# Patient Record
Sex: Female | Born: 1970 | Race: White | Hispanic: No | Marital: Married | Smoking: Never smoker
Health system: Southern US, Community
[De-identification: ages and names within clinical notes are randomized; demographics above are authoritative.]

## PROBLEM LIST (undated history)

## (undated) DIAGNOSIS — I341 Nonrheumatic mitral (valve) prolapse: Secondary | ICD-10-CM

## (undated) DIAGNOSIS — T884XXA Failed or difficult intubation, initial encounter: Secondary | ICD-10-CM

## (undated) DIAGNOSIS — G43909 Migraine, unspecified, not intractable, without status migrainosus: Secondary | ICD-10-CM

## (undated) DIAGNOSIS — Z973 Presence of spectacles and contact lenses: Secondary | ICD-10-CM

## (undated) DIAGNOSIS — Z87898 Personal history of other specified conditions: Secondary | ICD-10-CM

## (undated) DIAGNOSIS — I350 Nonrheumatic aortic (valve) stenosis: Secondary | ICD-10-CM

## (undated) HISTORY — DX: Personal history of other specified conditions: Z87.898

## (undated) HISTORY — DX: Migraine, unspecified, not intractable, without status migrainosus: G43.909

## (undated) HISTORY — DX: Nonrheumatic mitral (valve) prolapse: I34.1

## (undated) HISTORY — PX: TONSILLECTOMY: SUR1361

## (undated) HISTORY — PX: APPENDECTOMY: SHX54

---

## 2011-02-02 ENCOUNTER — Ambulatory Visit: Payer: Self-pay

## 2011-07-29 ENCOUNTER — Ambulatory Visit: Payer: Self-pay | Admitting: Family Medicine

## 2011-10-19 ENCOUNTER — Ambulatory Visit: Payer: Self-pay | Admitting: Internal Medicine

## 2014-08-17 ENCOUNTER — Ambulatory Visit: Payer: Self-pay | Admitting: Family Medicine

## 2014-08-21 ENCOUNTER — Ambulatory Visit: Payer: Self-pay | Admitting: Cardiovascular Disease

## 2014-08-29 ENCOUNTER — Ambulatory Visit (INDEPENDENT_AMBULATORY_CARE_PROVIDER_SITE_OTHER): Payer: BC Managed Care – PPO | Admitting: Cardiovascular Disease

## 2014-08-29 ENCOUNTER — Encounter: Payer: Self-pay | Admitting: Cardiovascular Disease

## 2014-08-29 ENCOUNTER — Encounter (INDEPENDENT_AMBULATORY_CARE_PROVIDER_SITE_OTHER): Payer: Self-pay

## 2014-08-29 VITALS — BP 118/80 | HR 89 | Ht 62.0 in | Wt 137.0 lb

## 2014-08-29 DIAGNOSIS — G43809 Other migraine, not intractable, without status migrainosus: Secondary | ICD-10-CM

## 2014-08-29 DIAGNOSIS — R011 Cardiac murmur, unspecified: Secondary | ICD-10-CM

## 2014-08-29 DIAGNOSIS — R0789 Other chest pain: Secondary | ICD-10-CM | POA: Diagnosis not present

## 2014-08-29 NOTE — Assessment & Plan Note (Signed)
Managed by primary care. 

## 2014-08-29 NOTE — Progress Notes (Signed)
Patient ID: [name], female    DOB: 11-22-1970, 44 y.o.   MRN: [id number]  HPI Comments: [name] is a pleasant 44 year old woman who presents by referral from Dr. Gavin PottersGrandis for episodes of chest pain.  She for that over the past month or so she has noticed chest pain symptoms, sometimes at rest, sometimes with exertion. Chest pain is in the central part of her chest. She has noticed chest pain while on the treadmill.  Also noticed chest discomfort while at work. She is on her feet for long periods of time, working as a Engineer, civil (consulting)nurse at [employer].  She denies any lower extremity edema, no PND, orthopnea, lightheadedness.  Nonsmoker, no diabetes. In terms of her family history, no parents or siblings with premature coronary disease.  EKG on today's visit shows normal sinus rhythm with rate 85 bpm, no significant ST or T-wave changes  No Known Allergies  Outpatient Encounter Prescriptions as of 08/29/2014  Medication Sig  . ibuprofen (ADVIL,MOTRIN) 800 MG tablet Take 800 mg by mouth every 8 (eight) hours as needed.  . Multiple Vitamins tablet Take 1 tablet by mouth daily.   . ondansetron (ZOFRAN-ODT) 8 MG disintegrating tablet Take 8 mg by mouth every 8 (eight) hours as needed.   . ranitidine (ZANTAC) 300 MG capsule Take 300 mg by mouth every evening.     Past Medical History  Diagnosis Date  . History of palpitations   . Mitral valve prolapse   . Migraine     Past Surgical History  Procedure Laterality Date  . Appendectomy    . Tonsillectomy    . Cesarean section      Social History  reports that she has never smoked. She does not have any smokeless tobacco history on file. She reports that she does not drink alcohol or use illicit drugs.  Family History family history includes Heart failure in her father.    Review of Systems  Constitutional: Negative.   Respiratory: Negative.   Cardiovascular: Positive for chest pain.  Gastrointestinal: Negative.    Musculoskeletal: Negative.   Skin: Negative.   Neurological: Negative.   Hematological: Negative.   Psychiatric/Behavioral: Negative.   All other systems reviewed and are negative.   BP 118/80 mmHg  Pulse 89  Ht 5\' 2"  (1.575 m)  Wt 137 lb (62.143 kg)  BMI 25.05 kg/m2  Physical Exam  Constitutional: She is oriented to person, place, and time. She appears well-developed and well-nourished.  HENT:  Head: Normocephalic.  Nose: Nose normal.  Mouth/Throat: Oropharynx is clear and moist.  Eyes: Conjunctivae are normal. Pupils are equal, round, and reactive to light.  Neck: Normal range of motion. Neck supple. No JVD present.  Cardiovascular: Normal rate, regular rhythm, S1 normal, S2 normal and intact distal pulses.  Exam reveals no gallop and no friction rub.   Murmur heard.  Systolic murmur is present with a grade of 1/6  Pulmonary/Chest: Effort normal and breath sounds normal. No respiratory distress. She has no wheezes. She has no rales. She exhibits no tenderness.  Abdominal: Soft. Bowel sounds are normal. She exhibits no distension. There is no tenderness.  Musculoskeletal: Normal range of motion. She exhibits no edema or tenderness.  Lymphadenopathy:    She has no cervical adenopathy.  Neurological: She is alert and oriented to person, place, and time. Coordination normal.  Skin: Skin is warm and dry. No rash noted. No erythema.  Psychiatric: She has a normal mood and affect. Her behavior is normal. Judgment  and thought content normal.    Assessment and Plan  Nursing note and vitals reviewed.

## 2014-08-29 NOTE — Procedures (Signed)
Exercise Treadmill Test  Treadmill ordered for recent epsiodes of chest pain.  Resting EKG shows NSR with rate of 97 bpm, significant ST or T-wave changes Resting blood pressure 141/85  Stand bruce protocal was used.  Patient exercised for 9 min 00 sec,  Peak heart rate of 157 bpm.  This was 89% of the maximum predicted heart rate (176). Achieved 10.1 METS No symptoms of chest pain or lightheadedness were reported at peak stress or in recovery.  Peak Blood pressure recorded wa149/74 Heart rate at 3 minutes in recovery was 117 bpm. No ST changes concerning for ischemia   FINAL IMPRESSION: Normal exercise stress test. No significant EKG changes concerning for ischemia. Excellent exercise tolerance.

## 2014-08-29 NOTE — Assessment & Plan Note (Signed)
Atypical chest pain though periodically with symptoms on exertion.  We discussed the various treatment options. We have recommended a routine treadmill study as she is low risk. We will do this on her visit today If treadmill study is normal, chest tightness likely from other etiology such as GI or musculoskeletal.

## 2014-08-29 NOTE — Patient Instructions (Signed)
You are doing well. No medication changes were made.  We will order a routine treadmill study for chest pain  Please call us if you have new issues that need to be addressed before your next appt.

## 2014-08-29 NOTE — Patient Instructions (Signed)
Stress test is essentially normal with good exercise tolerance, no ischemia Etiology of your chest pain is likely noncardiac Would follow up with primary care

## 2014-08-29 NOTE — Assessment & Plan Note (Signed)
There is a low-grade 1 to 2/6 murmur notable at the right sternal border. She does report a history of mitral valve prolapse 26 years ago. Unable to exclude bicuspid aortic valve  echocardiogram order has been placed and she will do this at her convenience for new baseline

## 2014-09-05 ENCOUNTER — Telehealth: Payer: Self-pay

## 2014-09-05 NOTE — Telephone Encounter (Signed)
LMOM TO SEE IF PT WOULD LIKE TO SCHEDULE ECHO

## 2014-09-26 ENCOUNTER — Other Ambulatory Visit: Payer: BC Managed Care – PPO

## 2014-10-16 ENCOUNTER — Ambulatory Visit (INDEPENDENT_AMBULATORY_CARE_PROVIDER_SITE_OTHER): Payer: BC Managed Care – PPO

## 2014-10-16 ENCOUNTER — Other Ambulatory Visit: Payer: BC Managed Care – PPO

## 2014-10-16 ENCOUNTER — Other Ambulatory Visit: Payer: Self-pay

## 2014-10-16 DIAGNOSIS — R0789 Other chest pain: Secondary | ICD-10-CM

## 2014-10-16 DIAGNOSIS — R011 Cardiac murmur, unspecified: Secondary | ICD-10-CM | POA: Diagnosis not present

## 2016-04-06 ENCOUNTER — Observation Stay
Admission: EM | Admit: 2016-04-06 | Discharge: 2016-04-08 | Disposition: A | Discharge status: Home / Self Care | Payer: BC Managed Care – PPO | Attending: Internal Medicine | Admitting: Internal Medicine

## 2016-04-06 DIAGNOSIS — G43909 Migraine, unspecified, not intractable, without status migrainosus: Secondary | ICD-10-CM | POA: Insufficient documentation

## 2016-04-06 DIAGNOSIS — R1010 Upper abdominal pain, unspecified: Secondary | ICD-10-CM

## 2016-04-06 DIAGNOSIS — R110 Nausea: Secondary | ICD-10-CM

## 2016-04-06 DIAGNOSIS — R000 Tachycardia, unspecified: Secondary | ICD-10-CM | POA: Diagnosis not present

## 2016-04-06 DIAGNOSIS — R1013 Epigastric pain: Principal | ICD-10-CM

## 2016-04-06 DIAGNOSIS — E860 Dehydration: Secondary | ICD-10-CM | POA: Insufficient documentation

## 2016-04-06 DIAGNOSIS — R52 Pain, unspecified: Secondary | ICD-10-CM

## 2016-04-06 DIAGNOSIS — I341 Nonrheumatic mitral (valve) prolapse: Secondary | ICD-10-CM | POA: Insufficient documentation

## 2016-04-06 LAB — COMPREHENSIVE METABOLIC PANEL
ALBUMIN: 4 g/dL (ref 3.5–5.0)
ALT: 14 U/L (ref 14–54)
ANION GAP: 7 (ref 5–15)
AST: 24 U/L (ref 15–41)
Alkaline Phosphatase: 49 U/L (ref 38–126)
BUN: 6 mg/dL (ref 6–20)
CHLORIDE: 106 mmol/L (ref 101–111)
CO2: 24 mmol/L (ref 22–32)
Calcium: 8.8 mg/dL — ABNORMAL LOW (ref 8.9–10.3)
Creatinine, Ser: 0.57 mg/dL (ref 0.44–1.00)
GFR calc Af Amer: >60 mL/min (ref >60)
GFR calc non Af Amer: >60 mL/min (ref >60)
GLUCOSE: 116 mg/dL — AB (ref 65–99)
POTASSIUM: 3.3 mmol/L — AB (ref 3.5–5.1)
SODIUM: 137 mmol/L (ref 135–145)
TOTAL PROTEIN: 7.5 g/dL (ref 6.5–8.1)
Total Bilirubin: 0.4 mg/dL (ref 0.3–1.2)

## 2016-04-06 LAB — POCT PREGNANCY, URINE: PREG TEST UR: NEGATIVE

## 2016-04-06 LAB — CBC
HEMATOCRIT: 40.3 % (ref 35.0–47.0)
HEMOGLOBIN: 14.2 g/dL (ref 12.0–16.0)
MCH: 30.4 pg (ref 26.0–34.0)
MCHC: 35.3 g/dL (ref 32.0–36.0)
MCV: 86.2 fL (ref 80.0–100.0)
Platelets: 274 10*3/uL (ref 150–440)
RBC: 4.67 MIL/uL (ref 3.80–5.20)
RDW: 12.7 % (ref 11.5–14.5)
WBC: 8.9 10*3/uL (ref 3.6–11.0)

## 2016-04-06 LAB — URINALYSIS COMPLETE WITH MICROSCOPIC (ARMC ONLY)
Bilirubin Urine: NEGATIVE
Glucose, UA: NEGATIVE mg/dL
Ketones, ur: NEGATIVE mg/dL
Leukocytes, UA: NEGATIVE
NITRITE: NEGATIVE
PROTEIN: NEGATIVE mg/dL
SPECIFIC GRAVITY, URINE: 1.003 — AB (ref 1.005–1.030)
pH: 6 (ref 5.0–8.0)

## 2016-04-06 LAB — LIPASE, BLOOD: LIPASE: 22 U/L (ref 11–51)

## 2016-04-06 MED ORDER — ONDANSETRON HCL 4 MG/2ML IJ SOLN
4.0000 mg | Freq: Once | INTRAMUSCULAR | Status: AC
  Administered 2016-04-07: 4 mg via INTRAVENOUS
  Filled 2016-04-06: qty 2

## 2016-04-06 MED ORDER — MORPHINE SULFATE (PF) 2 MG/ML IV SOLN
4.0000 mg | Freq: Once | INTRAVENOUS | Status: AC
Start: 2016-04-06 — End: 2016-04-07
  Administered 2016-04-07: 4 mg via INTRAVENOUS
  Filled 2016-04-06: qty 2

## 2016-04-06 MED ORDER — SODIUM CHLORIDE 0.9 % IV BOLUS (SEPSIS)
1000.0000 mL | Freq: Once | INTRAVENOUS | Status: AC
  Administered 2016-04-07: 1000 mL via INTRAVENOUS

## 2016-04-06 NOTE — ED Triage Notes (Signed)
Patient reports abdominal pain nausea since Friday.

## 2016-04-06 NOTE — ED Notes (Signed)
Pt reports she just voided prior to coming to ED. UA cup at bedside. Pt aware of need for sample.

## 2016-04-07 ENCOUNTER — Observation Stay: Payer: BC Managed Care – PPO | Admitting: Anesthesiology

## 2016-04-07 ENCOUNTER — Emergency Department: Payer: BC Managed Care – PPO

## 2016-04-07 ENCOUNTER — Encounter
Admission: EM | Disposition: A | Discharge status: Home / Self Care | Payer: Self-pay | Source: Home / Self Care | Attending: Emergency Medicine

## 2016-04-07 DIAGNOSIS — R000 Tachycardia, unspecified: Secondary | ICD-10-CM | POA: Diagnosis present

## 2016-04-07 DIAGNOSIS — R1013 Epigastric pain: Secondary | ICD-10-CM | POA: Diagnosis not present

## 2016-04-07 DIAGNOSIS — R110 Nausea: Secondary | ICD-10-CM

## 2016-04-07 HISTORY — PX: ESOPHAGOGASTRODUODENOSCOPY (EGD) WITH PROPOFOL: SHX5813

## 2016-04-07 LAB — TSH: TSH: 1.657 u[IU]/mL (ref 0.350–4.500)

## 2016-04-07 LAB — LACTIC ACID, PLASMA: Lactic Acid, Venous: 0.7 mmol/L (ref 0.5–1.9)

## 2016-04-07 SURGERY — ESOPHAGOGASTRODUODENOSCOPY (EGD) WITH PROPOFOL
Anesthesia: General

## 2016-04-07 MED ORDER — SODIUM CHLORIDE 0.9% FLUSH
3.0000 mL | Freq: Two times a day (BID) | INTRAVENOUS | Status: DC
  Administered 2016-04-07 (×2): 3 mL via INTRAVENOUS

## 2016-04-07 MED ORDER — HYDROMORPHONE HCL 1 MG/ML IJ SOLN: 0.5000 mg | INTRAMUSCULAR | Status: DC | PRN

## 2016-04-07 MED ORDER — PROMETHAZINE HCL 25 MG PO TABS
12.5000 mg | ORAL_TABLET | Freq: Four times a day (QID) | ORAL | Status: DC | PRN
  Administered 2016-04-07: 12.5 mg via ORAL
  Filled 2016-04-07: qty 1

## 2016-04-07 MED ORDER — IOPAMIDOL (ISOVUE-300) INJECTION 61%
100.0000 mL | Freq: Once | INTRAVENOUS | Status: AC | PRN
  Administered 2016-04-07: 100 mL via INTRAVENOUS

## 2016-04-07 MED ORDER — POTASSIUM CHLORIDE IN NACL 40-0.9 MEQ/L-% IV SOLN
INTRAVENOUS | Status: DC
  Administered 2016-04-07 – 2016-04-08 (×4): 125 mL/h via INTRAVENOUS
  Filled 2016-04-07 (×6): qty 1000

## 2016-04-07 MED ORDER — ADULT MULTIVITAMIN W/MINERALS CH
1.0000 | ORAL_TABLET | Freq: Every day | ORAL | Status: DC
  Administered 2016-04-08: 1 via ORAL
  Filled 2016-04-07 (×2): qty 1

## 2016-04-07 MED ORDER — PROPOFOL 500 MG/50ML IV EMUL
INTRAVENOUS | Status: DC | PRN
  Administered 2016-04-07: 140 ug/kg/min via INTRAVENOUS

## 2016-04-07 MED ORDER — MORPHINE SULFATE (PF) 2 MG/ML IV SOLN
4.0000 mg | Freq: Once | INTRAVENOUS | Status: AC
  Administered 2016-04-07: 4 mg via INTRAVENOUS
  Filled 2016-04-07: qty 2

## 2016-04-07 MED ORDER — FAMOTIDINE 20 MG PO TABS
40.0000 mg | ORAL_TABLET | Freq: Every day | ORAL | Status: DC
Start: 2016-04-07 — End: 2016-04-07
  Filled 2016-04-07: qty 2

## 2016-04-07 MED ORDER — ONDANSETRON HCL 4 MG/2ML IJ SOLN
4.0000 mg | Freq: Four times a day (QID) | INTRAMUSCULAR | Status: DC | PRN
  Administered 2016-04-07 (×2): 4 mg via INTRAVENOUS
  Filled 2016-04-07: qty 2

## 2016-04-07 MED ORDER — FENTANYL CITRATE (PF) 100 MCG/2ML IJ SOLN
INTRAMUSCULAR | Status: DC | PRN
  Administered 2016-04-07: 50 ug via INTRAVENOUS

## 2016-04-07 MED ORDER — METRONIDAZOLE 500 MG PO TABS
500.0000 mg | ORAL_TABLET | Freq: Three times a day (TID) | ORAL | Status: AC
  Administered 2016-04-07 – 2016-04-08 (×3): 500 mg via ORAL
  Filled 2016-04-07 (×3): qty 1

## 2016-04-07 MED ORDER — HYDROMORPHONE HCL 1 MG/ML IJ SOLN
0.5000 mg | INTRAMUSCULAR | Status: DC | PRN
  Administered 2016-04-07: 14:00:00 0.5 mg via INTRAVENOUS
  Filled 2016-04-07: qty 1

## 2016-04-07 MED ORDER — ENOXAPARIN SODIUM 40 MG/0.4ML ~~LOC~~ SOLN
40.0000 mg | SUBCUTANEOUS | Status: DC
  Filled 2016-04-07: qty 0.4

## 2016-04-07 MED ORDER — ONDANSETRON HCL 4 MG/2ML IJ SOLN
INTRAMUSCULAR | Status: AC
  Filled 2016-04-07: qty 2

## 2016-04-07 MED ORDER — CIPROFLOXACIN IN D5W 400 MG/200ML IV SOLN
400.0000 mg | Freq: Once | INTRAVENOUS | Status: AC
  Administered 2016-04-07: 400 mg via INTRAVENOUS
  Filled 2016-04-07: qty 200

## 2016-04-07 MED ORDER — FAMOTIDINE IN NACL 20-0.9 MG/50ML-% IV SOLN
20.0000 mg | Freq: Two times a day (BID) | INTRAVENOUS | Status: DC
  Administered 2016-04-07 – 2016-04-08 (×3): 20 mg via INTRAVENOUS
  Filled 2016-04-07 (×4): qty 50

## 2016-04-07 MED ORDER — ONDANSETRON HCL 4 MG/2ML IJ SOLN
4.0000 mg | Freq: Once | INTRAMUSCULAR | Status: AC
  Administered 2016-04-07: 4 mg via INTRAVENOUS
  Filled 2016-04-07: qty 2

## 2016-04-07 MED ORDER — IOPAMIDOL (ISOVUE-300) INJECTION 61%
30.0000 mL | Freq: Once | INTRAVENOUS | Status: AC | PRN
  Administered 2016-04-07: 30 mL via ORAL

## 2016-04-07 MED ORDER — DOCUSATE SODIUM 100 MG PO CAPS
100.0000 mg | ORAL_CAPSULE | Freq: Two times a day (BID) | ORAL | Status: DC
  Administered 2016-04-07: 21:00:00 100 mg via ORAL
  Filled 2016-04-07 (×3): qty 1

## 2016-04-07 MED ORDER — SODIUM CHLORIDE 0.9 % IV BOLUS (SEPSIS)
1000.0000 mL | Freq: Once | INTRAVENOUS | Status: AC
  Administered 2016-04-07: 1000 mL via INTRAVENOUS

## 2016-04-07 MED ORDER — MIDAZOLAM HCL 2 MG/2ML IJ SOLN
INTRAMUSCULAR | Status: DC | PRN
  Administered 2016-04-07: 1 mg via INTRAVENOUS

## 2016-04-07 MED ORDER — BUTALBITAL-APAP-CAFFEINE 50-325-40 MG PO TABS
1.0000 | ORAL_TABLET | ORAL | Status: DC | PRN
  Administered 2016-04-07 (×2): 1 via ORAL
  Filled 2016-04-07 (×4): qty 1

## 2016-04-07 MED ORDER — ACETAMINOPHEN 650 MG RE SUPP
650.0000 mg | Freq: Four times a day (QID) | RECTAL | Status: DC | PRN
Start: 2016-04-07 — End: 2016-04-08

## 2016-04-07 MED ORDER — ONDANSETRON HCL 4 MG PO TABS: 4.0000 mg | ORAL_TABLET | Freq: Four times a day (QID) | ORAL | Status: DC | PRN

## 2016-04-07 MED ORDER — FAMOTIDINE IN NACL 20-0.9 MG/50ML-% IV SOLN
20.0000 mg | Freq: Once | INTRAVENOUS | Status: AC
  Administered 2016-04-07: 20 mg via INTRAVENOUS
  Filled 2016-04-07: qty 50

## 2016-04-07 MED ORDER — ACETAMINOPHEN 325 MG PO TABS: 650.0000 mg | ORAL_TABLET | Freq: Four times a day (QID) | ORAL | Status: DC | PRN

## 2016-04-07 MED ORDER — SODIUM CHLORIDE 0.9 % IV SOLN
INTRAVENOUS | Status: DC
  Administered 2016-04-07: 1000 mL via INTRAVENOUS

## 2016-04-07 MED ORDER — HYDROMORPHONE HCL 1 MG/ML IJ SOLN
INTRAMUSCULAR | Status: AC
  Filled 2016-04-07: qty 1

## 2016-04-07 MED ORDER — HYDROMORPHONE HCL 1 MG/ML IJ SOLN
0.5000 mg | Freq: Once | INTRAMUSCULAR | Status: AC
  Administered 2016-04-07: 0.5 mg via INTRAVENOUS

## 2016-04-07 MED ORDER — ONDANSETRON HCL 4 MG/2ML IJ SOLN
4.0000 mg | Freq: Once | INTRAMUSCULAR | Status: AC
  Administered 2016-04-07: 4 mg via INTRAVENOUS

## 2016-04-07 MED ORDER — CIPROFLOXACIN HCL 500 MG PO TABS
500.0000 mg | ORAL_TABLET | Freq: Two times a day (BID) | ORAL | Status: AC
  Administered 2016-04-07 – 2016-04-08 (×2): 500 mg via ORAL
  Filled 2016-04-07 (×2): qty 1

## 2016-04-07 MED ORDER — METRONIDAZOLE IN NACL 5-0.79 MG/ML-% IV SOLN
500.0000 mg | Freq: Once | INTRAVENOUS | Status: AC
  Administered 2016-04-07: 07:00:00 500 mg via INTRAVENOUS
  Filled 2016-04-07: qty 100

## 2016-04-07 MED ORDER — LIDOCAINE HCL (CARDIAC) 20 MG/ML IV SOLN
INTRAVENOUS | Status: DC | PRN
  Administered 2016-04-07: 60 mg via INTRAVENOUS

## 2016-04-07 NOTE — Transfer of Care (Signed)
Immediate Anesthesia Transfer of Care Note  Patient: [name]  Procedure(s) Performed: Procedure(s): ESOPHAGOGASTRODUODENOSCOPY (EGD) WITH PROPOFOL (N/A)  Patient Location: PACU  Anesthesia Type:General  Level of Consciousness: sedated  Airway & Oxygen Therapy: Patient Spontanous Breathing and Patient connected to nasal cannula oxygen  Post-op Assessment: Report given to RN and Post -op Vital signs reviewed and stable  Post vital signs: Reviewed and stable  Last Vitals:  Vitals:   04/07/16 0547 04/07/16 1058  BP: 109/60 125/69  Pulse: (!) 108 (!) 106  Resp: 18 16  Temp: 36.8 C 37.7 C    Last Pain:  Vitals:   04/07/16 1058  TempSrc: Tympanic  PainSc:          Complications: No apparent anesthesia complications

## 2016-04-07 NOTE — Op Note (Signed)
[facility] Gastroenterology Patient Name: Leslie Pollocklizabeth Mack Procedure Date: 04/07/2016 11:14 AM MRN: [id number] Account #: [id number] Date of Birth: 10-14-1970 Admit Type: Inpatient Age: 45 [facility] ENDO ROOM 2 Gender: Female Note Status: Finalized Procedure:            Upper GI endoscopy Indications:          Epigastric abdominal pain Providers:            Midge Miniumarren Cleda Imel MD, MD Referring MD:         Letitia CaulHeidi M. Grandis, MD (Referring MD) Medicines:            Propofol per Anesthesia Complications:        No immediate complications. Procedure:            Pre-Anesthesia Assessment:                       - Prior to the procedure, a History and Physical was                        performed, and patient medications and allergies were                        reviewed. The patient's tolerance of previous                        anesthesia was also reviewed. The risks and benefits of                        the procedure and the sedation options and risks were                        discussed with the patient. All questions were                        answered, and informed consent was obtained. Prior                        Anticoagulants: The patient has taken no previous                        anticoagulant or antiplatelet agents. ASA Grade                        Assessment: II - A patient with mild systemic disease.                        After reviewing the risks and benefits, the patient was                        deemed in satisfactory condition to undergo the                        procedure.                       After obtaining informed consent, the endoscope was                        passed under direct vision. Throughout the procedure,  the patient's blood pressure, pulse, and oxygen                        saturations were monitored continuously. The                        Colonoscope was introduced through the mouth, and        advanced to the second part of duodenum. The upper GI                        endoscopy was accomplished without difficulty. The                        patient tolerated the procedure well. Findings:      The examined esophagus was normal.      The stomach was normal.      The examined duodenum was normal. Impression:           - Normal esophagus.                       - Normal stomach.                       - Normal examined duodenum.                       - No specimens collected. Recommendation:       - Return patient to hospital ward for ongoing care. Procedure Code(s):    --- Professional ---                       [phone], Esophagogastroduodenoscopy, flexible, transoral;                        diagnostic, including collection of specimen(s) by                        brushing or washing, when performed (separate procedure) Diagnosis Code(s):    --- Professional ---                       R10.13, Epigastric pain CPT copyright 2016 American Medical Association. All rights reserved. The codes documented in this report are preliminary and upon coder review may  be revised to meet current compliance requirements. Midge Miniumarren Deaja Rizo MD, MD 04/07/2016 11:35:49 AM This report has been signed electronically. Number of Addenda: 0 Note Initiated On: 04/07/2016 11:14 AM      [facility]

## 2016-04-07 NOTE — ED Provider Notes (Signed)
[facility] Emergency Department Provider Note   ____________________________________________   First MD Initiated Contact with Patient 04/06/16 2317     (approximate)  I have reviewed the triage vital signs and the nursing notes.   HISTORY  Chief Complaint Abdominal Pain    HPI [name] is a 45 y.o. female who presents to the ED from home with a chief complaint of abdominal pain and nausea. Patient reports onset of upper abdominal cramping like pain 3 days ago. Pain is waxing/waning and does not seem to be exacerbated by anything. Symptoms are associated with nausea only. Patient denies recent fever, chills, chest pain, shortness of breath, vomiting, dysuria, diarrhea. Denies recent travel or trauma. Nothing makes her symptoms better or worse.   Past Medical History:  Diagnosis Date  . History of palpitations   . Migraine   . Mitral valve prolapse     Patient Active Problem List   Diagnosis Date Noted  . Tachycardia 04/07/2016  . Chest pressure 08/29/2014  . Murmur 08/29/2014  . Other migraine without status migrainosus, not intractable 08/29/2014    Past Surgical History:  Procedure Laterality Date  . APPENDECTOMY    . CESAREAN SECTION    . TONSILLECTOMY      Prior to Admission medications   Medication Sig Start Date End Date Taking? Authorizing Provider  acetaminophen (TYLENOL) 325 MG tablet Take 650 mg by mouth every 6 (six) hours as needed for mild pain or headache.   Yes Historical Provider, MD  ibuprofen (ADVIL,MOTRIN) 200 MG tablet Take 400 mg by mouth every 6 (six) hours as needed for headache or mild pain.   Yes Historical Provider, MD  Multiple Vitamins tablet Take 1 tablet by mouth daily.    Yes Historical Provider, MD  ondansetron (ZOFRAN-ODT) 8 MG disintegrating tablet Take 8 mg by mouth every 8 (eight) hours as needed for nausea or vomiting.  08/04/14  Yes Historical Provider, MD  rizatriptan (MAXALT) 10 MG tablet  Take 10 mg by mouth as needed for migraine. May repeat in 2 hours if needed   Yes Historical Provider, MD    Allergies Patient has no known allergies.  Family History  Problem Relation Age of Onset  . Heart failure Father     Social History Social History  Substance Use Topics  . Smoking status: Never Smoker  . Smokeless tobacco: Never Used  . Alcohol use No    Review of Systems  Constitutional: No fever/chills. Eyes: No visual changes. ENT: No sore throat. Cardiovascular: Denies chest pain. Respiratory: Denies shortness of breath. Gastrointestinal: Positive for abdominal pain and nausea, no vomiting.  No diarrhea.  No constipation. Genitourinary: Negative for dysuria. Musculoskeletal: Negative for back pain. Skin: Negative for rash. Neurological: Negative for headaches, focal weakness or numbness.  10-point ROS otherwise negative.  ____________________________________________   PHYSICAL EXAM:  VITAL SIGNS: ED Triage Vitals  Enc Vitals Group     BP 04/06/16 2150 133/66     Pulse Rate 04/06/16 2150 (!) 118     Resp 04/06/16 2150 20     Temp 04/06/16 2150 98.9 F (37.2 C)     Temp Source 04/06/16 2150 Oral     SpO2 04/06/16 2150 98 %     Weight 04/06/16 2149 126 lb (57.2 kg)     Height 04/06/16 2149 5\' 2"  (1.575 m)     Head Circumference --      Peak Flow --      Pain Score 04/06/16  2150 6     Pain Loc --      Pain Edu? --      Excl. in GC? --     Constitutional: Alert and oriented. Well appearing and in no acute distress. Eyes: Conjunctivae are normal. PERRL. EOMI. Head: Atraumatic. Nose: No congestion/rhinnorhea. Mouth/Throat: Mucous membranes are moist.  Oropharynx non-erythematous. Neck: No stridor.   Cardiovascular: Tachycardic rate, regular rhythm. Grossly normal heart sounds.  Good peripheral circulation. Respiratory: Normal respiratory effort.  No retractions. Lungs CTAB. Gastrointestinal: Soft and mildly tender to palpation upper abdomen  without rebound or guarding. No distention. No abdominal bruits. No CVA tenderness. Musculoskeletal: No lower extremity tenderness nor edema.  No joint effusions. Neurologic:  Normal speech and language. No gross focal neurologic deficits are appreciated. No gait instability. Skin:  Skin is warm, dry and intact. No rash noted. Psychiatric: Mood and affect are normal. Speech and behavior are normal.  ____________________________________________   LABS (all labs ordered are listed, but only abnormal results are displayed)  Labs Reviewed  COMPREHENSIVE METABOLIC PANEL - Abnormal; Notable for the following:       Result Value   Potassium 3.3 (*)    Glucose, Bld 116 (*)    Calcium 8.8 (*)    All other components within normal limits  URINALYSIS COMPLETEWITH MICROSCOPIC ([facility] ONLY) - Abnormal; Notable for the following:    Color, Urine STRAW (*)    APPearance CLEAR (*)    Specific Gravity, Urine 1.003 (*)    Hgb urine dipstick 3+ (*)    Bacteria, UA RARE (*)    Squamous Epithelial / LPF 0-5 (*)    All other components within normal limits  LIPASE, BLOOD  CBC  LACTIC ACID, PLASMA  TSH  HEMOGLOBIN A1C  POC URINE PREG, ED  POCT PREGNANCY, URINE   ____________________________________________  EKG  ED ECG REPORT I, Jun Rightmyer J, the attending physician, personally viewed and interpreted this ECG.   Date: 04/07/2016  EKG Time: 2325  Rate: 100  Rhythm: sinus tachycardia  Axis: Normal  Intervals:none  ST&T Change: Nonspecific  ____________________________________________  RADIOLOGY  Ultrasound abdomen limited RUQ interpreted per Dr. Sterling Big: Unremarkable right upper quadrant abdominal ultrasound.  CT abdomen and pelvis with contrast interpreted per Dr. Sterling Big: Short segmental areas of jejunal thickening could be due to  contraction and/or underdistention. Mild enteritis is not entirely  excluded. Moderate colonic stool burden without bowel obstruction.    ____________________________________________   PROCEDURES  Procedure(s) performed: None  Procedures  Critical Care performed: No  ____________________________________________   INITIAL IMPRESSION / ASSESSMENT AND PLAN / ED COURSE  Pertinent labs & imaging results that were available during my care of the patient were reviewed by me and considered in my medical decision making (see chart for details).  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 Initial lab work including LFTs and lipase are unremarkable. Will proceed with limited RUQ ultrasound to evaluate for cholecystitis. Will initiate IV fluid resuscitation and IV analgesia.  Clinical Course as of Apr 07 630  [facility] [place] Apr 07, 2016  0153 Awaiting ultrasound results. Updated patient and spouse of laboratory results. Complains of persistent pain. Will redose morphine and Zofran.  [JS]  0218 Updated patient and spouse of negative ultrasound results. Will proceed to CT abdomen/pelvis to evaluate etiology of patient's abdominal pain.  [JS]  0424 Patient tachycardic to 120. Afebrile. Complains of persistent pain and nausea. Updated  patient and spouse of CT imaging results. Will empirically cover patient for intra-abdominal infection. Discuss with hospitalist to evaluate patient in the emergency department for admission.  [JS]    Clinical Course User Index [JS] Irean HongJade J Pricila Bridge, MD     ____________________________________________   FINAL CLINICAL IMPRESSION(S) / ED DIAGNOSES  Final diagnoses:  Pain of upper abdomen  Intractable pain  Nausea  Tachycardia      NEW MEDICATIONS STARTED DURING THIS VISIT:  Current Discharge Medication List       Note:  This document was prepared using Dragon voice recognition software and may include unintentional dictation errors.    Irean HongJade J Malcom Selmer, MD 04/07/16 [phone]

## 2016-04-07 NOTE — Progress Notes (Addendum)
  For nausea, epigastric pain, tachycardia. Started on IV fluids with potassium. Still has epigastric pain. No nausea. Started on Flagyl IV for possible enteritis that is seen on the CAT scan. Patient requesting GI evaluation so she is nothing by mouth because of that. Requesting Fioricet for headaches. Willing to start clear liquids if no plan for EGD by GI. Time  spent 20 minutes.  Reviewed the medications, labs, imaging. Spoke with GI Dr.Darren wohl,he said EGD suite is not available till 5 pm.he  Will see her and decide if she needs EGD,but recommended NPO ,just in case if he thinks he needs,.

## 2016-04-07 NOTE — Anesthesia Preprocedure Evaluation (Signed)
Anesthesia Evaluation  Patient identified by MRN, date of birth, ID band Patient awake    Reviewed: Allergy & Precautions, NPO status , Patient's Chart, lab work & pertinent test results  History of Anesthesia Complications Negative for: history of anesthetic complications  Airway Mallampati: II       Dental  (+) Partial Upper   Pulmonary neg pulmonary ROS,           Cardiovascular (-) hypertension(-) CAD and (-) Past MI + Valvular Problems/Murmurs MVP      Neuro/Psych negative neurological ROS     GI/Hepatic negative GI ROS, Neg liver ROS,   Endo/Other  negative endocrine ROS  Renal/GU negative Renal ROS     Musculoskeletal   Abdominal   Peds  Hematology negative hematology ROS (+)   Anesthesia Other Findings   Reproductive/Obstetrics                             Anesthesia Physical Anesthesia Plan  ASA: II  Anesthesia Plan: General   Post-op Pain Management:    Induction:   Airway Management Planned:   Additional Equipment:   Intra-op Plan:   Post-operative Plan:   Informed Consent: I have reviewed the patients History and Physical, chart, labs and discussed the procedure including the risks, benefits and alternatives for the proposed anesthesia with the patient or authorized representative who has indicated his/her understanding and acceptance.     Plan Discussed with:   Anesthesia Plan Comments:         Anesthesia Quick Evaluation

## 2016-04-07 NOTE — Consult Note (Signed)
Leslie Miniumarren Jasdeep Kepner, [name]  [address] [place], [place] [address] Phone: [phone] Fax : [phone]  Consultation  Referring Provider:     No ref. provider found Primary Care Physician:  [name], HEIDI, [name] Primary Gastroenterologist:           Reason for Consultation:     Abdominal pain  Date of Admission:  04/06/2016 Date of Consultation:  04/07/2016         HPI:   [name] is a 45 y.o. female this patient comes in today because she reports that she started having abdominal pain 3 days ago. The patient reports her abdominal pain started Friday night after she ate a big meal and just had some fullness and pressure. The following day the patient had some chili and states that her abdominal pain got worse. There is no report of any vomiting but the patient does state that she was nauseated. She did not eat anything else for the rest the day and then was in so much pain that she came to the hospital. The patient states she has had decrease in her appetite. She has been constipated without any signs of diarrhea but she does report that she feels that if she passed some gas she would feel better. When the pain started she states it was an 8 or 9 out of 10 but is only a 3 or 4 this morning. The patient had a CT scan with possible enteritis and a large stool burden. The patient also takes anti-inflammatory medication for chronic headaches. She states she takes these approximately 4 times a week  Past Medical History:  Diagnosis Date  . History of palpitations   . Migraine   . Mitral valve prolapse     Past Surgical History:  Procedure Laterality Date  . APPENDECTOMY    . CESAREAN SECTION    . TONSILLECTOMY      Prior to Admission medications   Medication Sig Start Date End Date Taking? Authorizing Provider  acetaminophen (TYLENOL) 325 MG tablet Take 650 mg by mouth every 6 (six) hours as needed for mild pain or headache.   Yes Historical Provider, [name]  ibuprofen  (ADVIL,MOTRIN) 200 MG tablet Take 400 mg by mouth every 6 (six) hours as needed for headache or mild pain.   Yes Historical Provider, [name]  Multiple Vitamins tablet Take 1 tablet by mouth daily.    Yes Historical Provider, [name]  ondansetron (ZOFRAN-ODT) 8 MG disintegrating tablet Take 8 mg by mouth every 8 (eight) hours as needed for nausea or vomiting.  08/04/14  Yes Historical Provider, [name]  rizatriptan (MAXALT) 10 MG tablet Take 10 mg by mouth as needed for migraine. May repeat in 2 hours if needed   Yes Historical Provider, [name]    Family History  Problem Relation Age of Onset  . Heart failure Father      Social History  Substance Use Topics  . Smoking status: Never Smoker  . Smokeless tobacco: Never Used  . Alcohol use No    Allergies as of 04/06/2016  . (No Known Allergies)    Review of Systems:    All systems reviewed and negative except where noted in HPI.   Physical Exam:  Vital signs in last 24 hours: Temp:  [98.3 F (36.8 C)-99.8 F (37.7 C)] 98.5 F (36.9 C) (11/06 1240) Pulse Rate:  [91-119] 99 (11/06 1240) Resp:  [12-31] 18 (11/06 1240) BP: (103-133)/(60-86) 123/62 (11/06 1240) SpO2:  [97 %-100 %] 99 % (  11/06 1240) Weight:  [126 lb (57.2 kg)-126 lb 7 oz (57.4 kg)] 126 lb 7 oz (57.4 kg) (11/06 0549) Last BM Date: 04/06/16 General:   Pleasant, cooperative in NAD Head:  Normocephalic and atraumatic. Eyes:   No icterus.   Conjunctiva pink. PERRLA. Ears:  Normal auditory acuity. Neck:  Supple; no masses or thyroidomegaly Lungs: Respirations even and unlabored. Lungs clear to auscultation bilaterally.   No wheezes, crackles, or rhonchi.  Heart:  Regular rate and rhythm;  Without murmur, clicks, rubs or gallops Abdomen:  Soft, nondistended, nontender. Normal bowel sounds. No appreciable masses or hepatomegaly.  No rebound or guarding.  Rectal:  Not performed. Msk:  Symmetrical without gross deformities.    Extremities:  Without edema, cyanosis or clubbing. Neurologic:   Alert and oriented x3;  grossly normal neurologically. Skin:  Intact without significant lesions or rashes. Cervical Nodes:  No significant cervical adenopathy. Psych:  Alert and cooperative. Normal affect.  LAB RESULTS:  Recent Labs  04/06/16 2152  WBC 8.9  HGB 14.2  HCT 40.3  PLT 274   BMET  Recent Labs  04/06/16 2152  NA 137  K 3.3*  CL 106  CO2 24  GLUCOSE 116*  BUN 6  CREATININE 0.57  CALCIUM 8.8*   LFT  Recent Labs  04/06/16 2152  PROT 7.5  ALBUMIN 4.0  AST 24  ALT 14  ALKPHOS 49  BILITOT 0.4   PT/INR No results for input(s): LABPROT, INR in the last 72 hours.  STUDIES: Ct Abdomen Pelvis W Contrast  Result Date: 04/07/2016 CLINICAL DATA:  Acute upper mid abdominal pain worsening with inspiration. Appendectomy and cesarean section history. EXAM: CT ABDOMEN AND PELVIS WITH CONTRAST TECHNIQUE: Multidetector CT imaging of the abdomen and pelvis was performed using the standard protocol following bolus administration of intravenous contrast. CONTRAST:  100mL ISOVUE-300 IOPAMIDOL (ISOVUE-300) INJECTION 61% COMPARISON:  Abdominal ultrasound from earlier on the same day FINDINGS: LOWER CHEST: Lung bases are clear. Minimal bibasilar dependent atelectasis. Included heart size is normal. No pericardial effusion. HEPATOBILIARY: Liver enhances homogeneously. Minimal left hepatic intraductal dilatation. Distended appearing gallbladder without wall thickening. These findings could be due to a fasting state. PANCREAS: Normal. SPLEEN: Normal. ADRENALS/URINARY TRACT: Kidneys are orthotopic, demonstrating symmetric enhancement. No nephrolithiasis, hydronephrosis or solid renal masses. The unopacified ureters are normal in course and caliber. Delayed imaging through the kidneys demonstrates symmetric prompt contrast excretion within the proximal urinary collecting system. Urinary bladder is partially distended and unremarkable. Normal adrenal glands. STOMACH/BOWEL: The stomach, small  and large bowel are normal in course. Short segmental areas of jejunal thickening could be due to contraction and/or under distention. Enteritis is not entirely excluded. Moderate colonic stool burden is seen. Appendectomy by report. VASCULAR/LYMPHATIC: Aortoiliac vessels are normal in course and caliber. No lymphadenopathy by CT size criteria. REPRODUCTIVE: Heterogeneous enhancement of retroverted uterus. Nabothian cysts are suggested at the cervix. OTHER: No intraperitoneal free fluid or free air. MUSCULOSKELETAL: Nonacute. Slight disc space narrowing at L5-S1 with mild left neural foraminal encroachment from osteophyte. IMPRESSION: Short segmental areas of jejunal thickening could be due to contraction and/or underdistention. Mild enteritis is not entirely excluded. Moderate colonic stool burden without bowel obstruction. Electronically Signed   By: Tollie Ethavid  Kwon M.D.   On: 04/07/2016 04:03   [place] Abdomen Limited Ruq  Result Date: 04/07/2016 CLINICAL DATA:  Epigastric pain and nausea. Upper abdominal pain for 2 days. EXAM: US ABDOMEN LIMITED - RIGHT UPPER QUADRANT COMPARISON:  None. FINDINGS: Gallbladder: No gallstones or wall  thickening visualized. No sonographic Murphy sign noted by sonographer. Common bile duct: Diameter: 4 mm Liver: No focal lesion identified. Within normal limits in parenchymal echogenicity. No ductal dilatation. No ascites. IMPRESSION: Unremarkable right upper quadrant abdominal ultrasound. Electronically Signed   By: Tollie Eth M.D.   On: 04/07/2016 01:55      Impression / Plan:   COSETTE PRINDLE is a 45 y.o. y/o female with abdominal pain in the epigastric area. The patient will be set up for an EGD due to her abdominal pain and use of anti-inflammatory medication. If the patient's exam is negative then it is likely gastritis as the cause of her symptoms. The patient has been explained the plan and agrees with it.  Thank you for involving me in the care of this patient.        LOS: 0 days   Leslie Minium, [name]  04/07/2016, 4:50 PM   Note: This dictation was prepared with Dragon dictation along with smaller phrase technology. Any transcriptional errors that result from this process are unintentional.

## 2016-04-07 NOTE — Anesthesia Postprocedure Evaluation (Signed)
Anesthesia Post Note  Patient: [name]  Procedure(s) Performed: Procedure(s) (LRB): ESOPHAGOGASTRODUODENOSCOPY (EGD) WITH PROPOFOL (N/A)  Patient location during evaluation: Endoscopy Anesthesia Type: General Level of consciousness: awake and alert Pain management: pain level controlled Vital Signs Assessment: post-procedure vital signs reviewed and stable Respiratory status: spontaneous breathing and respiratory function stable Cardiovascular status: stable Anesthetic complications: no    Last Vitals:  Vitals:   04/07/16 0547 04/07/16 1058  BP: 109/60 125/69  Pulse: (!) 108 (!) 106  Resp: 18 16  Temp: 36.8 C 37.7 C    Last Pain:  Vitals:   04/07/16 1058  TempSrc: Tympanic  PainSc:                  Robertt Buda K

## 2016-04-07 NOTE — H&P (Signed)
[name] is an 45 y.o. female.   Chief Complaint: Abdominal pain HPI: The patient with no significant past medical history presents to the emergency department complaining of abdominal pain that began 2 days ago. She states that at the onset of pain she finished eating a very large meal. She admits to some nausea but denies vomiting or diarrhea. Her appetite is decreased since the onset of pain but she has been able to eat without vomiting. In the emergency department CT imaging did not reveal any acute process but the patient continued to be very tachycardic. Due to her intractable pain and persistent tachycardia in the emergency department staff called the hospitalist service for admission.  Past Medical History:  Diagnosis Date  . History of palpitations   . Migraine   . Mitral valve prolapse     Past Surgical History:  Procedure Laterality Date  . APPENDECTOMY    . CESAREAN SECTION    . TONSILLECTOMY      Family History  Problem Relation Age of Onset  . Heart failure Father    Social History:  reports that she has never smoked. She does not have any smokeless tobacco history on file. She reports that she does not drink alcohol or use drugs.  Allergies: No Known Allergies  Prior to Admission medications   Medication Sig Start Date End Date Taking? Authorizing Provider  ibuprofen (ADVIL,MOTRIN) 800 MG tablet Take 800 mg by mouth every 8 (eight) hours as needed.    Historical Provider, MD  Multiple Vitamins tablet Take 1 tablet by mouth daily.     Historical Provider, MD  ondansetron (ZOFRAN-ODT) 8 MG disintegrating tablet Take 8 mg by mouth every 8 (eight) hours as needed.  08/04/14   Historical Provider, MD  ranitidine (ZANTAC) 300 MG capsule Take 300 mg by mouth every evening.  08/04/14 08/04/15  Historical Provider, MD     Results for orders placed or performed during the hospital encounter of 04/06/16 (from the past 48 hour(s))  Lipase, blood     Status: None    Collection Time: 04/06/16  9:52 PM  Result Value Ref Range   Lipase 22 11 - 51 U/L  Comprehensive metabolic panel     Status: Abnormal   Collection Time: 04/06/16  9:52 PM  Result Value Ref Range   Sodium 137 135 - 145 mmol/L   Potassium 3.3 (L) 3.5 - 5.1 mmol/L   Chloride 106 101 - 111 mmol/L   CO2 24 22 - 32 mmol/L   Glucose, Bld 116 (H) 65 - 99 mg/dL   BUN 6 6 - 20 mg/dL   Creatinine, Ser 0.57 0.44 - 1.00 mg/dL   Calcium 8.8 (L) 8.9 - 10.3 mg/dL   Total Protein 7.5 6.5 - 8.1 g/dL   Albumin 4.0 3.5 - 5.0 g/dL   AST 24 15 - 41 U/L   ALT 14 14 - 54 U/L   Alkaline Phosphatase 49 38 - 126 U/L   Total Bilirubin 0.4 0.3 - 1.2 mg/dL   GFR calc non Af Amer >60 >60 mL/min   GFR calc Af Amer >60 >60 mL/min    Comment: (NOTE) The eGFR has been calculated using the CKD EPI equation. This calculation has not been validated in all clinical situations. eGFR's persistently <60 mL/min signify possible Chronic Kidney Disease.    Anion gap 7 5 - 15  CBC     Status: None   Collection Time: 04/06/16  9:52 PM  Result Value Ref  Range   WBC 8.9 3.6 - 11.0 K/uL   RBC 4.67 3.80 - 5.20 MIL/uL   Hemoglobin 14.2 12.0 - 16.0 g/dL   HCT 40.3 35.0 - 47.0 %   MCV 86.2 80.0 - 100.0 fL   MCH 30.4 26.0 - 34.0 pg   MCHC 35.3 32.0 - 36.0 g/dL   RDW 12.7 11.5 - 14.5 %   Platelets 274 150 - 440 K/uL  Urinalysis complete, with microscopic     Status: Abnormal   Collection Time: 04/06/16  9:52 PM  Result Value Ref Range   Color, Urine STRAW (A) YELLOW   APPearance CLEAR (A) CLEAR   Glucose, UA NEGATIVE NEGATIVE mg/dL   Bilirubin Urine NEGATIVE NEGATIVE   Ketones, ur NEGATIVE NEGATIVE mg/dL   Specific Gravity, Urine 1.003 (L) 1.005 - 1.030   Hgb urine dipstick 3+ (A) NEGATIVE   pH 6.0 5.0 - 8.0   Protein, ur NEGATIVE NEGATIVE mg/dL   Nitrite NEGATIVE NEGATIVE   Leukocytes, UA NEGATIVE NEGATIVE   RBC / HPF 6-30 0 - 5 RBC/hpf   WBC, UA 0-5 0 - 5 WBC/hpf   Bacteria, UA RARE (A) NONE SEEN   Squamous  Epithelial / LPF 0-5 (A) NONE SEEN   Mucous PRESENT   Pregnancy, urine POC     Status: None   Collection Time: 04/06/16 11:30 PM  Result Value Ref Range   Preg Test, Ur NEGATIVE NEGATIVE    Comment:        THE SENSITIVITY OF THIS METHODOLOGY IS >24 mIU/mL    Ct Abdomen Pelvis W Contrast  Result Date: 04/07/2016 CLINICAL DATA:  Acute upper mid abdominal pain worsening with inspiration. Appendectomy and cesarean section history. EXAM: CT ABDOMEN AND PELVIS WITH CONTRAST TECHNIQUE: Multidetector CT imaging of the abdomen and pelvis was performed using the standard protocol following bolus administration of intravenous contrast. CONTRAST:  133m ISOVUE-300 IOPAMIDOL (ISOVUE-300) INJECTION 61% COMPARISON:  Abdominal ultrasound from earlier on the same day FINDINGS: LOWER CHEST: Lung bases are clear. Minimal bibasilar dependent atelectasis. Included heart size is normal. No pericardial effusion. HEPATOBILIARY: Liver enhances homogeneously. Minimal left hepatic intraductal dilatation. Distended appearing gallbladder without wall thickening. These findings could be due to a fasting state. PANCREAS: Normal. SPLEEN: Normal. ADRENALS/URINARY TRACT: Kidneys are orthotopic, demonstrating symmetric enhancement. No nephrolithiasis, hydronephrosis or solid renal masses. The unopacified ureters are normal in course and caliber. Delayed imaging through the kidneys demonstrates symmetric prompt contrast excretion within the proximal urinary collecting system. Urinary bladder is partially distended and unremarkable. Normal adrenal glands. STOMACH/BOWEL: The stomach, small and large bowel are normal in course. Short segmental areas of jejunal thickening could be due to contraction and/or under distention. Enteritis is not entirely excluded. Moderate colonic stool burden is seen. Appendectomy by report. VASCULAR/LYMPHATIC: Aortoiliac vessels are normal in course and caliber. No lymphadenopathy by CT size criteria.  REPRODUCTIVE: Heterogeneous enhancement of retroverted uterus. Nabothian cysts are suggested at the cervix. OTHER: No intraperitoneal free fluid or free air. MUSCULOSKELETAL: Nonacute. Slight disc space narrowing at L5-S1 with mild left neural foraminal encroachment from osteophyte. IMPRESSION: Short segmental areas of jejunal thickening could be due to contraction and/or underdistention. Mild enteritis is not entirely excluded. Moderate colonic stool burden without bowel obstruction. Electronically Signed   By: DAshley RoyaltyM.D.   On: 04/07/2016 04:03   [place] Limited Ruq  Result Date: 04/07/2016 CLINICAL DATA:  Epigastric pain and nausea. Upper abdominal pain for 2 days. EXAM: [place] LIMITED - RIGHT UPPER QUADRANT COMPARISON:  None. FINDINGS: Gallbladder: No gallstones or wall thickening visualized. No sonographic Murphy sign noted by sonographer. Common bile duct: Diameter: 4 mm Liver: No focal lesion identified. Within normal limits in parenchymal echogenicity. No ductal dilatation. No ascites. IMPRESSION: Unremarkable right upper quadrant abdominal ultrasound. Electronically Signed   By: Ashley Royalty M.D.   On: 04/07/2016 01:55    Review of Systems  Constitutional: Negative for chills and fever.  HENT: Negative for sore throat and tinnitus.   Eyes: Negative.  Negative for blurred vision and redness.  Respiratory: Negative for cough and shortness of breath.   Cardiovascular: Negative for chest pain, palpitations, orthopnea and PND.  Gastrointestinal: Positive for abdominal pain. Negative for diarrhea, nausea and vomiting.  Genitourinary: Negative for dysuria, frequency and urgency.  Musculoskeletal: Negative for joint pain and myalgias.  Skin: Negative for rash.       No lesions  Neurological: Negative for speech change, focal weakness and weakness.  Endo/Heme/Allergies: Does not bruise/bleed easily.       No temperature intolerance  Psychiatric/Behavioral: Negative for depression and  suicidal ideas.    Blood pressure 126/80, pulse (!) 119, temperature 99.1 F (37.3 C), temperature source Oral, resp. rate (!) 31, height _0  (1.575 m), weight 57.2 kg (126 lb), last menstrual period 03/16/2016, SpO2 100 %. Physical Exam  Vitals reviewed. Constitutional: She is oriented to person, place, and time. She appears well-developed and well-nourished. No distress.  HENT:  Head: Normocephalic and atraumatic.  Mouth/Throat: Oropharynx is clear and moist.  Eyes: Conjunctivae and EOM are normal. Pupils are equal, round, and reactive to light. No scleral icterus.  Neck: Normal range of motion. Neck supple. No JVD present. No tracheal deviation present. No thyromegaly present.  Cardiovascular: Regular rhythm and normal heart sounds.  Tachycardia present.  Exam reveals no gallop and no friction rub.   No murmur heard. Respiratory: Effort normal and breath sounds normal.  GI: Soft. Bowel sounds are normal. She exhibits no distension. There is no tenderness.  Genitourinary:  Genitourinary Comments: Deferred  Lymphadenopathy:    She has no cervical adenopathy.  Neurological: She is alert and oriented to person, place, and time. No cranial nerve deficit. She exhibits normal muscle tone.  Skin: Skin is warm and dry. No rash noted. No erythema.  Psychiatric: She has a normal mood and affect. Her behavior is normal. Judgment and thought content normal.     Assessment/Plan This is a 45 year old female admitted for persistent tachycardia. 1. Tachycardia: Secondary to pain and/or dehydration secondary to decreased by mouth intake. Continue to hydrate with intravenous fluid. Manage pain as best as possible. 2. Abdominal pain: The patient admits to a time when she was taking H2 blockers due to suspected reflux. Her presenting symptoms at that time or chest pain for which she was seen by the cardiologist was subsequently discontinued her acid reflux medication. I have opted to restart Pepcid at  this time. The emergency department ordered Cipro and Flagyl empirically. The patient will receive 1 dose by have not continued antibiotics at this time. Gastroenterology consult has been placed. 3. DVT prophylaxis: Lovenox 4. GI prophylaxis: H2 blocker The patient is a full code. Time spent on admission orders and patient care approximately 45 minutes  Harrie Foreman, MD 04/07/2016, 4:28 AM

## 2016-04-08 ENCOUNTER — Encounter: Payer: Self-pay | Admitting: Gastroenterology

## 2016-04-08 LAB — BASIC METABOLIC PANEL
ANION GAP: 2 — AB (ref 5–15)
BUN: 5 mg/dL — ABNORMAL LOW (ref 6–20)
CHLORIDE: 114 mmol/L — AB (ref 101–111)
CO2: 25 mmol/L (ref 22–32)
Calcium: 8.3 mg/dL — ABNORMAL LOW (ref 8.9–10.3)
Creatinine, Ser: 0.57 mg/dL (ref 0.44–1.00)
GFR calc non Af Amer: >60 mL/min (ref >60)
GLUCOSE: 97 mg/dL (ref 65–99)
Potassium: 4.1 mmol/L (ref 3.5–5.1)
Sodium: 141 mmol/L (ref 135–145)

## 2016-04-08 LAB — HEMOGLOBIN A1C
Hgb A1c MFr Bld: 5.3 % (ref 4.8–5.6)
Mean Plasma Glucose: 105 mg/dL

## 2016-04-08 NOTE — Discharge Summary (Signed)
[name], is a 45 y.o. female  DOB [birth year]  MRN [id number].  Admission date:  04/06/2016  Admitting Physician  Arnaldo Natal, MD  Discharge Date:  04/08/2016   Primary MD  Rolm Gala, MD  Recommendations for primary care physician for things to follow:  Follow up with primary doctor in 1 week  Admission Diagnosis  Nausea [R11.0] Tachycardia [R00.0] Intractable pain [R52] Pain of upper abdomen [R10.10]   Discharge Diagnosis  Nausea [R11.0] Tachycardia [R00.0] Intractable pain [R52] Pain of upper abdomen [R10.10]    Active Problems:   Tachycardia   Abdominal pain, epigastric   Nausea      Past Medical History:  Diagnosis Date  . History of palpitations   . Migraine   . Mitral valve prolapse     Past Surgical History:  Procedure Laterality Date  . APPENDECTOMY    . CESAREAN SECTION    . ESOPHAGOGASTRODUODENOSCOPY (EGD) WITH PROPOFOL N/A 04/07/2016   Procedure: ESOPHAGOGASTRODUODENOSCOPY (EGD) WITH PROPOFOL;  Surgeon: Midge Minium, MD;  Location: [facility] ENDOSCOPY;  Service: Endoscopy;  Laterality: N/A;  . TONSILLECTOMY         History of present illness and  Hospital Course:     Kindly see H&P for history of present illness and admission details, please review complete Labs, Consult reports and Test reports for all details in brief  HPI  from the history and physical done on the day of admission 45 year old female patient admitted a on November 60 because of epigastric pain, tachycardia, some nausea. No diarrhea. Decreased appetite. Abdominal CAT scan in the emergency room did not show any acute process. Patient admitted for tachycardia, abdominal pain.   Hospital Course  1 abdominal pain and epigastric area likely due to acute gastritis. Patient seen by gastroenterologist Dr. Darlina Rumpf, EGD did not show any acute abnormality. Patient received IV fluids, IV PPIs, IV pain medicine. Patient thought to have acute viral gastritis. Symptoms improved. Patient tolerated the regular diet. She is stable to go home today. No need to continue  PPIs at discharge. 2. history of migraine headaches: Patient takes Maxalt at home. Required some Fioricet in the hospital because of migraine headache.. #3 tachycardia resolved with hydration.    Discharge Condition: stable   Follow UP      Discharge Instructions  and  Discharge Medications        Medication List    TAKE these medications   acetaminophen 325 MG tablet Commonly known as:  TYLENOL Take 650 mg by mouth every 6 (six) hours as needed for mild pain or headache.   ibuprofen 200 MG tablet Commonly known as:  ADVIL,MOTRIN Take 400 mg by mouth every 6 (six) hours as needed for headache or mild pain.   Multiple Vitamins tablet Take 1 tablet by mouth daily.   ondansetron 8 MG disintegrating tablet Commonly known as:  ZOFRAN-ODT Take 8 mg by mouth every 8 (eight) hours as needed for nausea or vomiting.   rizatriptan 10 MG tablet Commonly known as:  MAXALT Take 10 mg by mouth as needed for migraine. May repeat in 2 hours if needed         Diet and Activity recommendation: See Discharge Instructions above   Consults obtained -GI   Major procedures and Radiology Reports - PLEASE review detailed and final reports for all details, in brief -     Ct Abdomen Pelvis W Contrast  Result Date: 04/07/2016 CLINICAL DATA:  Acute upper mid abdominal pain  worsening with inspiration. Appendectomy and cesarean section history. EXAM: CT ABDOMEN AND PELVIS WITH CONTRAST TECHNIQUE: Multidetector CT imaging of the abdomen and pelvis was performed using the standard protocol following bolus administration of intravenous contrast. CONTRAST:  100mL ISOVUE-300 IOPAMIDOL (ISOVUE-300) INJECTION 61% COMPARISON:  Abdominal  ultrasound from earlier on the same day FINDINGS: LOWER CHEST: Lung bases are clear. Minimal bibasilar dependent atelectasis. Included heart size is normal. No pericardial effusion. HEPATOBILIARY: Liver enhances homogeneously. Minimal left hepatic intraductal dilatation. Distended appearing gallbladder without wall thickening. These findings could be due to a fasting state. PANCREAS: Normal. SPLEEN: Normal. ADRENALS/URINARY TRACT: Kidneys are orthotopic, demonstrating symmetric enhancement. No nephrolithiasis, hydronephrosis or solid renal masses. The unopacified ureters are normal in course and caliber. Delayed imaging through the kidneys demonstrates symmetric prompt contrast excretion within the proximal urinary collecting system. Urinary bladder is partially distended and unremarkable. Normal adrenal glands. STOMACH/BOWEL: The stomach, small and large bowel are normal in course. Short segmental areas of jejunal thickening could be due to contraction and/or under distention. Enteritis is not entirely excluded. Moderate colonic stool burden is seen. Appendectomy by report. VASCULAR/LYMPHATIC: Aortoiliac vessels are normal in course and caliber. No lymphadenopathy by CT size criteria. REPRODUCTIVE: Heterogeneous enhancement of retroverted uterus. Nabothian cysts are suggested at the cervix. OTHER: No intraperitoneal free fluid or free air. MUSCULOSKELETAL: Nonacute. Slight disc space narrowing at L5-S1 with mild left neural foraminal encroachment from osteophyte. IMPRESSION: Short segmental areas of jejunal thickening could be due to contraction and/or underdistention. Mild enteritis is not entirely excluded. Moderate colonic stool burden without bowel obstruction. Electronically Signed   By: Tollie Ethavid  Kwon M.D.   On: 04/07/2016 04:03   [place] Abdomen Limited Ruq  Result Date: 04/07/2016 CLINICAL DATA:  Epigastric pain and nausea. Upper abdominal pain for 2 days. EXAM: US ABDOMEN LIMITED - RIGHT UPPER QUADRANT  COMPARISON:  None. FINDINGS: Gallbladder: No gallstones or wall thickening visualized. No sonographic Murphy sign noted by sonographer. Common bile duct: Diameter: 4 mm Liver: No focal lesion identified. Within normal limits in parenchymal echogenicity. No ductal dilatation. No ascites. IMPRESSION: Unremarkable right upper quadrant abdominal ultrasound. Electronically Signed   By: Tollie Ethavid  Kwon M.D.   On: 04/07/2016 01:55    Micro Results     No results found for this or any previous visit (from the past 240 hour(s)).     Today   Subjective:   Claudia Pollocklizabeth Rufino today has no headache,no chest abdominal pain,no new weakness tingling or numbness, feels much better wants to go home today.   Objective:   Blood pressure 137/74, pulse 83, temperature 98 F (36.7 C), temperature source Oral, resp. rate 16, height 5\' 2"  (1.575 m), weight 59 kg (130 lb), last menstrual period 03/16/2016, SpO2 100 %.   Intake/Output Summary (Last 24 hours) at 04/08/16 1048 Last data filed at 04/08/16 0926  Gross per 24 hour  Intake          3174.17 ml  Output              200 ml  Net          2974.17 ml    Exam Awake Alert, Oriented x 3, No new F.N deficits, Normal affect Cecilia.AT,PERRAL Supple Neck,No JVD, No cervical lymphadenopathy appriciated.  Symmetrical Chest wall movement, Good air movement bilaterally, CTAB RRR,No Gallops,Rubs or new Murmurs, No Parasternal Heave +ve B.Sounds, Abd Soft, Non tender, No organomegaly appriciated, No rebound -guarding or rigidity. No Cyanosis, Clubbing or edema, No new Rash or bruise  Data Review   CBC w Diff: Lab Results  Component Value Date   WBC 8.9 04/06/2016   HGB 14.2 04/06/2016   HCT 40.3 04/06/2016   PLT 274 04/06/2016    CMP: Lab Results  Component Value Date   NA 141 04/08/2016   K 4.1 04/08/2016   CL 114 (H) 04/08/2016   CO2 25 04/08/2016   BUN 5 (L) 04/08/2016   CREATININE 0.57 04/08/2016   PROT 7.5 04/06/2016   ALBUMIN 4.0 04/06/2016    BILITOT 0.4 04/06/2016   ALKPHOS 49 04/06/2016   AST 24 04/06/2016   ALT 14 04/06/2016  .   Total Time in preparing paper work, data evaluation and todays exam - 35 minutes  Romi Rathel M.D on 04/08/2016 at 10:48 AM    Note: This dictation was prepared with Dragon dictation along with smaller phrase technology. Any transcriptional errors that result from this process are unintentional.

## 2016-04-08 NOTE — Progress Notes (Signed)
Pt given d/c instructions r/t home meds , voiced understanding, pt d/c home with family

## 2016-04-08 NOTE — Progress Notes (Signed)
While rounding, CH made initial visit to room 115. Pt was fully dressed and awaiting discharge. Husband was beside. Pt was in good spirits and did not require spiritual care at this time.    04/08/16 1200  Clinical Encounter Type  Visited With Patient;Patient and family together  Visit Type Initial;Spiritual support  Referral From Nurse

## 2016-06-10 ENCOUNTER — Ambulatory Visit: Payer: Self-pay | Attending: Family Medicine

## 2016-06-10 DIAGNOSIS — M545 Low back pain, unspecified: Secondary | ICD-10-CM

## 2016-06-10 DIAGNOSIS — R278 Other lack of coordination: Secondary | ICD-10-CM | POA: Insufficient documentation

## 2016-06-10 NOTE — Therapy (Signed)
[facility] [address] [place], [place], [address] Phone: [phone]   Fax:  [phone]  Patient Details  Name: [name] MRN: [id number] Date of Birth: 19-Jul-1970 Referring Provider:  Rolm GalaGrandis, Heidi, MD  Encounter Date: 06/10/2016  PT/OT/SLP Screening Form   Time: in 805  Time out 830   Complaint LBP Past Medical Hx: Migranes, previous LBP Injury Date:06/08/16 Pain Scale: 2/10 at rest; 9/10 with movement Patient's phone number:   Hx (this occurrence):  Patient experienced increased low back pack from moving 5 gallon water bottles the previous Sunday. Patient states she has pain with all lumbar movements but most notably with bending, squatting, twisting, and prolonged walking. Patient reports difficulty with rolling in bed. States the pain does not radiate and is slightly more affected on the R side versus the L.     Assessment: Patient demonstrates increased pain after all lumbar motion assessments; with pain limiting all movement. Spring testing reproduces pain indicating lumbar joint dysfunction (more of the R transverse processes and centrally versus the L transverse). Patient also demonstrates increased guarding along lumbar paraspinals and multifidi. Recommend further PT assessment. Patient demonstrated no change with extension based lumbar movement.     Recommendations:   Further pt eval if pain does not decrease by the end of the week.  Comments:    []  Patient would benefit from an MD referral [x]  Patient would benefit from a full PT/OT/ SLP evaluation and treatment. []  No intervention recommended at this time.    Myrene GalasWesley Thatiana Renbarger, PT DPT 06/10/2016, 8:09 AM   [facility] [address] [place], [place], [address] Phone: [phone]   Fax:  [phone]

## 2016-07-18 ENCOUNTER — Encounter: Payer: Self-pay | Admitting: Physician Assistant

## 2016-07-18 ENCOUNTER — Ambulatory Visit: Payer: Self-pay | Admitting: Physician Assistant

## 2016-07-18 VITALS — BP 100/70 | HR 97 | Temp 98.4°F

## 2016-07-18 DIAGNOSIS — B349 Viral infection, unspecified: Secondary | ICD-10-CM

## 2016-07-18 NOTE — Progress Notes (Signed)
S: C/o dry cough and  congestion with dry cough for 2 days, + fever, chills, temp around 99-100;  denies cp/sob, v/d; mucus was green this am but clear throughout the day, cough is sporadic,   Using otc meds: robitussin  O: PE: vitals wnl, nad,  perrl eomi, normocephalic, tms dull, nasal mucosa red and swollen, throat injected, neck supple no lymph, lungs c t a, cv rrr, neuro intact, flu swab neg  A:  Acute flu like illness   P: drink fluids, continue regular meds , use otc meds of choice, return if not improving in 5 days, return earlier if worsening , tamiflu 75mg  bid

## 2016-07-21 ENCOUNTER — Telehealth: Payer: Self-pay | Admitting: Physician Assistant

## 2016-07-21 MED ORDER — AZITHROMYCIN 250 MG PO TABS: ORAL_TABLET | ORAL | 0 refills | Status: DC

## 2016-07-21 NOTE — Telephone Encounter (Signed)
rx for zpack sent to pharmacy, pt called office to state she was not better and ?if could get antibiotic

## 2016-07-21 NOTE — Telephone Encounter (Signed)
Spoke with patient stated that Zpack will be fine. [facility] [place]

## 2016-07-21 NOTE — Telephone Encounter (Signed)
I;ll send in zpack if she does ok with this medication, just call and ask her if that usually works when she takes it

## 2016-07-21 NOTE — Addendum Note (Signed)
Addended by: Faythe GheeFISHER, Leam Madero W on: 07/21/2016 04:07 PM   Modules accepted: Orders

## 2016-07-21 NOTE — Telephone Encounter (Signed)
Spoke with patient stated that the Zpack will be fine [facility] [place]

## 2016-10-09 ENCOUNTER — Other Ambulatory Visit: Payer: Self-pay | Admitting: Family Medicine

## 2016-10-09 DIAGNOSIS — Z1231 Encounter for screening mammogram for malignant neoplasm of breast: Secondary | ICD-10-CM

## 2016-10-28 ENCOUNTER — Ambulatory Visit
Admission: RE | Admit: 2016-10-28 | Discharge: 2016-10-28 | Disposition: A | Discharge status: Home / Self Care | Payer: BC Managed Care – PPO | Source: Ambulatory Visit | Attending: Family Medicine | Admitting: Family Medicine

## 2016-10-28 ENCOUNTER — Encounter (HOSPITAL_COMMUNITY): Payer: Self-pay

## 2016-10-28 DIAGNOSIS — Z1231 Encounter for screening mammogram for malignant neoplasm of breast: Secondary | ICD-10-CM | POA: Diagnosis not present

## 2016-11-27 ENCOUNTER — Ambulatory Visit: Payer: Self-pay | Admitting: Physician Assistant

## 2016-11-27 ENCOUNTER — Encounter: Payer: Self-pay | Admitting: Physician Assistant

## 2016-11-27 VITALS — BP 110/80 | HR 83 | Temp 98.3°F

## 2016-11-27 DIAGNOSIS — J069 Acute upper respiratory infection, unspecified: Secondary | ICD-10-CM

## 2016-11-27 MED ORDER — FLUCONAZOLE 150 MG PO TABS: ORAL_TABLET | ORAL | 0 refills | Status: DC

## 2016-11-27 MED ORDER — DOXYCYCLINE HYCLATE 100 MG PO TABS: 100.0000 mg | ORAL_TABLET | Freq: Two times a day (BID) | ORAL | 0 refills | Status: DC

## 2016-11-27 NOTE — Progress Notes (Signed)
S: C/osore throat last week, then congestion for 3 days this week, 1 day of  fever, chills, denies cp/sob, v/d; mucus was green, also [place], no rash  Using otc meds:   O: PE: vitals wnl, nad, perrl eomi, normocephalic, tms dull, nasal mucosa red and swollen, throat injected, neck supple no lymph, lungs c t a, cv rrr, neuro intact  A:  Acute uri   P: drink fluids, continue regular meds , use otc meds of choice, return if not improving in 5 days, return earlier if worsening , doxy 100mg  bid x 12d, diflucan if needed

## 2017-03-11 ENCOUNTER — Ambulatory Visit: Payer: Self-pay | Admitting: Physician Assistant

## 2017-03-11 ENCOUNTER — Encounter: Payer: Self-pay | Admitting: Physician Assistant

## 2017-03-11 VITALS — BP 110/80 | HR 100 | Temp 98.3°F

## 2017-03-11 DIAGNOSIS — R319 Hematuria, unspecified: Secondary | ICD-10-CM

## 2017-03-11 DIAGNOSIS — R300 Dysuria: Secondary | ICD-10-CM

## 2017-03-11 DIAGNOSIS — N390 Urinary tract infection, site not specified: Secondary | ICD-10-CM

## 2017-03-11 LAB — POCT URINALYSIS DIPSTICK
BILIRUBIN UA: NEGATIVE
GLUCOSE UA: NEGATIVE
Ketones, UA: NEGATIVE
NITRITE UA: NEGATIVE
Spec Grav, UA: 1.025 (ref 1.010–1.025)
UROBILINOGEN UA: 0.2 U/dL
pH, UA: 6 (ref 5.0–8.0)

## 2017-03-11 MED ORDER — CIPROFLOXACIN HCL 250 MG PO TABS: 250.0000 mg | ORAL_TABLET | Freq: Two times a day (BID) | ORAL | 0 refills | Status: DC

## 2017-03-11 NOTE — Progress Notes (Signed)
S:  C/o uti sx for 2 days, burning, urgency, frequency, denies fever, chills, vaginal discharge, abdominal pain or flank pain:  Remainder ros neg  O:  Vitals wnl, nad, no cva tenderness, back nontender, lungs c t a,cv rrr, n/v intact  A: uti  P: cipro  bid x 7d, increase water intake, add cranberry juice, return if not improving in 2 -3 days, return earlier if worsening, discussed pyelonephritis sx

## 2017-03-30 ENCOUNTER — Ambulatory Visit: Payer: Self-pay | Admitting: Physician Assistant

## 2017-03-30 ENCOUNTER — Encounter: Payer: Self-pay | Admitting: Physician Assistant

## 2017-03-30 VITALS — BP 110/80 | HR 97 | Temp 98.4°F

## 2017-03-30 DIAGNOSIS — J029 Acute pharyngitis, unspecified: Secondary | ICD-10-CM

## 2017-03-30 LAB — POCT RAPID STREP A (OFFICE): Rapid Strep A Screen: NEGATIVE

## 2017-03-30 NOTE — Progress Notes (Signed)
S: c/o sore throat for 2-3 days, hurts to swallow, no fever/chills, some clear mucus when she blows her nose, mild cough, no cp/sob  O: vitals wnl, nad, tms clear, throat wnl, neck supple no lymph,l ungs c t a , cv rrr q strep negj  A: acute pharyngitis, pnd  P: otc flonase and sudafed

## 2018-07-20 ENCOUNTER — Other Ambulatory Visit: Payer: Self-pay | Admitting: Physician Assistant

## 2018-07-20 DIAGNOSIS — Z1231 Encounter for screening mammogram for malignant neoplasm of breast: Secondary | ICD-10-CM

## 2018-07-27 ENCOUNTER — Ambulatory Visit: Payer: Self-pay

## 2018-07-27 ENCOUNTER — Ambulatory Visit
Admission: RE | Admit: 2018-07-27 | Discharge: 2018-07-27 | Disposition: A | Discharge status: Home / Self Care | Payer: BC Managed Care – PPO | Source: Ambulatory Visit | Attending: Physician Assistant | Admitting: Physician Assistant

## 2018-07-27 DIAGNOSIS — Z1231 Encounter for screening mammogram for malignant neoplasm of breast: Secondary | ICD-10-CM | POA: Diagnosis not present

## 2018-10-27 ENCOUNTER — Telehealth: Payer: Self-pay | Admitting: Cardiovascular Disease

## 2018-10-27 NOTE — Telephone Encounter (Signed)
Virtual Visit Pre-Appointment Phone Call  "(Name), I am calling you today to discuss your upcoming appointment. We are currently trying to limit exposure to the virus that causes COVID-19 by seeing patients at home rather than in the office."  1. "What is the BEST phone number to call the day of the visit?" - include this in appointment notes  2. Do you have or have access to (through a family member/friend) a smartphone with video capability that we can use for your visit?" a. If yes - list this number in appt notes as cell (if different from BEST phone #) and list the appointment type as a VIDEO visit in appointment notes b. If no - list the appointment type as a PHONE visit in appointment notes  3. Confirm consent - "In the setting of the current Covid19 crisis, you are scheduled for a (phone or video) visit with your provider on (date) at (time).  Just as we do with many in-office visits, in order for you to participate in this visit, we must obtain consent.  If you'd like, I can send this to your mychart (if signed up) or email for you to review.  Otherwise, I can obtain your verbal consent now.  All virtual visits are billed to your insurance company just like a normal visit would be.  By agreeing to a virtual visit, we'd like you to understand that the technology does not allow for your provider to perform an examination, and thus may limit your provider's ability to fully assess your condition. If your provider identifies any concerns that need to be evaluated in person, we will make arrangements to do so.  Finally, though the technology is pretty good, we cannot assure that it will always work on either your or our end, and in the setting of a video visit, we may have to convert it to a phone-only visit.  In either situation, we cannot ensure that we have a secure connection.  Are you willing to proceed?" STAFF: Did the patient verbally acknowledge consent to telehealth visit? Document  YES/NO here: YES  4. Advise patient to be prepared - "Two hours prior to your appointment, go ahead and check your blood pressure, pulse, oxygen saturation, and your weight (if you have the equipment to check those) and write them all down. When your visit starts, your provider will ask you for this information. If you have an Apple Watch or Kardia device, please plan to have heart rate information ready on the day of your appointment. Please have a pen and paper handy nearby the day of the visit as well."  5. Give patient instructions for MyChart download to smartphone OR Doximity/Doxy.me as below if video visit (depending on what platform provider is using)  6. Inform patient they will receive a phone call 15 minutes prior to their appointment time (may be from unknown caller ID) so they should be prepared to answer    TELEPHONE CALL NOTE  MIRAYA HARTINGER has been deemed a candidate for a follow-up tele-health visit to limit community exposure during the Covid-19 pandemic. I spoke with the patient via phone to ensure availability of phone/video source, confirm preferred email & phone number, and discuss instructions and expectations.  I reminded [name] to be prepared with any vital sign and/or heart rhythm information that could potentially be obtained via home monitoring, at the time of her visit. I reminded [facility] to expect a phone call prior to  her visit.  Norman Herrlichshley Gerringer 10/27/2018 9:59 AM   INSTRUCTIONS FOR DOWNLOADING THE MYCHART APP TO SMARTPHONE  - The patient must first make sure to have activated MyChart and know their login information - If Apple, go to [employer] Store and type in MyChart in the search bar and download the app. If Android, ask patient to go to [employer] Play Store and type in KamailiMyChart in the search bar and download the app. The app is free but as with any other app downloads, their phone may require them to verify saved payment  information or Apple/Android password.  - The patient will need to then log into the app with their MyChart username and password, and select [facility] as their healthcare provider to link the account. When it is time for your visit, go to the MyChart app, find appointments, and click Begin Video Visit. Be sure to Select Allow for your device to access the Microphone and Camera for your visit. You will then be connected, and your provider will be with you shortly.  **If they have any issues connecting, or need assistance please contact MyChart service desk [phone])**  **If using a computer, in order to ensure the best quality for their visit they will need to use either of the following Internet Browsers: [employer] Edge, or Google Chrome**  IF USING DOXIMITY or DOXY.ME - The patient will receive a link just prior to their visit by text.     FULL LENGTH CONSENT FOR TELE-HEALTH VISIT   I hereby voluntarily request, consent and authorize [facility] and its employed or contracted physicians, physician assistants, nurse practitioners or other licensed health care professionals (the Practitioner), to provide me with telemedicine health care services (the Services") as deemed necessary by the treating Practitioner. I acknowledge and consent to receive the Services by the Practitioner via telemedicine. I understand that the telemedicine visit will involve communicating with the Practitioner through live audiovisual communication technology and the disclosure of certain medical information by electronic transmission. I acknowledge that I have been given the opportunity to request an in-person assessment or other available alternative prior to the telemedicine visit and am voluntarily participating in the telemedicine visit.  I understand that I have the right to withhold or withdraw my consent to the use of telemedicine in the course of my care at any time, without affecting my right  to future care or treatment, and that the Practitioner or I may terminate the telemedicine visit at any time. I understand that I have the right to inspect all information obtained and/or recorded in the course of the telemedicine visit and may receive copies of available information for a reasonable fee.  I understand that some of the potential risks of receiving the Services via telemedicine include:   Delay or interruption in medical evaluation due to technological equipment failure or disruption;  Information transmitted may not be sufficient (e.g. poor resolution of images) to allow for appropriate medical decision making by the Practitioner; and/or   In rare instances, security protocols could fail, causing a breach of personal health information.  Furthermore, I acknowledge that it is my responsibility to provide information about my medical history, conditions and care that is complete and accurate to the best of my ability. I acknowledge that Practitioner's advice, recommendations, and/or decision may be based on factors not within their control, such as incomplete or inaccurate data provided by me or distortions of diagnostic images or specimens that may result from electronic transmissions. I understand  that the practice of medicine is not an exact science and that Practitioner makes no warranties or guarantees regarding treatment outcomes. I acknowledge that I will receive a copy of this consent concurrently upon execution via email to the email address I last provided but may also request a printed copy by calling the office of [facility].    I understand that my insurance will be billed for this visit.   I have read or had this consent read to me.  I understand the contents of this consent, which adequately explains the benefits and risks of the Services being provided via telemedicine.   I have been provided ample opportunity to ask questions regarding this consent and the Services  and have had my questions answered to my satisfaction.  I give my informed consent for the services to be provided through the use of telemedicine in my medical care  By participating in this telemedicine visit I agree to the above.

## 2018-11-06 NOTE — Progress Notes (Signed)
Virtual Visit via Video Note   This visit type was conducted due to national recommendations for restrictions regarding the COVID-19 Pandemic (e.g. social distancing) in an effort to limit this patient's exposure and mitigate transmission in our community.  Due to her co-morbid illnesses, this patient is at least at moderate risk for complications without adequate follow up.  This format is felt to be most appropriate for this patient at this time.  All issues noted in this document were discussed and addressed.  A limited physical exam was performed with this format.  Please refer to the patient's chart for her consent to telehealth for [facility].   I connected with  [name] on 11/09/18 by a video enabled telemedicine application and verified that I am speaking with the correct person using two identifiers. I discussed the limitations of evaluation and management by telemedicine. The patient expressed understanding and agreed to proceed.   Evaluation Performed:  Follow-up visit  Date:  11/09/2018   ID:  [name], DOB [birth year], MRN [id number]  Patient Location:  [place] [address]   Provider location:   [facility], [place] office  PCP:  Hortencia Pilar, MD  Cardiologist:  Patsy Baltimore   Chief Complaint:  Life insurance evaluation    History of Present Illness:    [name] is a 48 y.o. female who presents via audio/video conferencing for a telehealth visit today.   The patient does not symptoms concerning for COVID-19 infection (fever, chills, cough, or new SHORTNESS OF BREATH).   Last seen 2016 for cardiovascular evaluation She had echocardiogram at that time showing normal LV systolic function  Long discussion today concerning remote diagnosis of mitral valve prolapse in 1990.  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 She denies any lower extremity edema, no PND, orthopnea, lightheadedness.  Nonsmoker, no diabetes. In terms of her family history, no parents or siblings with premature coronary disease.   Prior CV studies:   The following studies were reviewed today:  CT ABD 2017 No mention of aortic atherosclerosis   Past Medical History:  Diagnosis Date  . History of palpitations   . Migraine   . Mitral valve prolapse    Past Surgical History:  Procedure Laterality Date  . APPENDECTOMY    . CESAREAN SECTION    . ESOPHAGOGASTRODUODENOSCOPY (EGD) WITH PROPOFOL N/A 04/07/2016   Procedure: ESOPHAGOGASTRODUODENOSCOPY (EGD) WITH PROPOFOL;  Surgeon: Lucilla Lame, MD;  Location: [facility] ENDOSCOPY;  Service: Endoscopy;  Laterality: N/A;  . TONSILLECTOMY       No outpatient medications have been marked as taking for the 11/09/18 encounter (Appointment) with Minna Merritts, MD.     Allergies:   Patient has no known allergies.   Social History   Tobacco Use  . Smoking status: Never Smoker  . Smokeless tobacco: Never Used  Substance Use Topics  . Alcohol use: No  . Drug use: No     Current Outpatient Medications on File Prior to Visit  Medication Sig Dispense Refill  . acetaminophen (TYLENOL) 325 MG tablet Take 650 mg by mouth every 6 (six) hours as needed for mild pain or  headache.    . ciprofloxacin (CIPRO) 250 MG tablet Take 1 tablet (250 mg total) by mouth 2 (two) times daily. (Patient not taking: Reported on 03/30/2017) 14 tablet 0  . doxycycline (VIBRA-TABS) 100 MG tablet Take 1 tablet (100 mg total) by mouth 2 (two) times daily. (Patient not taking: Reported on 03/11/2017) 24 tablet 0  . fluconazole (DIFLUCAN) 150 MG tablet Use if yeast (Patient not taking: Reported on  03/11/2017) 2 tablet 0  . ibuprofen (ADVIL,MOTRIN) 200 MG tablet Take 400 mg by mouth every 6 (six) hours as needed for headache or mild pain.    . Multiple Vitamins tablet Take 1 tablet by mouth daily.     . ondansetron (ZOFRAN-ODT) 8 MG disintegrating tablet Take 8 mg by mouth every 8 (eight) hours as needed for nausea or vomiting.     . rizatriptan (MAXALT) 10 MG tablet Take 10 mg by mouth as needed for migraine. May repeat in 2 hours if needed     No current facility-administered medications on file prior to visit.      Family Hx: The patient's family history includes Breast cancer in her maternal aunt; Heart failure in her father.  ROS:   Please see the history of present illness.    Review of Systems  Constitutional: Negative.   HENT: Negative.   Respiratory: Negative.   Cardiovascular: Negative.   Gastrointestinal: Negative.   Musculoskeletal: Negative.   Neurological: Negative.   Psychiatric/Behavioral: Negative.   All other systems reviewed and are negative.     Labs/Other Tests and Data Reviewed:    Recent Labs: No results found for requested labs within last 8760 hours.   Recent Lipid Panel No results found for: CHOL, TRIG, HDL, CHOLHDL, LDLCALC, LDLDIRECT  Wt Readings from Last 3 Encounters:  04/08/16 130 lb (59 kg)  08/29/14 137 lb (62.1 kg)  08/29/14 137 lb (62.1 kg)     Exam:    Vital Signs: Vital signs may also be detailed in the HPI There were no vitals taken for this visit.  Wt Readings from Last 3 Encounters:  04/08/16 130 lb (59 kg)  08/29/14 137 lb (62.1 kg)  08/29/14 137 lb (62.1 kg)   Temp Readings from Last 3 Encounters:  03/30/17 98.4 F (36.9 C)  03/11/17 98.3 F (36.8 C)  11/27/16 98.3 F (36.8 C)   BP Readings from Last 3 Encounters:  03/30/17 110/80  03/11/17 110/80  11/27/16 110/80   Pulse Readings from Last 3 Encounters:  03/30/17 97  03/11/17 100  11/27/16 83    125/80 resp 16 84  Well nourished, well developed  female in no acute distress. Constitutional:  oriented to person, place, and time. No distress.    ASSESSMENT & PLAN:    Life insurance evaluation No known coronary disease, no risk factors for cardiovascular disease Non-smoker, no diabetes Previous echocardiogram May 2016 normal ejection fraction there was no mitral valve prolapse    COVID-19 Education: The signs and symptoms of COVID-19 were discussed with the patient and how to seek care for testing (follow up with PCP or arrange E-visit).  The importance of social distancing was discussed today.  Patient Risk:   After full review of this patients clinical status, I feel that they are at least moderate risk at this time.  Time:   Today, I have spent 25 minutes with the patient with telehealth technology discussing the cardiac and medical problems/diagnoses detailed above   10 min spent reviewing the chart prior to  patient visit today   Medication Adjustments/Labs and Tests Ordered: Current medicines are reviewed at length with the patient today.  Concerns regarding medicines are outlined above.   Tests Ordered: No tests ordered   Medication Changes: No changes made   Disposition: Follow-up as needed    Signed, Julien Nordmannimothy Cattaleya Wien, MD  11/09/2018 9:28 AM    [facility] [address], [place], [place] [address]

## 2018-11-09 ENCOUNTER — Encounter: Payer: Self-pay | Admitting: *Deleted

## 2018-11-09 ENCOUNTER — Telehealth (INDEPENDENT_AMBULATORY_CARE_PROVIDER_SITE_OTHER): Payer: Self-pay | Admitting: Cardiovascular Disease

## 2018-11-09 ENCOUNTER — Other Ambulatory Visit: Payer: Self-pay

## 2018-11-09 DIAGNOSIS — Z0000 Encounter for general adult medical examination without abnormal findings: Secondary | ICD-10-CM

## 2018-11-09 DIAGNOSIS — Z026 Encounter for examination for insurance purposes: Secondary | ICD-10-CM

## 2018-11-09 DIAGNOSIS — R0789 Other chest pain: Secondary | ICD-10-CM

## 2018-11-09 DIAGNOSIS — R011 Cardiac murmur, unspecified: Secondary | ICD-10-CM

## 2018-11-09 NOTE — Patient Instructions (Signed)
[phone] fax number, patient's personal number She needs a letter addressed to insurance company detailing she is cleared from a cardiovascular perspective, Echocardiogram May 2016 no mitral valve prolapse with normal ejection fraction No cardiac risk factors    Medication Instructions:  No changes  If you need a refill on your cardiac medications before your next appointment, please call your pharmacy.    Lab work: No new labs needed   If you have labs (blood work) drawn today and your tests are completely normal, you will receive your results only by: Marland Kitchen MyChart Message (if you have MyChart) OR . A paper copy in the mail If you have any lab test that is abnormal or we need to change your treatment, we will call you to review the results.   Testing/Procedures: No new testing needed   Follow-Up: At [facility], you and your health needs are our priority.  As part of our continuing mission to provide you with exceptional heart care, we have created designated Provider Care Teams.  These Care Teams include your primary Cardiologist (physician) and Advanced Practice Providers (APPs -  Physician Assistants and Nurse Practitioners) who all work together to provide you with the care you need, when you need it.  . You will need a follow up appointment as needed   . Providers on your designated Care Team:   . Murray Hodgkins, NP . Christell Faith, PA-C . Marrianne Mood, PA-C  Any Other Special Instructions Will Be Listed Below (If Applicable).  For educational health videos Log in to : [email or web address] Or : [email or web address], password : [email or web address]

## 2018-11-15 ENCOUNTER — Encounter: Payer: Self-pay | Admitting: Emergency Medicine

## 2018-11-15 ENCOUNTER — Emergency Department
Admission: EM | Admit: 2018-11-15 | Discharge: 2018-11-15 | Disposition: A | Discharge status: Home / Self Care | Payer: Self-pay | Attending: Student in an Organized Health Care Education/Training Program | Admitting: Student in an Organized Health Care Education/Training Program

## 2018-11-15 ENCOUNTER — Ambulatory Visit
Admission: EM | Admit: 2018-11-15 | Discharge: 2018-11-15 | Disposition: A | Discharge status: Home / Self Care | Payer: Self-pay | Attending: Emergency Medicine | Admitting: Emergency Medicine

## 2018-11-15 ENCOUNTER — Other Ambulatory Visit: Payer: Self-pay

## 2018-11-15 ENCOUNTER — Emergency Department: Payer: Self-pay

## 2018-11-15 DIAGNOSIS — Y9289 Other specified places as the place of occurrence of the external cause: Secondary | ICD-10-CM | POA: Insufficient documentation

## 2018-11-15 DIAGNOSIS — Y9389 Activity, other specified: Secondary | ICD-10-CM | POA: Insufficient documentation

## 2018-11-15 DIAGNOSIS — S60462A Insect bite (nonvenomous) of right middle finger, initial encounter: Secondary | ICD-10-CM | POA: Insufficient documentation

## 2018-11-15 DIAGNOSIS — Y998 Other external cause status: Secondary | ICD-10-CM | POA: Insufficient documentation

## 2018-11-15 DIAGNOSIS — W57XXXA Bitten or stung by nonvenomous insect and other nonvenomous arthropods, initial encounter: Secondary | ICD-10-CM | POA: Insufficient documentation

## 2018-11-15 DIAGNOSIS — L03019 Cellulitis of unspecified finger: Secondary | ICD-10-CM

## 2018-11-15 LAB — CBC
HCT: 39.9 % (ref 36.0–46.0)
Hemoglobin: 13.2 g/dL (ref 12.0–15.0)
MCH: 29.3 pg (ref 26.0–34.0)
MCHC: 33.1 g/dL (ref 30.0–36.0)
MCV: 88.7 fL (ref 80.0–100.0)
Platelets: 350 10*3/uL (ref 150–400)
RBC: 4.5 MIL/uL (ref 3.87–5.11)
RDW: 12.3 % (ref 11.5–15.5)
WBC: 8.6 10*3/uL (ref 4.0–10.5)
nRBC: 0 % (ref 0.0–0.2)

## 2018-11-15 LAB — COMPREHENSIVE METABOLIC PANEL
ALT: 16 U/L (ref 0–44)
AST: 19 U/L (ref 15–41)
Albumin: 4.2 g/dL (ref 3.5–5.0)
Alkaline Phosphatase: 57 U/L (ref 38–126)
Anion gap: 12 (ref 5–15)
BUN: 13 mg/dL (ref 6–20)
CO2: 25 mmol/L (ref 22–32)
Calcium: 9.5 mg/dL (ref 8.9–10.3)
Chloride: 104 mmol/L (ref 98–111)
Creatinine, Ser: 0.57 mg/dL (ref 0.44–1.00)
GFR calc Af Amer: >60 mL/min (ref >60)
GFR calc non Af Amer: >60 mL/min (ref >60)
Glucose, Bld: 99 mg/dL (ref 70–99)
Potassium: 3.6 mmol/L (ref 3.5–5.1)
Sodium: 141 mmol/L (ref 135–145)
Total Bilirubin: 0.3 mg/dL (ref 0.3–1.2)
Total Protein: 7.4 g/dL (ref 6.5–8.1)

## 2018-11-15 LAB — CK: Total CK: 41 U/L (ref 38–234)

## 2018-11-15 MED ORDER — CLINDAMYCIN PHOSPHATE 600 MG/50ML IV SOLN
600.0000 mg | Freq: Once | INTRAVENOUS | Status: AC
  Administered 2018-11-15: 600 mg via INTRAVENOUS
  Filled 2018-11-15: qty 50

## 2018-11-15 MED ORDER — DIPHENHYDRAMINE HCL 50 MG/ML IJ SOLN
25.0000 mg | Freq: Once | INTRAMUSCULAR | Status: AC
  Administered 2018-11-15: 15:00:00 25 mg via INTRAVENOUS
  Filled 2018-11-15: qty 1

## 2018-11-15 MED ORDER — METHYLPREDNISOLONE SODIUM SUCC 125 MG IJ SOLR
125.0000 mg | Freq: Once | INTRAMUSCULAR | Status: AC
  Administered 2018-11-15: 125 mg via INTRAVENOUS
  Filled 2018-11-15: qty 2

## 2018-11-15 MED ORDER — CLINDAMYCIN HCL 300 MG PO CAPS: 300.0000 mg | ORAL_CAPSULE | Freq: Four times a day (QID) | ORAL | 0 refills | Status: AC

## 2018-11-15 MED ORDER — SODIUM CHLORIDE 0.9 % IV BOLUS
500.0000 mL | Freq: Once | INTRAVENOUS | Status: AC
  Administered 2018-11-15: 15:00:00 500 mL via INTRAVENOUS

## 2018-11-15 NOTE — ED Provider Notes (Signed)
[facility]    CSN: [id number] Arrival date & time: 11/15/18  1217      History   Chief Complaint Chief Complaint  Patient presents with  . Insect Bite    HPI [name] is a 48 y.o. female presenting with a probable insect bite to right middle finger. Pt did not see what caused the bite. 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 Upon awakening, pt noticed the redness has spread to geniture of finger and has caused a swelling to finger as well. Pt reports decreased sensation to finger and decreased ROM. Tetanus UTD.   Past Medical History:  Diagnosis Date  . History of palpitations   . Migraine   . Mitral valve prolapse     Patient Active Problem List   Diagnosis Date Noted  . Tachycardia 04/07/2016  . Abdominal pain, epigastric   . Nausea   . Chest pressure 08/29/2014  . Murmur 08/29/2014  . Other migraine without status migrainosus, not intractable 08/29/2014    Past Surgical History:  Procedure Laterality Date  . APPENDECTOMY    . CESAREAN SECTION    . ESOPHAGOGASTRODUODENOSCOPY (EGD) WITH PROPOFOL N/A 04/07/2016   Procedure: ESOPHAGOGASTRODUODENOSCOPY (EGD) WITH PROPOFOL;  Surgeon: Lucilla Lame, MD;  Location: [facility] ENDOSCOPY;  Service: Endoscopy;  Laterality: N/A;  . TONSILLECTOMY      OB History   No obstetric history on file.      Home Medications    Prior to Admission medications   Medication Sig Start Date End Date Taking? Authorizing Provider  acetaminophen (TYLENOL) 325 MG tablet Take 650 mg by mouth every 6 (six) hours as needed for mild pain or headache.   Yes [provider]  ibuprofen (ADVIL,MOTRIN) 200 MG tablet Take 400 mg by mouth every 6 (six) hours as needed for headache or mild pain.   Yes [provider]  Multiple Vitamins tablet Take 1 tablet by mouth daily.    Yes [provider]  ondansetron (ZOFRAN-ODT) 8 MG disintegrating tablet Take 8 mg by mouth every 8 (eight)  hours as needed for nausea or vomiting.  08/04/14  Yes [provider]  rizatriptan (MAXALT) 10 MG tablet Take 10 mg by mouth as needed for migraine. May repeat in 2 hours if needed   Yes [provider]  ciprofloxacin (CIPRO) 250 MG tablet Take 1 tablet (250 mg total) by mouth 2 (two) times daily. Patient not taking: Reported on 03/30/2017 03/11/17   Versie Starks, PA-C  doxycycline (VIBRA-TABS) 100 MG tablet Take 1 tablet (100 mg total) by mouth 2 (two) times daily. Patient not taking: Reported on 03/11/2017 11/27/16   Versie Starks, PA-C  fluconazole (DIFLUCAN) 150 MG tablet Use if yeast Patient not taking: Reported on 03/11/2017 11/27/16   Versie Starks, PA-C    Family History Family History  Problem Relation Age of Onset  . Heart failure Father   . Breast cancer Maternal Aunt        mother's half sister    Social History Social History   Tobacco Use  . Smoking status: Never Smoker  . Smokeless tobacco: Never Used  Substance Use Topics  . Alcohol use: No  . Drug use: No     Allergies   Patient has no known allergies.   Review of Systems Review of Systems  Skin:       Non-blanching erythema with purple discoloration to right middle finger  All other  systems reviewed and are negative.    Physical Exam Triage Vital Signs ED Triage Vitals  Enc Vitals Group     BP 11/15/18 1259 123/86     Pulse Rate 11/15/18 1259 78     Resp 11/15/18 1259 16     Temp 11/15/18 1259 97.7 F (36.5 C)     Temp Source 11/15/18 1259 Oral     SpO2 11/15/18 1259 100 %     Weight 11/15/18 1256 127 lb (57.6 kg)     Height 11/15/18 1256 5\' 2"  (1.575 m)     Head Circumference --      Peak Flow --      Pain Score 11/15/18 1255 3     Pain Loc --      Pain Edu? --      Excl. in GC? --    No data found.  Updated Vital Signs BP 123/86 (BP Location: Left Arm)   Pulse 78   Temp 97.7 F (36.5 C) (Oral)   Resp 16   Ht 5\' 2"  (1.575 m)   Wt 127 lb (57.6 kg)   SpO2  100%   BMI 23.23 kg/m    Physical Exam Vitals signs and nursing note reviewed.  Cardiovascular:     Pulses: Normal pulses.  Skin:    Capillary Refill: Capillary refill takes more than 3 seconds.           UC Treatments / Results  Labs (all labs ordered are listed, but only abnormal results are displayed) Labs Reviewed - No data to display  EKG None  Radiology No results found.  Procedures Procedures (including critical care time)  Medications Ordered in UC Medications - No data to display  Initial Impression / Assessment and Plan / UC Course  I have reviewed the triage vital signs and the nursing notes.  Pertinent labs & imaging results that were available during my care of the patient were reviewed by me and considered in my medical decision making (see chart for details).     Pt presents with incest bite to right middle finger. Due to severity and quick progression of swelling and erythema, this NP concerned of  severe soft tissue cellulitis/felon. Sent to ER for further evaluation and treatment. Pt agreed.  Final Clinical Impressions(s) / UC Diagnoses   Final diagnoses:  Cellulitis of finger, unspecified laterality   Discharge Instructions   None    ED Prescriptions    None       Bailey MechBenjamin, Nirvi Boehler, NP 11/15/18 1325

## 2018-11-15 NOTE — ED Triage Notes (Addendum)
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 States that she never saw anything bite her but felt a sting after putting her hand around a rail. Patient states that this morning she has started to notice an increase in swelling and redness. The pain is on her middle right finger and swelling down in to her hand.

## 2018-11-15 NOTE — ED Triage Notes (Signed)
States something bit her 3rd digit L hand yesterday. Swollen and red now.

## 2018-11-15 NOTE — Discharge Instructions (Signed)
Please follow-up with primary care, wound center, and hand surgery tomorrow or Thursday.  Please return to the emergency department tomorrow if swelling worsens and you are unable to get into see primary care, wound center or hand surgery.  You can take another dose of antibiotics tonight.  You can continue Benadryl.  Ice finger.

## 2018-11-15 NOTE — ED Notes (Signed)
See triage note  Presents with possible insect bite   Left 3 rd digit swollen

## 2018-11-15 NOTE — ED Provider Notes (Signed)
[facility] Emergency Department Provider Note  ____________________________________________  Time seen: Approximately 2:22 PM  I have reviewed the triage vital signs and the nursing notes.   HISTORY  Chief Complaint Hand Pain    HPI [name] is a 48 y.o. female that presents to the emergency department for evaluation of possible insect bite to right middle finger that happened yesterday.  Patient states that she was grabbing a gait and felt a bite.  She did not see an insect.  Last night finger was mildly swollen and she was able to see what looked like to bite marks.  This morning swelling increased and patient noticed some blisters and black area to finger.  No systemic symptoms.  Patient went to urgent care prior and was recommended to come to the emergency department.  No fevers, shortness of breath, chest pain.   Past Medical History:  Diagnosis Date  . History of palpitations   . Migraine   . Mitral valve prolapse     Patient Active Problem List   Diagnosis Date Noted  . Tachycardia 04/07/2016  . Abdominal pain, epigastric   . Nausea   . Chest pressure 08/29/2014  . Murmur 08/29/2014  . Other migraine without status migrainosus, not intractable 08/29/2014    Past Surgical History:  Procedure Laterality Date  . APPENDECTOMY    . CESAREAN SECTION    . ESOPHAGOGASTRODUODENOSCOPY (EGD) WITH PROPOFOL N/A 04/07/2016   Procedure: ESOPHAGOGASTRODUODENOSCOPY (EGD) WITH PROPOFOL;  Surgeon: Lucilla Lame, MD;  Location: [facility] ENDOSCOPY;  Service: Endoscopy;  Laterality: N/A;  . TONSILLECTOMY      Prior to Admission medications   Medication Sig Start Date End Date Taking? Authorizing Provider  acetaminophen (TYLENOL) 325 MG tablet Take 650 mg by mouth every 6 (six) hours as needed for mild pain or headache.    [provider]  clindamycin (CLEOCIN) 300 MG capsule Take 1 capsule (300 mg total) by mouth 4 (four) times daily for 10  days. 11/15/18 11/25/18  Laban Emperor, PA-C  ibuprofen (ADVIL,MOTRIN) 200 MG tablet Take 400 mg by mouth every 6 (six) hours as needed for headache or mild pain.    [provider]  Multiple Vitamins tablet Take 1 tablet by mouth daily.     [provider]  ondansetron (ZOFRAN-ODT) 8 MG disintegrating tablet Take 8 mg by mouth every 8 (eight) hours as needed for nausea or vomiting.  08/04/14   [provider]  rizatriptan (MAXALT) 10 MG tablet Take 10 mg by mouth as needed for migraine. May repeat in 2 hours if needed    [provider]    Allergies Patient has no known allergies.  Family History  Problem Relation Age of Onset  . Heart failure Father   . Breast cancer Maternal Aunt        mother's half sister    Social History Social History   Tobacco Use  . Smoking status: Never Smoker  . Smokeless tobacco: Never Used  Substance Use Topics  . Alcohol use: No  . Drug use: No     Review of Systems  Constitutional: No fever/chills Cardiovascular: No chest pain. Respiratory: No SOB. Gastrointestinal: No nausea, no vomiting.  Musculoskeletal: Positive for finger pain. Skin:  Positive for rash and ecchymosis. Neurological: Negative for headaches, numbness or tingling   ____________________________________________   PHYSICAL EXAM:  VITAL SIGNS: ED Triage Vitals  Enc Vitals Group     BP 11/15/18 1333 (!) 148/65  Pulse Rate 11/15/18 1333 79     Resp 11/15/18 1333 20     Temp 11/15/18 1333 98 F (36.7 C)     Temp Source 11/15/18 1333 Oral     SpO2 11/15/18 1333 100 %     Weight 11/15/18 1336 127 lb (57.6 kg)     Height 11/15/18 1336 5\' 2"  (1.575 m)     Head Circumference --      Peak Flow --      Pain Score 11/15/18 1335 4     Pain Loc --      Pain Edu? --      Excl. in GC? --      Constitutional: Alert and oriented. Well appearing and in no acute distress. Eyes: Conjunctivae are normal. PERRL. EOMI. Head:  Atraumatic. ENT:      Ears:      Nose: No congestion/rhinnorhea.      Mouth/Throat: Mucous membranes are moist.  Neck: No stridor. Cardiovascular: Normal rate, regular rhythm.  Good peripheral circulation. Respiratory: Normal respiratory effort without tachypnea or retractions. Lungs CTAB. Good air entry to the bases with no decreased or absent breath sounds. Musculoskeletal: Full range of motion to all extremities. No gross deformities appreciated. Neurologic:  Normal speech and language. No gross focal neurologic deficits are appreciated.  Skin:  Skin is warm, dry and intact.  1 mm x 1 cm linear area of ecchymosis with 2 vesicles to right mid lateral 3rd finger.  Moderate swelling to right finger, worse between DIP and PIP joint.  No tenderness along flexor surface.  Finger not held in flexion.  No tenderness to finger pad.  No palpable fluctuance. Psychiatric: Mood and affect are normal. Speech and behavior are normal. Patient exhibits appropriate insight and judgement.   ____________________________________________   LABS (all labs ordered are listed, but only abnormal results are displayed)  Labs Reviewed  CULTURE, BLOOD (ROUTINE X 2)  CULTURE, BLOOD (ROUTINE X 2)  CBC  COMPREHENSIVE METABOLIC PANEL  CK   ____________________________________________  EKG   ____________________________________________  RADIOLOGY Lexine BatonI, Shirleen Mcfaul, personally viewed and evaluated these images (plain radiographs) as part of my medical decision making, as well as reviewing the written report by the radiologist.  Dg Hand Complete Right  Result Date: 11/15/2018 CLINICAL DATA:  Insect bite and middle finger. EXAM: RIGHT HAND - COMPLETE 3+ VIEW COMPARISON:  None. FINDINGS: There is soft tissue swelling about the third digit without evidence of a radiopaque foreign body or osteomyelitis. There is no displaced fracture. No dislocation. IMPRESSION: Soft tissue swelling about the third digit without  evidence of an acute osseous abnormality. Electronically Signed   By: Katherine Mantlehristopher  Green M.D.   On: 11/15/2018 15:01    ____________________________________________    PROCEDURES  Procedure(s) performed:    Procedures    Medications  clindamycin (CLEOCIN) IVPB 600 mg (0 mg Intravenous Stopped 11/15/18 1552)  sodium chloride 0.9 % bolus 500 mL (0 mLs Intravenous Stopped 11/15/18 1613)  diphenhydrAMINE (BENADRYL) injection 25 mg (25 mg Intravenous Given 11/15/18 1521)  methylPREDNISolone sodium succinate (SOLU-MEDROL) 125 mg/2 mL injection 125 mg (125 mg Intravenous Given 11/15/18 1613)     ____________________________________________   INITIAL IMPRESSION / ASSESSMENT AND PLAN / ED COURSE  Pertinent labs & imaging results that were available during my care of the patient were reviewed by me and considered in my medical decision making (see chart for details).  Review of the [place] CSRS was performed in accordance of the [facility] prior to dispensing  any controlled drugs.     Patient presented to emergency department for evaluation of possible insect bite to left third finger.  Vital signs and lab work are reassuring.  Bite does look consistent with possibly a brown recluse.  CBC, CMP, CBC all within normal limits.  IV fluids, IV clindamycin, IV Solu-Medrol and IV Benadryl were given.  No indication of flexor tenosynovitis or felon.  Patient will be discharged home with prescriptions for clindamycin. Patient is to follow up with wound care or hand surgery as directed.  Patient will return the emergency department tomorrow if symptoms are worsening.  Patient is given ED precautions to return to the ED for any worsening or new symptoms.  [name] was evaluated in Emergency Department on 11/15/2018 for the symptoms described in the history of present illness. She was evaluated in the context of the global COVID-19 pandemic, which necessitated consideration that the patient might be at  risk for infection with the SARS-CoV-2 virus that causes COVID-19. Institutional protocols and algorithms that pertain to the evaluation of patients at risk for COVID-19 are in a state of rapid change based on information released by regulatory bodies including the CDC and federal and state organizations. These policies and algorithms were followed during the patient's care in the ED.     ____________________________________________  FINAL CLINICAL IMPRESSION(S) / ED DIAGNOSES  Final diagnoses:  Insect bite of right middle finger, initial encounter      NEW MEDICATIONS STARTED DURING THIS VISIT:  ED Discharge Orders         Ordered    clindamycin (CLEOCIN) 300 MG capsule  4 times daily     11/15/18 1603              This chart was dictated using voice recognition software/Dragon. Despite best efforts to proofread, errors can occur which can change the meaning. Any change was purely unintentional.    Enid DerryWagner, Cylinda Santoli, PA-C 11/15/18 2048    Willy Eddyobinson, Patrick, MD 11/16/18 [phone]

## 2018-11-16 ENCOUNTER — Encounter: Payer: Self-pay | Attending: Physician Assistant | Admitting: Physician Assistant

## 2018-11-16 DIAGNOSIS — Z8249 Family history of ischemic heart disease and other diseases of the circulatory system: Secondary | ICD-10-CM | POA: Insufficient documentation

## 2018-11-16 DIAGNOSIS — Z820 Family history of epilepsy and other diseases of the nervous system: Secondary | ICD-10-CM | POA: Insufficient documentation

## 2018-11-16 DIAGNOSIS — Z809 Family history of malignant neoplasm, unspecified: Secondary | ICD-10-CM | POA: Insufficient documentation

## 2018-11-16 DIAGNOSIS — S60561A Insect bite (nonvenomous) of right hand, initial encounter: Secondary | ICD-10-CM | POA: Insufficient documentation

## 2018-11-16 NOTE — Progress Notes (Signed)
NATIA, FAHMY ([id number]) Visit Report for 11/16/2018 Abuse/Suicide Risk Screen Details Patient Name: [name]. Date of Service: 11/16/2018 10:30 AM Medical Record Number: [id number] Patient Account Number: [id number] Date of Birth/Sex: 04-20-1971 (48 y.o. F) Treating RN: Army Melia Primary Care Mckensie Scotti: Hortencia Pilar Other Clinician: Referring Reynaldo Rossman: Merlyn Lot Treating Tonie Vizcarrondo/Extender: Melburn Hake, HOYT Weeks in Treatment: 0 Abuse/Suicide Risk Screen Items Answer ABUSE RISK SCREEN: Has anyone close to you tried to hurt or harm you recentlyo No Do you feel uncomfortable with anyone in your familyo No Has anyone forced you do things that you didnot want to doo No Electronic Signature(s) Signed: 11/16/2018 11:42:34 AM By: Army Melia Entered By: Army Melia on 11/16/2018 11:08:59 [name] ([id number]) -------------------------------------------------------------------------------- Activities of Daily Living Details Patient Name: [name], [name] Date of Service: 11/16/2018 10:30 AM Medical Record Number: [id number] Patient Account Number: [id number] Date of Birth/Sex: [birth year] (48 y.o. F) Treating RN: Army Melia Primary Care Colena Ketterman: Hortencia Pilar Other Clinician: Referring Honest Vanleer: Merlyn Lot Treating Abenezer Odonell/Extender: Melburn Hake, HOYT Weeks in Treatment: 0 Activities of Daily Living Items Answer Activities of Daily Living (Please select one for each item) Drive Automobile Completely Able Take Medications Completely Able Use Telephone Completely Able Care for Appearance Completely Able Use Toilet Completely Able Bath / Shower Completely Able Dress Self Completely Able Feed Self Completely Able Walk Completely Able Get In / Out Bed Completely Able Housework Completely Able Prepare Meals Completely Able Handle Money Completely Able Shop for Self Completely Able Electronic Signature(s) Signed:  11/16/2018 11:42:34 AM By: Army Melia Entered By: Army Melia on 11/16/2018 11:09:11 [name] ([id number]) -------------------------------------------------------------------------------- Education Screening Details Patient Name: Leslie Pert B. Date of Service: 11/16/2018 10:30 AM Medical Record Number: [id number] Patient Account Number: [id number] Date of Birth/Sex: September 08, 1970 (47 y.o. F) Treating RN: Army Melia Primary Care Kacelyn Rowzee: Hortencia Pilar Other Clinician: Referring Harneet Noblett: Merlyn Lot Treating Trew Sunde/Extender: Melburn Hake, HOYT Weeks in Treatment: 0 Primary Learner Assessed: Patient Learning Preferences/Education Level/Primary Language Learning Preference: Explanation, Demonstration Highest Education Level: College or Above Preferred Language: English Cognitive Barrier Language Barrier: No Translator Needed: No Memory Deficit: No Emotional Barrier: No Cultural/Religious Beliefs Affecting Medical Care: No Physical Barrier Impaired Vision: No Impaired Hearing: No Decreased Hand dexterity: No Knowledge/Comprehension Knowledge Level: High Comprehension Level: High Ability to understand written High instructions: Ability to understand verbal High instructions: Motivation Anxiety Level: Calm Cooperation: Cooperative Education Importance: Acknowledges Need Interest in Health Problems: Asks Questions Perception: Coherent Willingness to Engage in Self- High Management Activities: Readiness to Engage in Self- High Management Activities: Electronic Signature(s) Signed: 11/16/2018 11:42:34 AM By: Army Melia Entered By: Army Melia on 11/16/2018 11:09:35 [name] ([id number]) -------------------------------------------------------------------------------- Fall Risk Assessment Details Patient Name: Leslie Pert B. Date of Service: 11/16/2018 10:30 AM Medical Record Number: [id number] Patient Account  Number: [id number] Date of Birth/Sex: 1970-08-08 (48 y.o. F) Treating RN: Army Melia Primary Care Dainelle Hun: Hortencia Pilar Other Clinician: Referring Qamar Aughenbaugh: Merlyn Lot Treating Kateena Degroote/Extender: Melburn Hake, HOYT Weeks in Treatment: 0 Fall Risk Assessment Items Have you had 2 or more falls in the last 12 monthso 0 No Have you had any fall that resulted in injury in the last 12 monthso 0 No FALLS RISK SCREEN History of falling - immediate or within 3 months 0 No Secondary diagnosis (Do you have 2 or more medical diagnoseso) 0 No Ambulatory aid None/bed rest/wheelchair/nurse 0 No Crutches/cane/walker 0 No Furniture 0 No Intravenous therapy Access/Saline/Heparin Lock 0 No Gait/Transferring Normal/ bed rest/ wheelchair 0 No  Weak (short steps with or without shuffle, stooped but able to lift head while 0 No walking, may seek support from furniture) Impaired (short steps with shuffle, may have difficulty arising from chair, head 0 No down, impaired balance) Mental Status Oriented to own ability 0 No Electronic Signature(s) Signed: 11/16/2018 11:42:34 AM By: Rodell PernaScott, Dajea Entered By: Rodell PernaScott, Dajea on 11/16/2018 11:09:43 Fredric MareICHARDSON, Luba B. ([id number]) -------------------------------------------------------------------------------- Foot Assessment Details Patient Name: Claudia PollockICHARDSON, Leslie B. Date of Service: 11/16/2018 10:30 AM Medical Record Number: [id number] Patient Account Number: [id number] Date of Birth/Sex: 1970/12/31 (48 y.o. F) Treating RN: Rodell PernaScott, Dajea Primary Care Kenishia Plack: Rolm GalaGrandis, Heidi Other Clinician: Referring Shacoya Burkhammer: Willy EddyOBINSON, PATRICK Treating Jari Carollo/Extender: Linwood DibblesSTONE III, HOYT Weeks in Treatment: 0 Foot Assessment Items Site Locations + = Sensation present, - = Sensation absent, C = Callus, U = Ulcer R = Redness, W = Warmth, M = Maceration, PU = Pre-ulcerative lesion F = Fissure, S = Swelling, D = Dryness Assessment Right: Left: Other Deformity:  No No Prior Foot Ulcer: No No Prior Amputation: No No Charcot Joint: No No Ambulatory Status: Ambulatory Without Help Gait: Steady Electronic Signature(s) Signed: 11/16/2018 11:42:34 AM By: Rodell PernaScott, Dajea Entered By: Rodell PernaScott, Dajea on 11/16/2018 11:10:09 Fredric MareICHARDSON, Josanne B. ([id number]) -------------------------------------------------------------------------------- Nutrition Risk Screening Details Patient Name: Claudia PollockICHARDSON, Horace B. Date of Service: 11/16/2018 10:30 AM Medical Record Number: [id number] Patient Account Number: [id number] Date of Birth/Sex: 1970/12/31 (48 y.o. F) Treating RN: Rodell PernaScott, Dajea Primary Care Maelyn Berrey: Rolm GalaGrandis, Heidi Other Clinician: Referring Nannette Zill: Willy EddyOBINSON, PATRICK Treating Ermie Glendenning/Extender: Linwood DibblesSTONE III, HOYT Weeks in Treatment: 0 Height (in): 62 Weight (lbs): 128.5 Body Mass Index (BMI): 23.5 Nutrition Risk Screening Items Score Screening NUTRITION RISK SCREEN: I have an illness or condition that made me change the kind and/or amount of 0 No food I eat I eat fewer than two meals per day 0 No I eat few fruits and vegetables, or milk products 0 No I have three or more drinks of beer, liquor or wine almost every day 0 No I have tooth or mouth problems that make it hard for me to eat 0 No I don't always have enough money to buy the food I need 0 No I eat alone most of the time 0 No I take three or more different prescribed or over-the-counter drugs a day 0 No Without wanting to, I have lost or gained 10 pounds in the last six months 0 No I am not always physically able to shop, cook and/or feed myself 0 No Nutrition Protocols Good Risk Protocol 0 No interventions needed Moderate Risk Protocol High Risk Proctocol Risk Level: Good Risk Score: 0 Electronic Signature(s) Signed: 11/16/2018 11:42:34 AM By: Rodell PernaScott, Dajea Entered By: Rodell PernaScott, Dajea on 11/16/2018 11:09:51

## 2018-11-17 NOTE — Progress Notes (Signed)
[name], Kooper B. ([id number]) Visit Report for 11/16/2018 Chief Complaint Document Details Patient Name: [name], Leslie B. Date of Service: 11/16/2018 10:30 AM Medical Record Number: [id number] Patient Account Number: [id number] Date of Birth/Sex: 04-Aug-1970 (48 y.o. F) Treating RN: Curtis Sitesorthy, Joanna Primary Care Provider: Rolm GalaGrandis, Heidi Other Clinician: Referring Provider: Willy EddyOBINSON, PATRICK Treating Provider/Extender: Linwood DibblesSTONE III, HOYT Weeks in Treatment: 0 Information Obtained from: Patient Chief Complaint Right hand insect bite Electronic Signature(s) Signed: 11/17/2018 6:29:07 AM By: Lenda KelpStone III, Hoyt PA-C Entered By: Lenda KelpStone III, Hoyt on 11/16/2018 17:42:35 Beecham, Courtney ParisELIZABETH B. ([id number]) -------------------------------------------------------------------------------- HPI Details Patient Name: [name], Meelah B. Date of Service: 11/16/2018 10:30 AM Medical Record Number: [id number] Patient Account Number: [id number] Date of Birth/Sex: 04-Aug-1970 (48 y.o. F) Treating RN: Curtis Sitesorthy, Joanna Primary Care Provider: Rolm GalaGrandis, Heidi Other Clinician: Referring Provider: Willy EddyOBINSON, PATRICK Treating Provider/Extender: Linwood DibblesSTONE III, HOYT Weeks in Treatment: 0 History of Present Illness HPI Description: 11/16/18 on evaluation today patient presents for initial evaluation our clinic concerning issues that she is having with her right hand where she presumably based on history was stung or bitten by some type of insect. There is actually some bruising/purplish discoloration noted but I do not see any obvious bite marks upon evaluation inspection today. Nonetheless there was at least a concern during her evaluation in the emergency department that this could have been a brown recluse. Nonetheless if this is indeed a brown recluse fight it will likely not surface to be such definitively until 7 to 14 days as I discussed with the patient when she begins to develop necrosis. Subsequently as of  right now there does not appear to be the obvious sign of an active open wound which is good news. No fevers, chills, nausea, or vomiting noted at this time. She is on clindamycin I prescribed at the emergency department at this time. She is also been referred to a hand specialist who she will be seeing on Thursday. 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 Psychologist, prison and probation serviceslectronic Signature(s) Signed: 11/17/2018 6:29:07 AM By: Lenda KelpStone III, Hoyt PA-C Entered By: Lenda KelpStone III, Hoyt on 11/16/2018 17:43:46 Polan, Courtney ParisELIZABETH B. ([id number]) -------------------------------------------------------------------------------- Physical Exam Details Patient Name: [name], Leslie B. Date of Service: 11/16/2018 10:30 AM Medical Record Number: [id number] Patient Account Number: [id number] Date of Birth/Sex: 04-Aug-1970 (48 y.o. F) Treating RN: Curtis Sitesorthy, Joanna Primary Care Provider: Rolm GalaGrandis, Heidi Other Clinician: Referring Provider: Willy EddyOBINSON, PATRICK Treating Provider/Extender: Linwood DibblesSTONE III, HOYT Weeks in Treatment: 0 Constitutional sitting or standing blood pressure is within target range for patient.. pulse regular and within target range for patient.Marland Kitchen. respirations regular, non-labored and within target range for patient.Marland Kitchen. temperature within target range for patient.. Well- nourished and well-hydrated in no acute distress. Eyes conjunctiva clear no eyelid edema noted. pupils equal round and reactive to light and accommodation. Ears, Nose, Mouth, and Throat no gross abnormality of ear auricles or external auditory canals. normal hearing noted during conversation. mucus membranes moist. Respiratory normal breathing without difficulty. clear to auscultation bilaterally. Cardiovascular regular rate and rhythm with normal S1, S2. no clubbing, cyanosis, significant edema, <3 sec cap refill. Gastrointestinal (GI) soft, non-tender, non-distended, +BS. no ventral hernia noted. Musculoskeletal normal  gait and posture. no significant deformity or arthritic changes, no loss or range of motion, no clubbing. Psychiatric this patient is able to make decisions and demonstrates good insight into disease process. Alert and Oriented x 3. pleasant and cooperative. Notes Upon evaluation inspection today patient did have discoloration of her and noted specifically in the central portion of  her finger which does not appear to be showing any signs of restricting blood flow at this time. She had good capillary refill distal to the obvious bruising this was swollen compared to her other fingers and again there did not appear to be open wound nor necrosis at this time which is good news. She otherwise is in good health and is not having any other associated symptoms. Fortunately this overall appears to likely be a hopefully self-limiting condition although again if this is a brown recluse by she could just not develop the necrosis as of yet time will tell in that regard. Electronic Signature(s) Signed: 11/17/2018 6:29:07 AM By: Lenda KelpStone III, Hoyt PA-C Entered By: Lenda KelpStone III, Hoyt on 11/16/2018 17:44:52 Holden, Courtney ParisELIZABETH B. ([id number]) -------------------------------------------------------------------------------- Physician Orders Details Patient Name: [name], Leslie B. Date of Service: 11/16/2018 10:30 AM Medical Record Number: [id number] Patient Account Number: [id number] Date of Birth/Sex: 1971/05/25 (48 y.o. F) Treating RN: Curtis Sitesorthy, Joanna Primary Care Provider: Rolm GalaGrandis, Heidi Other Clinician: Referring Provider: Willy EddyOBINSON, PATRICK Treating Provider/Extender: Linwood DibblesSTONE III, HOYT Weeks in Treatment: 0 Verbal / Phone Orders: No Diagnosis Coding Follow-up Appointments o Return Appointment in 1 week. Electronic Signature(s) Signed: 11/16/2018 4:26:15 PM By: Curtis Sitesorthy, Joanna Signed: 11/17/2018 6:29:07 AM By: Lenda KelpStone III, Hoyt PA-C Entered By: Curtis Sitesorthy, Joanna on 11/16/2018 11:27:03 [name], Yocheved  B. ([id number]) -------------------------------------------------------------------------------- Problem List Details Patient Name: [name], Kyrstin B. Date of Service: 11/16/2018 10:30 AM Medical Record Number: [id number] Patient Account Number: [id number] Date of Birth/Sex: 1971/05/25 (48 y.o. F) Treating RN: Curtis Sitesorthy, Joanna Primary Care Provider: Rolm GalaGrandis, Heidi Other Clinician: Referring Provider: Willy EddyOBINSON, PATRICK Treating Provider/Extender: Linwood DibblesSTONE III, HOYT Weeks in Treatment: 0 Active Problems ICD-10 Evaluated Encounter Code Description Active Date Today Diagnosis [phone] Toxic effect of venom of other arthropod, accidental 11/16/2018 No Yes (unintentional), initial encounter M79.644 Pain in right finger(s) 11/16/2018 No Yes Inactive Problems Resolved Problems Electronic Signature(s) Signed: 11/17/2018 6:29:07 AM By: Lenda KelpStone III, Hoyt PA-C Entered By: Lenda KelpStone III, Hoyt on 11/16/2018 17:42:05 Shearn, Courtney ParisELIZABETH B. ([id number]) -------------------------------------------------------------------------------- Progress Note Details Patient Name: Claudia PollockICHARDSON, Lynnzie B. Date of Service: 11/16/2018 10:30 AM Medical Record Number: [id number] Patient Account Number: [id number] Date of Birth/Sex: 1971/05/25 ([age] y.o. F) Treating RN: Curtis Sitesorthy, Joanna Primary Care Provider: Rolm GalaGrandis, Heidi Other Clinician: Referring Provider: Willy EddyOBINSON, PATRICK Treating Provider/Extender: Linwood DibblesSTONE III, HOYT Weeks in Treatment: 0 Subjective Chief Complaint Information obtained from Patient Right hand insect bite History of Present Illness (HPI) 11/16/18 on evaluation today patient presents for initial evaluation our clinic concerning issues that she is having with her right hand where she presumably based on history was stung or bitten by some type of insect. There is actually some bruising/purplish discoloration noted but I do not see any obvious bite marks upon evaluation inspection today. Nonetheless there  was at least a concern during her evaluation in the emergency department that this could have been a brown recluse. Nonetheless if this is indeed a brown recluse fight it will likely not surface to be such definitively until 7 to 14 days as I discussed with the patient when she begins to develop necrosis. Subsequently as of right now there does not appear to be the obvious sign of an active open wound which is good news. No fevers, chills, nausea, or vomiting noted at this time. She is on clindamycin I prescribed at the emergency department at this time. She is also been referred to a hand specialist who she will be seeing on Thursday. 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  Patient History Information obtained from Patient. Allergies No Known Allergies Family History Cancer - Father, Heart Disease - Father, Seizures - Mother, No family history of Diabetes, Hypertension, Kidney Disease, Lung Disease, Stroke, Thyroid Problems, Tuberculosis. Social History Never smoker, Marital Status - Married, Alcohol Use - Never, Drug Use - No History, Caffeine Use - Daily - coffee. Review of Systems (ROS) Eyes Complains or has symptoms of Glasses / Contacts. Denies complaints or symptoms of Dry Eyes, Vision Changes. Ear/Nose/Mouth/Throat Denies complaints or symptoms of Difficult clearing ears, Sinusitis. Hematologic/Lymphatic Denies complaints or symptoms of Bleeding / Clotting Disorders, Human Immunodeficiency Virus. Respiratory Denies complaints or symptoms of Chronic or frequent coughs, Shortness of Breath. Cardiovascular Denies complaints or symptoms of Chest pain, LE edema. Gastrointestinal Denies complaints or symptoms of Frequent diarrhea, Nausea, Vomiting. Endocrine Denies complaints or symptoms of Hepatitis, Thyroid disease, Polydypsia (Excessive Thirst). PORSCHA, AXLEY ([id number]) Genitourinary Denies complaints or symptoms of Kidney failure/ Dialysis,  Incontinence/dribbling. Immunological Denies complaints or symptoms of Hives, Itching. Integumentary (Skin) Denies complaints or symptoms of Wounds, Bleeding or bruising tendency, Breakdown, Swelling. Musculoskeletal Denies complaints or symptoms of Muscle Pain, Muscle Weakness. Neurologic Denies complaints or symptoms of Numbness/parasthesias, Focal/Weakness. Psychiatric Denies complaints or symptoms of Anxiety, Claustrophobia. Objective Constitutional sitting or standing blood pressure is within target range for patient.. pulse regular and within target range for patient.Marland Kitchen respirations regular, non-labored and within target range for patient.Marland Kitchen temperature within target range for patient.. Well- nourished and well-hydrated in no acute distress. Vitals Time Taken: 10:54 AM, Height: 62 in, Source: Stated, Weight: 128.5 lbs, Source: Measured, BMI: 23.5, Temperature: 98.2 F, Pulse: 97 bpm, Respiratory Rate: 16 breaths/min, Blood Pressure: 125/62 mmHg. Eyes conjunctiva clear no eyelid edema noted. pupils equal round and reactive to light and accommodation. Ears, Nose, Mouth, and Throat no gross abnormality of ear auricles or external auditory canals. normal hearing noted during conversation. mucus membranes moist. Respiratory normal breathing without difficulty. clear to auscultation bilaterally. Cardiovascular regular rate and rhythm with normal S1, S2. no clubbing, cyanosis, significant edema, Gastrointestinal (GI) soft, non-tender, non-distended, +BS. no ventral hernia noted. Musculoskeletal normal gait and posture. no significant deformity or arthritic changes, no loss or range of motion, no clubbing. Psychiatric this patient is able to make decisions and demonstrates good insight into disease process. Alert and Oriented x 3. pleasant and cooperative. General Notes: Upon evaluation inspection today patient did have discoloration of her and noted specifically in the  central portion of her finger which does not appear to be showing any signs of restricting blood flow at this time. She had good capillary refill distal to the obvious bruising this was swollen compared to her other fingers and again there did not appear to be open wound nor necrosis at this time which is good news. She otherwise is in good health and is not having any other LLANA, DESHAZO B. ([id number]) associated symptoms. Fortunately this overall appears to likely be a hopefully self-limiting condition although again if this is a brown recluse by she could just not develop the necrosis as of yet time will tell in that regard. Other Condition(s) Patient presents with Other Dermatologic Condition located on the Right Hand. General Notes: patient with suspected brown recluse bite on her right hand 3rd finger Assessment Active Problems ICD-10 Toxic effect of venom of other arthropod, accidental (unintentional), initial encounter Pain in right finger(s) Plan Follow-up Appointments: Return Appointment in 1 week. At this point my suggestion is gonna be that she continue with the clindamycin and  that we wait to see how this progresses. My hope is that there will not be any further progression and in fact this will start resolving. Nonetheless I would suggest that she continue with the evaluation of the him specialist just to ensure that they do not feel any additional workup is necessary. Nonetheless if they feel like everything was okay from their standpoint and we will see her for one more appointment next week to ensure nothing is worsening. Assuming everything is progressing nicely hopefully will be able to discharge her at that time if she has any signs of necrotic tissue or otherwise worsening will address that as needed. She's in agreement this plan. If anything changes worsens meantime shall contact the office let me know. Please see above for specific wound care orders. We will  see patient for re-evaluation in 1 week(s) here in the clinic. If anything worsens or changes patient will contact our office for additional recommendations. Electronic Signature(s) Signed: 11/17/2018 6:29:07 AM By: Lenda Kelp PA-C Entered By: Lenda Kelp on 11/16/2018 17:45:55 Feliz, Courtney Paris ([id number]) -------------------------------------------------------------------------------- ROS/PFSH Details Patient Name: Leslie Mare Date of Service: 11/16/2018 10:30 AM Medical Record Number: [id number] Patient Account Number: [id number] Date of Birth/Sex: 1971-04-06 (48 y.o. F) Treating RN: Rodell Perna Primary Care Provider: Rolm Gala Other Clinician: Referring Provider: Willy Eddy Treating Provider/Extender: Linwood Dibbles, HOYT Weeks in Treatment: 0 Information Obtained From Patient Eyes Complaints and Symptoms: Positive for: Glasses / Contacts Negative for: Dry Eyes; Vision Changes Ear/Nose/Mouth/Throat Complaints and Symptoms: Negative for: Difficult clearing ears; Sinusitis Hematologic/Lymphatic Complaints and Symptoms: Negative for: Bleeding / Clotting Disorders; Human Immunodeficiency Virus Respiratory Complaints and Symptoms: Negative for: Chronic or frequent coughs; Shortness of Breath Cardiovascular Complaints and Symptoms: Negative for: Chest pain; LE edema Gastrointestinal Complaints and Symptoms: Negative for: Frequent diarrhea; Nausea; Vomiting Endocrine Complaints and Symptoms: Negative for: Hepatitis; Thyroid disease; Polydypsia (Excessive Thirst) Genitourinary Complaints and Symptoms: Negative for: Kidney failure/ Dialysis; Incontinence/dribbling Immunological Complaints and Symptoms: Negative for: Hives; Itching Integumentary (Skin) MASIAH, LEWING B. ([id number]) Complaints and Symptoms: Negative for: Wounds; Bleeding or bruising tendency; Breakdown; Swelling Musculoskeletal Complaints and Symptoms: Negative  for: Muscle Pain; Muscle Weakness Neurologic Complaints and Symptoms: Negative for: Numbness/parasthesias; Focal/Weakness Psychiatric Complaints and Symptoms: Negative for: Anxiety; Claustrophobia Immunizations Pneumococcal Vaccine: Received Pneumococcal Vaccination: No Tetanus Vaccine: Last tetanus shot: 07/14/2018 Implantable Devices None Family and Social History Cancer: Yes - Father; Diabetes: No; Heart Disease: Yes - Father; Hypertension: No; Kidney Disease: No; Lung Disease: No; Seizures: Yes - Mother; Stroke: No; Thyroid Problems: No; Tuberculosis: No; Never smoker; Marital Status - Married; Alcohol Use: Never; Drug Use: No History; Caffeine Use: Daily - coffee; Financial Concerns: No; Food, Clothing or Shelter Needs: No; Support System Lacking: No; Transportation Concerns: No Electronic Signature(s) Signed: 11/16/2018 11:42:34 AM By: Rodell Perna Signed: 11/17/2018 6:29:07 AM By: Lenda Kelp PA-C Entered By: Rodell Perna on 11/16/2018 11:08:51 Leslie Mare ([id number]) -------------------------------------------------------------------------------- SuperBill Details Patient Name: Claudia Pollock B. Date of Service: 11/16/2018 Medical Record Number: [id number] Patient Account Number: [id number] Date of Birth/Sex: [birth year] (48 y.o. F) Treating RN: Curtis Sites Primary Care Provider: Rolm Gala Other Clinician: Referring Provider: Willy Eddy Treating Provider/Extender: Linwood Dibbles, HOYT Weeks in Treatment: 0 Diagnosis Coding ICD-10 Codes Code Description [phone] Toxic effect of venom of other arthropod, accidental (unintentional), initial encounter M79.644 Pain in right finger(s) Facility Procedures CPT4 Code: [id number] Description: 99213 - WOUND CARE VISIT-LEV 3 EST PT Modifier: Quantity: 1 Physician Procedures CPT4 Code Description: [id number] WC  PHYS LEVEL 3 o NEW PT ICD-10 Diagnosis Description T63.481A Toxic effect of venom of other  arthropod, accidental (uninten M79.644 Pain in right finger(s) Modifier: tional), initial en Quantity: 1 counter Electronic Signature(s) Signed: 11/17/2018 6:29:07 AM By: Lenda KelpStone III, Hoyt PA-C Entered By: Lenda KelpStone III, Hoyt on 11/16/2018 17:46:13

## 2018-11-20 LAB — CULTURE, BLOOD (ROUTINE X 2)
Culture: NO GROWTH
Culture: NO GROWTH
Special Requests: ADEQUATE

## 2018-11-25 ENCOUNTER — Ambulatory Visit: Payer: Self-pay | Admitting: Physician Assistant

## 2018-11-29 ENCOUNTER — Telehealth: Payer: Self-pay | Admitting: Cardiovascular Disease

## 2018-11-29 NOTE — Telephone Encounter (Signed)
Received records request EIS processing center, [employer] , forwarded to [facility] for processing. Scanned to ROI

## 2019-01-28 NOTE — Progress Notes (Signed)
VERANIA, SALBERG ([id number]) Visit Report for 11/16/2018 Allergy List Details Patient Name: ANNALAYA, WILE. Date of Service: 11/16/2018 10:30 AM Medical Record Number: [id number] Patient Account Number: [id number] Date of Birth/Sex: 1970/12/07 (48 y.o. F) Treating RN: Army Melia Primary Care Missy Baksh: Hortencia Pilar Other Clinician: Referring Kandyce Dieguez: Merlyn Lot Treating Jasmeen Fritsch/Extender: STONE III, HOYT Weeks in Treatment: 0 Allergies Active Allergies No Known Allergies Allergy Notes Electronic Signature(s) Signed: 11/16/2018 11:42:34 AM By: Army Melia Entered By: Army Melia on 11/16/2018 11:07:05 [name] ([id number]) -------------------------------------------------------------------------------- Arrival Information Details Patient Name: [name] Date of Service: 11/16/2018 10:30 AM Medical Record Number: [id number] Patient Account Number: [id number] Date of Birth/Sex: June 02, 1971 (48 y.o. F) Treating RN: Montey Hora Primary Care Halley Kincer: Hortencia Pilar Other Clinician: Referring Roshana Shuffield: Merlyn Lot Treating Deray Dawes/Extender: Melburn Hake, HOYT Weeks in Treatment: 0 Visit Information Patient Arrived: Ambulatory Arrival Time: 10:52 Accompanied By: self Transfer Assistance: None Patient Identification Verified: Yes Secondary Verification Process Completed: Yes Electronic Signature(s) Signed: 12/06/2018 3:07:42 PM By: Lorine Bears RCP, RRT, CHT Entered By: Lorine Bears on 11/16/2018 10:53:32 Crabtree, Dani Gobble ([id number]) -------------------------------------------------------------------------------- Clinic Level of Care Assessment Details Patient Name: JAELEE, LAUGHTER B. Date of Service: 11/16/2018 10:30 AM Medical Record Number: [id number] Patient Account Number: [id number] Date of Birth/Sex: [birth year] (48 y.o. F) Treating RN: Montey Hora Primary Care  Marquis Diles: Hortencia Pilar Other Clinician: Referring Meloni Hinz: Merlyn Lot Treating Jayleena Stille/Extender: Melburn Hake, HOYT Weeks in Treatment: 0 Clinic Level of Care Assessment Items TOOL 2 Quantity Score []  - Use when only an EandM is performed on the INITIAL visit 0 ASSESSMENTS - Nursing Assessment / Reassessment X - General Physical Exam (combine w/ comprehensive assessment (listed just below) when 1 20 performed on new pt. evals) X- 1 25 Comprehensive Assessment (HX, ROS, Risk Assessments, Wounds Hx, etc.) ASSESSMENTS - Wound and Skin Assessment / Reassessment []  - Simple Wound Assessment / Reassessment - one wound 0 []  - 0 Complex Wound Assessment / Reassessment - multiple wounds X- 1 10 Dermatologic / Skin Assessment (not related to wound area) ASSESSMENTS - Ostomy and/or Continence Assessment and Care []  - Incontinence Assessment and Management 0 []  - 0 Ostomy Care Assessment and Management (repouching, etc.) PROCESS - Coordination of Care X - Simple Patient / Family Education for ongoing care 1 15 []  - 0 Complex (extensive) Patient / Family Education for ongoing care X- 1 10 Staff obtains Programmer, systems, Records, Test Results / Process Orders []  - 0 Staff telephones HHA, Nursing Homes / Clarify orders / etc []  - 0 Routine Transfer to another Facility (non-emergent condition) []  - 0 Routine Hospital Admission (non-emergent condition) X- 1 15 New Admissions / Biomedical engineer / Ordering NPWT, Apligraf, etc. []  - 0 Emergency Hospital Admission (emergent condition) X- 1 10 Simple Discharge Coordination []  - 0 Complex (extensive) Discharge Coordination PROCESS - Special Needs []  - Pediatric / Minor Patient Management 0 []  - 0 Isolation Patient Management SAKINAH, ROSAMOND. ([id number]) []  - 0 Hearing / Language / Visual special needs []  - 0 Assessment of Community assistance (transportation, D/C planning, etc.) []  - 0 Additional assistance / Altered  mentation []  - 0 Support Surface(s) Assessment (bed, cushion, seat, etc.) INTERVENTIONS - Wound Cleansing / Measurement X - Wound Imaging (photographs - any number of wounds) 1 5 []  - 0 Wound Tracing (instead of photographs) []  - 0 Simple Wound Measurement - one wound []  - 0 Complex Wound Measurement - multiple wounds []  - 0 Simple Wound Cleansing - one wound []  -  0 Complex Wound Cleansing - multiple wounds INTERVENTIONS - Wound Dressings []  - Small Wound Dressing one or multiple wounds 0 []  - 0 Medium Wound Dressing one or multiple wounds []  - 0 Large Wound Dressing one or multiple wounds []  - 0 Application of Medications - injection INTERVENTIONS - Miscellaneous []  - External ear exam 0 []  - 0 Specimen Collection (cultures, biopsies, blood, body fluids, etc.) []  - 0 Specimen(s) / Culture(s) sent or taken to Lab for analysis []  - 0 Patient Transfer (multiple staff / Michiel Sites Lift / Similar devices) []  - 0 Simple Staple / Suture removal (25 or less) []  - 0 Complex Staple / Suture removal (26 or more) []  - 0 Hypo / Hyperglycemic Management (close monitor of Blood Glucose) []  - 0 Ankle / Brachial Index (ABI) - do not check if billed separately Has the patient been seen at the hospital within the last three years: Yes Total Score: 110 Level Of Care: New/Established - Level 3 Electronic Signature(s) Signed: 11/16/2018 4:26:15 PM By: Curtis Sites Entered By: Curtis Sites on 11/16/2018 11:35:36 Fredric Mare ([id number]) -------------------------------------------------------------------------------- Encounter Discharge Information Details Patient Name: Claudia Pollock B. Date of Service: 11/16/2018 10:30 AM Medical Record Number: [id number] Patient Account Number: [id number] Date of Birth/Sex: 11-22-1970 (48 y.o. F) Treating RN: Curtis Sites Primary Care Mackenzie Groom: Rolm Gala Other Clinician: Referring Delonda Coley: Willy Eddy Treating  Brysen Shankman/Extender: Linwood Dibbles, HOYT Weeks in Treatment: 0 Encounter Discharge Information Items Discharge Condition: Stable Ambulatory Status: Ambulatory Discharge Destination: Home Transportation: Private Auto Accompanied By: self Schedule Follow-up Appointment: Yes Clinical Summary of Care: Electronic Signature(s) Signed: 11/16/2018 11:36:34 AM By: Curtis Sites Entered By: Curtis Sites on 11/16/2018 11:36:34 Krohn, Courtney Paris ([id number]) -------------------------------------------------------------------------------- Lower Extremity Assessment Details Patient Name: Claudia Pollock B. Date of Service: 11/16/2018 10:30 AM Medical Record Number: [id number] Patient Account Number: [id number] Date of Birth/Sex: 29-Apr-1971 (48 y.o. F) Treating RN: Rodell Perna Primary Care Dian Laprade: Rolm Gala Other Clinician: Referring Dean Wonder: Willy Eddy Treating Thimothy Barretta/Extender: Linwood Dibbles, HOYT Weeks in Treatment: 0 Electronic Signature(s) Signed: 11/16/2018 11:42:34 AM By: Rodell Perna Entered By: Rodell Perna on 11/16/2018 11:06:48 Fredric Mare ([id number]) -------------------------------------------------------------------------------- Multi Wound Chart Details Patient Name: Claudia Pollock B. Date of Service: 11/16/2018 10:30 AM Medical Record Number: [id number] Patient Account Number: [id number] Date of Birth/Sex: 26-Oct-1970 (48 y.o. F) Treating RN: Curtis Sites Primary Care Izzabella Besse: Rolm Gala Other Clinician: Referring Burnis Kaser: Willy Eddy Treating Siyon Linck/Extender: Linwood Dibbles, HOYT Weeks in Treatment: 0 Vital Signs Height(in): 62 Pulse(bpm): 97 Weight(lbs): 128.5 Blood Pressure(mmHg): 125/62 Body Mass Index(BMI): 24 Temperature(F): 98.2 Respiratory Rate 16 (breaths/min): Photos: [1:No Photos] [N/A:N/A] Wound Location: [1:Right Hand - 2nd Digit - Anterior] [N/A:N/A] Wounding Event: [1:Bite] [N/A:N/A] Primary Etiology:  [1:Trauma, Other] [N/A:N/A] Date Acquired: [1:11/14/2018] [N/A:N/A] Weeks of Treatment: [1:0] [N/A:N/A] Wound Status: [1:Open] [N/A:N/A] Measurements L x W x D [1:0.1x0.1x0.1] [N/A:N/A] (cm) Area (cm) : [1:0.008] [N/A:N/A] Volume (cm) : [1:0.001] [N/A:N/A] Classification: [1:Unclassifiable] [N/A:N/A] Exudate Amount: [1:None Present] [N/A:N/A] Wound Margin: [1:Flat and Intact] [N/A:N/A] Granulation Amount: [1:None Present (0%)] [N/A:N/A] Necrotic Amount: [1:None Present (0%)] [N/A:N/A] Exposed Structures: [1:Fascia: No Fat Layer (Subcutaneous Tissue) Exposed: No Tendon: No Muscle: No Joint: No Bone: No Limited to Skin Breakdown] [N/A:N/A] Treatment Notes Electronic Signature(s) Signed: 11/16/2018 4:26:15 PM By: Curtis Sites Entered By: Curtis Sites on 11/16/2018 11:19:38 Fredric Mare ([id number]) -------------------------------------------------------------------------------- Multi-Disciplinary Care Plan Details Patient Name: Claudia Pollock B. Date of Service: 11/16/2018 10:30 AM Medical Record Number: [id number] Patient Account Number: [id number] Date of Birth/Sex: Jul 19, 1970 (48 y.o. F)  Treating RN: Curtis Sitesorthy, Joanna Primary Care Cloria Ciresi: Rolm GalaGrandis, Heidi Other Clinician: Referring Birdia Jaycox: Willy EddyOBINSON, PATRICK Treating Kaikoa Magro/Extender: Linwood DibblesSTONE III, HOYT Weeks in Treatment: 0 Active Inactive Electronic Signature(s) Signed: 12/02/2018 10:08:55 AM By: Elliot GurneyWoody, BSN, RN, CWS, Kim RN, BSN Signed: 01/28/2019 1:03:09 PM By: Curtis Sitesorthy, Joanna Previous Signature: 11/16/2018 4:26:15 PM Version By: Curtis Sitesorthy, Joanna Entered By: Elliot GurneyWoody, BSN, RN, CWS, Kim on 12/02/2018 10:08:55 Fredric MareICHARDSON, Montina B. ([id number]) -------------------------------------------------------------------------------- Non-Wound Condition Assessment Details Patient Name: [name], Britt B. Date of Service: 11/16/2018 10:30 AM Medical Record Number: [id number] Patient Account Number: [id number] Date of  Birth/Sex: [birth year] (48 y.o. F) Treating RN: Curtis Sitesorthy, Joanna Primary Care Alek Borges: Rolm GalaGrandis, Heidi Other Clinician: Referring Aleric Froelich: Willy EddyOBINSON, PATRICK Treating Keevan Wolz/Extender: Linwood DibblesSTONE III, HOYT Weeks in Treatment: 0 Non-Wound Condition: Condition: Other Dermatologic Condition Location: Hand Side: Right Notes patient with suspected brown recluse bite on her right hand 3rd finger Electronic Signature(s) Signed: 11/16/2018 4:26:15 PM By: Curtis Sitesorthy, Joanna Entered By: Curtis Sitesorthy, Joanna on 11/16/2018 11:22:12 Fredric MareICHARDSON, Karrina B. ([id number]) -------------------------------------------------------------------------------- Pain Assessment Details Patient Name: Claudia PollockICHARDSON, Thandiwe B. Date of Service: 11/16/2018 10:30 AM Medical Record Number: [id number] Patient Account Number: [id number] Date of Birth/Sex: [birth year] (48 y.o. F) Treating RN: Curtis Sitesorthy, Joanna Primary Care Jayani Rozman: Rolm GalaGrandis, Heidi Other Clinician: Referring Domenique Southers: Willy EddyOBINSON, PATRICK Treating Vernal Rutan/Extender: Linwood DibblesSTONE III, HOYT Weeks in Treatment: 0 Active Problems Location of Pain Severity and Description of Pain Patient Has Paino Yes Site Locations Rate the pain. Current Pain Level: 2 Pain Management and Medication Current Pain Management: Electronic Signature(s) Signed: 11/16/2018 4:26:15 PM By: Curtis Sitesorthy, Joanna Signed: 12/06/2018 3:07:42 PM By: Dayton MartesWallace, RCP,RRT,CHT, [name] RCP, RRT, CHT Entered By: Dayton MartesWallace, RCP,RRT,CHT, [name] on 11/16/2018 10:54:05 Fredric MareICHARDSON, Alysiana B. ([id number]) -------------------------------------------------------------------------------- Patient/Caregiver Education Details Patient Name: Fredric MareICHARDSON, Sayler B. Date of Service: 11/16/2018 10:30 AM Medical Record Number: [id number] Patient Account Number: [id number] Date of Birth/Gender: [birth year] (48 y.o. F) Treating RN: Curtis Sitesorthy, Joanna Primary Care Physician: Rolm GalaGrandis, Heidi Other Clinician: Referring Physician: Willy EddyOBINSON, PATRICK Treating  Physician/Extender: Skeet SimmerSTONE III, HOYT Weeks in Treatment: 0 Education Assessment Education Provided To: Patient Education Topics Provided Infection: Handouts: Other: managing finger insect bite Methods: Explain/Verbal Responses: State content correctly Electronic Signature(s) Signed: 11/16/2018 4:26:15 PM By: Curtis Sitesorthy, Joanna Entered By: Curtis Sitesorthy, Joanna on 11/16/2018 11:36:14 Quadros, Courtney ParisELIZABETH B. ([id number]) -------------------------------------------------------------------------------- Vitals Details Patient Name: Claudia PollockICHARDSON, Maycel B. Date of Service: 11/16/2018 10:30 AM Medical Record Number: [id number] Patient Account Number: [id number] Date of Birth/Sex: [birth year] (48 y.o. F) Treating RN: Curtis Sitesorthy, Joanna Primary Care Selita Staiger: Rolm GalaGrandis, Heidi Other Clinician: Referring Flois Mctague: Willy EddyOBINSON, PATRICK Treating Adalei Novell/Extender: Linwood DibblesSTONE III, HOYT Weeks in Treatment: 0 Vital Signs Time Taken: 10:54 Temperature (F): 98.2 Height (in): 62 Pulse (bpm): 97 Source: Stated Respiratory Rate (breaths/min): 16 Weight (lbs): 128.5 Blood Pressure (mmHg): 125/62 Source: Measured Reference Range: 80 - 120 mg / dl Body Mass Index (BMI): 23.5 Electronic Signature(s) Signed: 12/06/2018 3:07:42 PM By: Dayton MartesWallace, RCP,RRT,CHT, [name] RCP, RRT, CHT Entered By: Dayton MartesWallace, RCP,RRT,CHT, [name] on 11/16/2018 10:57:18

## 2019-03-09 ENCOUNTER — Ambulatory Visit
Admission: RE | Admit: 2019-03-09 | Discharge: 2019-03-09 | Disposition: A | Discharge status: Home / Self Care | Payer: Self-pay | Source: Ambulatory Visit | Attending: Pediatrics | Admitting: Pediatrics

## 2019-03-09 ENCOUNTER — Other Ambulatory Visit: Payer: Self-pay | Admitting: Pediatrics

## 2019-03-09 DIAGNOSIS — R52 Pain, unspecified: Secondary | ICD-10-CM | POA: Insufficient documentation

## 2021-04-17 ENCOUNTER — Other Ambulatory Visit: Payer: Self-pay | Admitting: Family Medicine

## 2021-04-17 DIAGNOSIS — Z1231 Encounter for screening mammogram for malignant neoplasm of breast: Secondary | ICD-10-CM

## 2021-04-28 LAB — COLOGUARD: COLOGUARD: NEGATIVE

## 2021-07-02 ENCOUNTER — Ambulatory Visit
Admission: RE | Admit: 2021-07-02 | Discharge: 2021-07-02 | Disposition: A | Discharge status: Home / Self Care | Payer: 59 | Source: Ambulatory Visit | Attending: Family Medicine | Admitting: Family Medicine

## 2021-07-02 ENCOUNTER — Other Ambulatory Visit: Payer: Self-pay

## 2021-07-02 DIAGNOSIS — Z1231 Encounter for screening mammogram for malignant neoplasm of breast: Secondary | ICD-10-CM | POA: Insufficient documentation

## 2021-09-27 ENCOUNTER — Other Ambulatory Visit: Payer: Self-pay

## 2021-09-27 MED ORDER — CYCLOBENZAPRINE HCL 5 MG PO TABS
ORAL_TABLET | ORAL | 0 refills | Status: DC
  Filled 2021-09-27: qty 30, 10d supply, fill #0

## 2022-07-01 ENCOUNTER — Other Ambulatory Visit: Payer: Self-pay

## 2022-07-01 DIAGNOSIS — G43109 Migraine with aura, not intractable, without status migrainosus: Secondary | ICD-10-CM | POA: Diagnosis not present

## 2022-07-01 DIAGNOSIS — Z0000 Encounter for general adult medical examination without abnormal findings: Secondary | ICD-10-CM | POA: Diagnosis not present

## 2022-07-01 DIAGNOSIS — Z780 Asymptomatic menopausal state: Secondary | ICD-10-CM | POA: Diagnosis not present

## 2022-07-01 DIAGNOSIS — Z1231 Encounter for screening mammogram for malignant neoplasm of breast: Secondary | ICD-10-CM | POA: Diagnosis not present

## 2022-07-01 DIAGNOSIS — I341 Nonrheumatic mitral (valve) prolapse: Secondary | ICD-10-CM | POA: Diagnosis not present

## 2022-07-01 MED ORDER — ONDANSETRON 8 MG PO TBDP
8.0000 mg | ORAL_TABLET | Freq: Three times a day (TID) | ORAL | 5 refills | Status: DC | PRN
  Filled 2022-07-01: qty 20, 7d supply, fill #0
  Filled 2023-06-30: qty 20, 7d supply, fill #1

## 2022-07-01 MED ORDER — RIZATRIPTAN BENZOATE 10 MG PO TABS
10.0000 mg | ORAL_TABLET | Freq: Once | ORAL | 1 refills | Status: DC | PRN
  Filled 2022-07-01: qty 18, 30d supply, fill #0
  Filled 2022-10-23: qty 18, 30d supply, fill #1
  Filled 2023-05-21: qty 18, 30d supply, fill #2
  Filled 2023-06-30: qty 6, 10d supply, fill #3

## 2022-07-02 ENCOUNTER — Other Ambulatory Visit: Payer: Self-pay | Admitting: Internal Medicine

## 2022-07-02 DIAGNOSIS — Z1231 Encounter for screening mammogram for malignant neoplasm of breast: Secondary | ICD-10-CM

## 2022-08-07 ENCOUNTER — Ambulatory Visit
Admission: RE | Admit: 2022-08-07 | Discharge: 2022-08-07 | Disposition: A | Discharge status: Home / Self Care | Payer: 59 | Source: Ambulatory Visit | Attending: Internal Medicine | Admitting: Internal Medicine

## 2022-08-07 DIAGNOSIS — Z1231 Encounter for screening mammogram for malignant neoplasm of breast: Secondary | ICD-10-CM | POA: Diagnosis not present

## 2022-08-12 ENCOUNTER — Other Ambulatory Visit: Payer: Self-pay | Admitting: Internal Medicine

## 2022-08-12 DIAGNOSIS — R928 Other abnormal and inconclusive findings on diagnostic imaging of breast: Secondary | ICD-10-CM

## 2022-08-12 DIAGNOSIS — N63 Unspecified lump in unspecified breast: Secondary | ICD-10-CM

## 2022-08-13 ENCOUNTER — Ambulatory Visit
Admission: RE | Admit: 2022-08-13 | Discharge: 2022-08-13 | Disposition: A | Discharge status: Home / Self Care | Payer: 59 | Source: Ambulatory Visit | Attending: Internal Medicine | Admitting: Internal Medicine

## 2022-08-13 DIAGNOSIS — R928 Other abnormal and inconclusive findings on diagnostic imaging of breast: Secondary | ICD-10-CM | POA: Diagnosis not present

## 2022-08-13 DIAGNOSIS — N63 Unspecified lump in unspecified breast: Secondary | ICD-10-CM

## 2022-08-13 DIAGNOSIS — N6323 Unspecified lump in the left breast, lower outer quadrant: Secondary | ICD-10-CM | POA: Diagnosis not present

## 2022-09-09 ENCOUNTER — Other Ambulatory Visit: Payer: Self-pay

## 2022-09-09 ENCOUNTER — Telehealth: Payer: 59 | Admitting: Physician Assistant

## 2022-09-09 DIAGNOSIS — B9689 Other specified bacterial agents as the cause of diseases classified elsewhere: Secondary | ICD-10-CM

## 2022-09-09 DIAGNOSIS — J208 Acute bronchitis due to other specified organisms: Secondary | ICD-10-CM

## 2022-09-09 MED ORDER — PROMETHAZINE-DM 6.25-15 MG/5ML PO SYRP
5.0000 mL | ORAL_SOLUTION | Freq: Four times a day (QID) | ORAL | 0 refills | Status: DC | PRN
  Filled 2022-09-09: qty 118, 6d supply, fill #0

## 2022-09-09 MED ORDER — BENZONATATE 100 MG PO CAPS
100.0000 mg | ORAL_CAPSULE | Freq: Three times a day (TID) | ORAL | 0 refills | Status: DC | PRN
  Filled 2022-09-09: qty 30, 10d supply, fill #0

## 2022-09-09 MED ORDER — AZITHROMYCIN 250 MG PO TABS
ORAL_TABLET | ORAL | 0 refills | Status: AC
  Filled 2022-09-09: qty 6, 5d supply, fill #0

## 2022-09-09 MED ORDER — PREDNISONE 20 MG PO TABS
40.0000 mg | ORAL_TABLET | Freq: Every day | ORAL | 0 refills | Status: DC
  Filled 2022-09-09: qty 6, 3d supply, fill #0
  Filled 2022-09-09: qty 4, 2d supply, fill #0

## 2022-09-09 NOTE — Patient Instructions (Signed)
[name], thank you for joining [name], PA-C for today's virtual visit.  While this provider is not your primary care provider (PCP), if your PCP is located in our provider database this encounter information will be shared with them immediately following your visit.   A [facility] MyChart account gives you access to today's visit and all your visits, tests, and labs performed at [facility] " click here if you don't have a [facility] MyChart account or go to [email or web address]  Consent: (Patient) [name] provided verbal consent for this virtual visit at the beginning of the encounter.  Current Medications:  Current Outpatient Medications:    azithromycin (ZITHROMAX) 250 MG tablet, Take 2 tablets on day 1, then 1 tablet daily on days 2 through 5, Disp: 6 tablet, Rfl: 0   benzonatate (TESSALON) 100 MG capsule, Take 1 capsule (100 mg total) by mouth 3 (three) times daily as needed., Disp: 30 capsule, Rfl: 0   predniSONE (DELTASONE) 20 MG tablet, Take 2 tablets (40 mg total) by mouth daily with breakfast., Disp: 10 tablet, Rfl: 0   promethazine-dextromethorphan (PROMETHAZINE-DM) 6.25-15 MG/5ML syrup, Take 5 mLs by mouth 4 (four) times daily as needed., Disp: 118 mL, Rfl: 0   acetaminophen (TYLENOL) 325 MG tablet, Take 650 mg by mouth every 6 (six) hours as needed for mild pain or headache., Disp: , Rfl:    cyclobenzaprine (FLEXERIL) 5 MG tablet, Take 1 tablet (5 mg total) by mouth 3 (three) times daily as needed for Muscle spasms for up to 10 days, Disp: 30 tablet, Rfl: 0   ibuprofen (ADVIL,MOTRIN) 200 MG tablet, Take 400 mg by mouth every 6 (six) hours as needed for headache or mild pain., Disp: , Rfl:    Multiple Vitamins tablet, Take 1 tablet by mouth daily. , Disp: , Rfl:    ondansetron (ZOFRAN-ODT) 8 MG disintegrating tablet, Take 8 mg by mouth every 8 (eight) hours as needed for nausea or vomiting. , Disp: , Rfl:    ondansetron  (ZOFRAN-ODT) 8 MG disintegrating tablet, Take 1 tablet (8 mg total) by mouth every 8 (eight) hours as needed for nausea., Disp: 20 tablet, Rfl: 5   rizatriptan (MAXALT) 10 MG tablet, Take 10 mg by mouth as needed for migraine. May repeat in 2 hours if needed, Disp: , Rfl:    rizatriptan (MAXALT) 10 MG tablet, Take 1 tablet (10 mg total) by mouth once as needed for Migraine for up to 1 dose. May take a second dose after 2 hours if needed., Disp: 30 tablet, Rfl: 1   Medications ordered in this encounter:  Meds ordered this encounter  Medications   azithromycin (ZITHROMAX) 250 MG tablet    Sig: Take 2 tablets on day 1, then 1 tablet daily on days 2 through 5    Dispense:  6 tablet    Refill:  0    Order Specific Question:   Supervising Provider    Answer:   Merrilee Jansky [[id number]]   promethazine-dextromethorphan (PROMETHAZINE-DM) 6.25-15 MG/5ML syrup    Sig: Take 5 mLs by mouth 4 (four) times daily as needed.    Dispense:  118 mL    Refill:  0    Order Specific Question:   Supervising Provider    Answer:   Merrilee Jansky [[id number]]   benzonatate (TESSALON) 100 MG capsule    Sig: Take 1 capsule (100 mg total) by mouth 3 (three) times daily as needed.    Dispense:  30 capsule    Refill:  0    Order Specific Question:   Supervising Provider    Answer:   Merrilee Jansky [[id number]]   predniSONE (DELTASONE) 20 MG tablet    Sig: Take 2 tablets (40 mg total) by mouth daily with breakfast.    Dispense:  10 tablet    Refill:  0    Order Specific Question:   Supervising Provider    Answer:   Merrilee Jansky [[id number]]     *If you need refills on other medications prior to your next appointment, please contact your pharmacy*  Follow-Up: Call back or seek an in-person evaluation if the symptoms worsen or if the condition fails to improve as anticipated.  [facility] [phone]  Other Instructions  Acute Bronchitis, Adult  Acute bronchitis is sudden inflammation of  the main airways (bronchi) that come off the windpipe (trachea) in the lungs. The swelling causes the airways to get smaller and make more mucus than normal. This can make it hard to breathe and can cause coughing or noisy breathing (wheezing). Acute bronchitis may last several weeks. The cough may last longer. Allergies, asthma, and exposure to smoke may make the condition worse. What are the causes? This condition can be caused by germs and by substances that irritate the lungs, including: Cold and flu viruses. The most common cause of this condition is the virus that causes the common cold. Bacteria. This is less common. Breathing in substances that irritate the lungs, including: Smoke from cigarettes and other forms of tobacco. Dust and pollen. Fumes from household cleaning products, gases, or burned fuel. Indoor or outdoor air pollution. What increases the risk? The following factors may make you more likely to develop this condition: A weak body's defense system, also called the immune system. A condition that affects your lungs and breathing, such as asthma. What are the signs or symptoms? Common symptoms of this condition include: Coughing. This may bring up clear, yellow, or green mucus from your lungs (sputum). Wheezing. Runny or stuffy nose. Having too much mucus in your lungs (chest congestion). Shortness of breath. Aches and pains, including sore throat or chest. How is this diagnosed? This condition is usually diagnosed based on: Your symptoms and medical history. A physical exam. You may also have other tests, including tests to rule out other conditions, such as pneumonia. These tests include: A test of lung function. Test of a mucus sample to look for the presence of bacteria. Tests to check the oxygen level in your blood. Blood tests. Chest X-ray. How is this treated? Most cases of acute bronchitis clear up over time without treatment. Your health care provider may  recommend: Drinking more fluids to help thin your mucus so it is easier to cough up. Taking inhaled medicine (inhaler) to improve air flow in and out of your lungs. Using a vaporizer or a humidifier. These are machines that add water to the air to help you breathe better. Taking a medicine that thins mucus and clears congestion (expectorant). Taking a medicine that prevents or stops coughing (cough suppressant). It is not common to take an antibiotic medicine for this condition. Follow these instructions at home:  Take over-the-counter and prescription medicines only as told by your health care provider. Use an inhaler, vaporizer, or humidifier as told by your health care provider. Take two teaspoons (10 mL) of honey at bedtime to lessen coughing at night. Drink enough fluid to keep your urine pale  yellow. Do not use any products that contain nicotine or tobacco. These products include cigarettes, chewing tobacco, and vaping devices, such as e-cigarettes. If you need help quitting, ask your health care provider. Get plenty of rest. Return to your normal activities as told by your health care provider. Ask your health care provider what activities are safe for you. Keep all follow-up visits. This is important. How is this prevented? To lower your risk of getting this condition again: Wash your hands often with soap and water for at least 20 seconds. If soap and water are not available, use hand sanitizer. Avoid contact with people who have cold symptoms. Try not to touch your mouth, nose, or eyes with your hands. Avoid breathing in smoke or chemical fumes. Breathing smoke or chemical fumes will make your condition worse. Get the flu shot every year. Contact a health care provider if: Your symptoms do not improve after 2 weeks. You have trouble coughing up the mucus. Your cough keeps you awake at night. You have a fever. Get help right away if you: Cough up blood. Feel pain in your  chest. Have severe shortness of breath. Faint or keep feeling like you are going to faint. Have a severe headache. Have a fever or chills that get worse. These symptoms may represent a serious problem that is an emergency. Do not wait to see if the symptoms will go away. Get medical help right away. Call your local emergency services (911 in the [place] Do not drive yourself to the hospital. Summary Acute bronchitis is inflammation of the main airways (bronchi) that come off the windpipe (trachea) in the lungs. The swelling causes the airways to get smaller and make more mucus than normal. Drinking more fluids can help thin your mucus so it is easier to cough up. Take over-the-counter and prescription medicines only as told by your health care provider. Do not use any products that contain nicotine or tobacco. These products include cigarettes, chewing tobacco, and vaping devices, such as e-cigarettes. If you need help quitting, ask your health care provider. Contact a health care provider if your symptoms do not improve after 2 weeks. This information is not intended to replace advice given to you by your health care provider. Make sure you discuss any questions you have with your health care provider. Document Revised: 08/29/2021 Document Reviewed: 09/19/2020 Elsevier Patient Education  2023 [employer]    If you have been instructed to have an in-person evaluation today at a local Urgent Care facility, please use the link below. It will take you to a list of all of our available [facility], including address, phone number and hours of operation. Please do not delay care.  [facility]  If you or a family member do not have a primary care provider, use the link below to schedule a visit and establish care. When you choose a [facility] primary care physician or advanced practice provider, you gain a long-term partner in health. Find a Primary Care Provider  Learn  more about [facility]'s in-office and virtual care options: [facility] - Get Care Now

## 2022-09-09 NOTE — Progress Notes (Signed)
Virtual Visit Consent   [name], you are scheduled for a virtual visit with a [facility] provider today. Just as with appointments in the office, your consent must be obtained to participate. Your consent will be active for this visit and any virtual visit you may have with one of our providers in the next 365 days. If you have a MyChart account, a copy of this consent can be sent to you electronically.  As this is a virtual visit, video technology does not allow for your provider to perform a traditional examination. This may limit your provider's ability to fully assess your condition. If your provider identifies any concerns that need to be evaluated in person or the need to arrange testing (such as labs, EKG, etc.), we will make arrangements to do so. Although advances in technology are sophisticated, we cannot ensure that it will always work on either your end or our end. If the connection with a video visit is poor, the visit may have to be switched to a telephone visit. With either a video or telephone visit, we are not always able to ensure that we have a secure connection.  By engaging in this virtual visit, you consent to the provision of healthcare and authorize for your insurance to be billed (if applicable) for the services provided during this visit. Depending on your insurance coverage, you may receive a charge related to this service.  I need to obtain your verbal consent now. Are you willing to proceed with your visit today? [name] has provided verbal consent on 09/09/2022 for a virtual visit (video or telephone). Leslie Loveless, PA-C  Date: 09/09/2022 1:28 PM  Virtual Visit via Video Note   I, [name], connected with  [name]  ([id number], Oct 24, 1970) on 09/09/22 at  1:30 PM EDT by a video-enabled telemedicine application and verified that I am speaking with the correct person using two identifiers.  Location: Patient:  Virtual Visit Location Patient: Home Provider: Virtual Visit Location Provider: Home Office   I discussed the limitations of evaluation and management by telemedicine and the availability of in person appointments. The patient expressed understanding and agreed to proceed.    History of Present Illness: [name] is a 52 y.o. who identifies as a female who was assigned female at birth, and is being seen today for cough and congestion.  HPI: Cough This is a new problem. The current episode started 1 to 4 weeks ago (over 2 weeks). The problem has been gradually worsening. The problem occurs every few minutes. The cough is Productive of sputum and productive of purulent sputum. Associated symptoms include chills, a fever (for 2 days), myalgias, nasal congestion, postnasal drip and rhinorrhea. Pertinent negatives include no ear congestion, ear pain, headaches, sore throat, shortness of breath or wheezing. Associated symptoms comments: malaise. The symptoms are aggravated by lying down. Treatments tried: mucinex, dayquil, nyquil, robitussin. The treatment provided no relief. There is no history of asthma, bronchitis or pneumonia.   Negative for Covid 19 and flu ([employer] Employee)   Problems:  Patient Active Problem List   Diagnosis Date Noted   Tachycardia 04/07/2016   Abdominal pain, epigastric    Nausea    Chest pressure 08/29/2014   Murmur 08/29/2014   Other migraine without status migrainosus, not intractable 08/29/2014    Allergies: No Known Allergies Medications:  Current Outpatient Medications:    azithromycin (ZITHROMAX) 250 MG tablet, Take 2 tablets (500 mg total) by  mouth daily for 1 day, THEN 1 tablet (250 mg total) daily for 4 days., Disp: 6 tablet, Rfl: 0   benzonatate (TESSALON) 100 MG capsule, Take 1 capsule (100 mg total) by mouth 3 (three) times daily as needed., Disp: 30 capsule, Rfl: 0   predniSONE (DELTASONE) 20 MG tablet, Take 2 tablets (40 mg total) by mouth  daily with breakfast., Disp: 10 tablet, Rfl: 0   promethazine-dextromethorphan (PROMETHAZINE-DM) 6.25-15 MG/5ML syrup, Take 5 mLs by mouth 4 (four) times daily as needed., Disp: 118 mL, Rfl: 0   acetaminophen (TYLENOL) 325 MG tablet, Take 650 mg by mouth every 6 (six) hours as needed for mild pain or headache., Disp: , Rfl:    cyclobenzaprine (FLEXERIL) 5 MG tablet, Take 1 tablet (5 mg total) by mouth 3 (three) times daily as needed for Muscle spasms for up to 10 days, Disp: 30 tablet, Rfl: 0   ibuprofen (ADVIL,MOTRIN) 200 MG tablet, Take 400 mg by mouth every 6 (six) hours as needed for headache or mild pain., Disp: , Rfl:    Multiple Vitamins tablet, Take 1 tablet by mouth daily. , Disp: , Rfl:    ondansetron (ZOFRAN-ODT) 8 MG disintegrating tablet, Take 8 mg by mouth every 8 (eight) hours as needed for nausea or vomiting. , Disp: , Rfl:    ondansetron (ZOFRAN-ODT) 8 MG disintegrating tablet, Take 1 tablet (8 mg total) by mouth every 8 (eight) hours as needed for nausea., Disp: 20 tablet, Rfl: 5   rizatriptan (MAXALT) 10 MG tablet, Take 10 mg by mouth as needed for migraine. May repeat in 2 hours if needed, Disp: , Rfl:    rizatriptan (MAXALT) 10 MG tablet, Take 1 tablet (10 mg total) by mouth once as needed for Migraine for up to 1 dose. May take a second dose after 2 hours if needed., Disp: 30 tablet, Rfl: 1  Observations/Objective: Patient is well-developed, well-nourished in no acute distress.  Resting comfortably at home.  Head is normocephalic, atraumatic.  No labored breathing.  Speech is clear and coherent with logical content.  Patient is alert and oriented at baseline.    Assessment and Plan: 1. Acute bacterial bronchitis - azithromycin (ZITHROMAX) 250 MG tablet; Take 2 tablets (500 mg total) by mouth daily for 1 day, THEN 1 tablet (250 mg total) daily for 4 days.  Dispense: 6 tablet; Refill: 0 - promethazine-dextromethorphan (PROMETHAZINE-DM) 6.25-15 MG/5ML syrup; Take 5 mLs by  mouth 4 (four) times daily as needed.  Dispense: 118 mL; Refill: 0 - benzonatate (TESSALON) 100 MG capsule; Take 1 capsule (100 mg total) by mouth 3 (three) times daily as needed.  Dispense: 30 capsule; Refill: 0 - predniSONE (DELTASONE) 20 MG tablet; Take 2 tablets (40 mg total) by mouth daily with breakfast.  Dispense: 10 tablet; Refill: 0  - Worsening over a week despite OTC medications - Will treat with Z-pack, Prednisone, Promethazine DM and tessalon perles - Can continue Mucinex  - Push fluids.  - Rest.  - Steam and humidifier can help - Seek in person evaluation if worsening or symptoms fail to improve    Follow Up Instructions: I discussed the assessment and treatment plan with the patient. The patient was provided an opportunity to ask questions and all were answered. The patient agreed with the plan and demonstrated an understanding of the instructions.  A copy of instructions were sent to the patient via MyChart unless otherwise noted below.    The patient was advised to call back or seek an  in-person evaluation if the symptoms worsen or if the condition fails to improve as anticipated.  Time:  I spent 10 minutes with the patient via telehealth technology discussing the above problems/concerns.    Mar Daring, PA-C

## 2022-10-23 ENCOUNTER — Other Ambulatory Visit: Payer: Self-pay

## 2022-11-28 ENCOUNTER — Other Ambulatory Visit: Payer: Self-pay

## 2022-11-28 ENCOUNTER — Telehealth: Payer: 59 | Admitting: Physician Assistant

## 2022-11-28 DIAGNOSIS — B9689 Other specified bacterial agents as the cause of diseases classified elsewhere: Secondary | ICD-10-CM

## 2022-11-28 DIAGNOSIS — J019 Acute sinusitis, unspecified: Secondary | ICD-10-CM

## 2022-11-28 MED ORDER — BENZONATATE 100 MG PO CAPS
100.0000 mg | ORAL_CAPSULE | Freq: Three times a day (TID) | ORAL | 0 refills | Status: DC | PRN
  Filled 2022-11-28: qty 30, 10d supply, fill #0

## 2022-11-28 MED ORDER — FLUTICASONE PROPIONATE 50 MCG/ACT NA SUSP
2.0000 | Freq: Every day | NASAL | 0 refills | Status: DC
  Filled 2022-11-28: qty 16, 30d supply, fill #0

## 2022-11-28 MED ORDER — AMOXICILLIN-POT CLAVULANATE 875-125 MG PO TABS
1.0000 | ORAL_TABLET | Freq: Two times a day (BID) | ORAL | 0 refills | Status: DC
  Filled 2022-11-28: qty 14, 7d supply, fill #0

## 2022-11-28 NOTE — Progress Notes (Signed)
Virtual Visit Consent   [name], you are scheduled for a virtual visit with a [facility] provider today. Just as with appointments in the office, your consent must be obtained to participate. Your consent will be active for this visit and any virtual visit you may have with one of our providers in the next 365 days. If you have a MyChart account, a copy of this consent can be sent to you electronically.  As this is a virtual visit, video technology does not allow for your provider to perform a traditional examination. This may limit your provider's ability to fully assess your condition. If your provider identifies any concerns that need to be evaluated in person or the need to arrange testing (such as labs, EKG, etc.), we will make arrangements to do so. Although advances in technology are sophisticated, we cannot ensure that it will always work on either your end or our end. If the connection with a video visit is poor, the visit may have to be switched to a telephone visit. With either a video or telephone visit, we are not always able to ensure that we have a secure connection.  By engaging in this virtual visit, you consent to the provision of healthcare and authorize for your insurance to be billed (if applicable) for the services provided during this visit. Depending on your insurance coverage, you may receive a charge related to this service.  I need to obtain your verbal consent now. Are you willing to proceed with your visit today? [name] has provided verbal consent on 11/28/2022 for a virtual visit (video or telephone). [name], [place]  Date: 11/28/2022 8:22 AM  Virtual Visit via Video Note   I, [name], connected with  [name]  ([id number], 1971-03-07) on 11/28/22 at  8:15 AM EDT by a video-enabled telemedicine application and verified that I am speaking with the correct person using two identifiers.  Location: Patient:  Virtual Visit Location Patient: Home Provider: Virtual Visit Location Provider: Home Office   I discussed the limitations of evaluation and management by telemedicine and the availability of in person appointments. The patient expressed understanding and agreed to proceed.    History of Present Illness: [name] is a 52 y.o. who identifies as a female who was assigned female at birth, and is being seen today for around 1.5 weeks of URI symptoms starting with fever and fatigue that got better but with continued and progressive nasal congestion, sinus pressure and substantial cough. Over past few days with worsening sinus pressure and discomfort.   OTC -- Dayquil, Robitussin, Ibuprofen  HPI: HPI  Problems:  Patient Active Problem List   Diagnosis Date Noted   Tachycardia 04/07/2016   Abdominal pain, epigastric    Nausea    Chest pressure 08/29/2014   Murmur 08/29/2014   Other migraine without status migrainosus, not intractable 08/29/2014    Allergies: No Known Allergies Medications:  Current Outpatient Medications:    amoxicillin-clavulanate (AUGMENTIN) 875-125 MG tablet, Take 1 tablet by mouth 2 (two) times daily., Disp: 14 tablet, Rfl: 0   benzonatate (TESSALON) 100 MG capsule, Take 1 capsule (100 mg total) by mouth 3 (three) times daily as needed for cough., Disp: 30 capsule, Rfl: 0   fluticasone (FLONASE) 50 MCG/ACT nasal spray, Place 2 sprays into both nostrils daily., Disp: 16 g, Rfl: 0   acetaminophen (TYLENOL) 325 MG tablet, Take 650 mg by mouth every 6 (six) hours as needed for  mild pain or headache., Disp: , Rfl:    cyclobenzaprine (FLEXERIL) 5 MG tablet, Take 1 tablet (5 mg total) by mouth 3 (three) times daily as needed for Muscle spasms for up to 10 days, Disp: 30 tablet, Rfl: 0   ibuprofen (ADVIL,MOTRIN) 200 MG tablet, Take 400 mg by mouth every 6 (six) hours as needed for headache or mild pain., Disp: , Rfl:    Multiple Vitamins tablet, Take 1 tablet by mouth  daily. , Disp: , Rfl:    ondansetron (ZOFRAN-ODT) 8 MG disintegrating tablet, Take 8 mg by mouth every 8 (eight) hours as needed for nausea or vomiting. , Disp: , Rfl:    ondansetron (ZOFRAN-ODT) 8 MG disintegrating tablet, Take 1 tablet (8 mg total) by mouth every 8 (eight) hours as needed for nausea., Disp: 20 tablet, Rfl: 5   rizatriptan (MAXALT) 10 MG tablet, Take 10 mg by mouth as needed for migraine. May repeat in 2 hours if needed, Disp: , Rfl:    rizatriptan (MAXALT) 10 MG tablet, Take 1 tablet (10 mg total) by mouth once as needed for Migraine for up to 1 dose. May take a second dose after 2 hours if needed., Disp: 30 tablet, Rfl: 1  Observations/Objective: Patient is well-developed, well-nourished in no acute distress.  Resting comfortably at home.  Head is normocephalic, atraumatic.  No labored breathing. Speech is clear and coherent with logical content.  Patient is alert and oriented at baseline.   Assessment and Plan: 1. Acute bacterial sinusitis - amoxicillin-clavulanate (AUGMENTIN) 875-125 MG tablet; Take 1 tablet by mouth 2 (two) times daily.  Dispense: 14 tablet; Refill: 0 - benzonatate (TESSALON) 100 MG capsule; Take 1 capsule (100 mg total) by mouth 3 (three) times daily as needed for cough.  Dispense: 30 capsule; Refill: 0  Rx Augmentin.  Increase fluids.  Rest.  Saline nasal spray.  Probiotic.  Mucinex as directed.  Humidifier in bedroom. Flonase and Tessalon per orders.  Call or return to clinic if symptoms are not improving.   Follow Up Instructions: I discussed the assessment and treatment plan with the patient. The patient was provided an opportunity to ask questions and all were answered. The patient agreed with the plan and demonstrated an understanding of the instructions.  A copy of instructions were sent to the patient via MyChart unless otherwise noted below.   The patient was advised to call back or seek an in-person evaluation if the symptoms worsen or if  the condition fails to improve as anticipated.  Time:  I spent 10 minutes with the patient via telehealth technology discussing the above problems/concerns.    Leslie Climes, PA-C

## 2022-11-28 NOTE — Patient Instructions (Signed)
[name], thank you for joining Piedad Climes, PA-C for today's virtual visit.  While this provider is not your primary care provider (PCP), if your PCP is located in our provider database this encounter information will be shared with them immediately following your visit.   A [facility] MyChart account gives you access to today's visit and all your visits, tests, and labs performed at [facility] " click here if you don't have a [facility] MyChart account or go to [email or web address]  Consent: (Patient) [name] provided verbal consent for this virtual visit at the beginning of the encounter.  Current Medications:  Current Outpatient Medications:    acetaminophen (TYLENOL) 325 MG tablet, Take 650 mg by mouth every 6 (six) hours as needed for mild pain or headache., Disp: , Rfl:    benzonatate (TESSALON) 100 MG capsule, Take 1 capsule (100 mg total) by mouth 3 (three) times daily as needed., Disp: 30 capsule, Rfl: 0   cyclobenzaprine (FLEXERIL) 5 MG tablet, Take 1 tablet (5 mg total) by mouth 3 (three) times daily as needed for Muscle spasms for up to 10 days, Disp: 30 tablet, Rfl: 0   ibuprofen (ADVIL,MOTRIN) 200 MG tablet, Take 400 mg by mouth every 6 (six) hours as needed for headache or mild pain., Disp: , Rfl:    Multiple Vitamins tablet, Take 1 tablet by mouth daily. , Disp: , Rfl:    ondansetron (ZOFRAN-ODT) 8 MG disintegrating tablet, Take 8 mg by mouth every 8 (eight) hours as needed for nausea or vomiting. , Disp: , Rfl:    ondansetron (ZOFRAN-ODT) 8 MG disintegrating tablet, Take 1 tablet (8 mg total) by mouth every 8 (eight) hours as needed for nausea., Disp: 20 tablet, Rfl: 5   predniSONE (DELTASONE) 20 MG tablet, Take 2 tablets (40 mg total) by mouth daily with breakfast., Disp: 10 tablet, Rfl: 0   promethazine-dextromethorphan (PROMETHAZINE-DM) 6.25-15 MG/5ML syrup, Take 5 mLs by mouth 4 (four) times daily as needed., Disp: 118  mL, Rfl: 0   rizatriptan (MAXALT) 10 MG tablet, Take 10 mg by mouth as needed for migraine. May repeat in 2 hours if needed, Disp: , Rfl:    rizatriptan (MAXALT) 10 MG tablet, Take 1 tablet (10 mg total) by mouth once as needed for Migraine for up to 1 dose. May take a second dose after 2 hours if needed., Disp: 30 tablet, Rfl: 1   Medications ordered in this encounter:  No orders of the defined types were placed in this encounter.    *If you need refills on other medications prior to your next appointment, please contact your pharmacy*  Follow-Up: Call back or seek an in-person evaluation if the symptoms worsen or if the condition fails to improve as anticipated.  [facility] [phone]  Other Instructions Please take antibiotic as directed.  Increase fluid intake.  Use Saline nasal spray.  Take a daily multivitamin. Start the [facility] [place] as directed. I have sent in a refill of the Tessalon for you. Ok to use warm compresses for any eye drainage noted -- if this is not improving over next 48 hours, let us know.  Place a humidifier in the bedroom.  Please call or return clinic if symptoms are not improving.  Sinusitis Sinusitis is redness, soreness, and swelling (inflammation) of the paranasal sinuses. Paranasal sinuses are air pockets within the bones of your face (beneath the eyes, the middle of the forehead, or above the eyes). In healthy paranasal  sinuses, mucus is able to drain out, and air is able to circulate through them by way of your nose. However, when your paranasal sinuses are inflamed, mucus and air can become trapped. This can allow bacteria and other germs to grow and cause infection. Sinusitis can develop quickly and last only a short time (acute) or continue over a long period (chronic). Sinusitis that lasts for more than 12 weeks is considered chronic.  CAUSES  Causes of sinusitis include: Allergies. Structural abnormalities, such as displacement of the  cartilage that separates your nostrils (deviated septum), which can decrease the air flow through your nose and sinuses and affect sinus drainage. Functional abnormalities, such as when the small hairs (cilia) that line your sinuses and help remove mucus do not work properly or are not present. SYMPTOMS  Symptoms of acute and chronic sinusitis are the same. The primary symptoms are pain and pressure around the affected sinuses. Other symptoms include: Upper toothache. Earache. Headache. Bad breath. Decreased sense of smell and taste. A cough, which worsens when you are lying flat. Fatigue. Fever. Thick drainage from your nose, which often is green and may contain pus (purulent). Swelling and warmth over the affected sinuses. DIAGNOSIS  Your caregiver will perform a physical exam. During the exam, your caregiver may: Look in your nose for signs of abnormal growths in your nostrils (nasal polyps). Tap over the affected sinus to check for signs of infection. View the inside of your sinuses (endoscopy) with a special imaging device with a light attached (endoscope), which is inserted into your sinuses. If your caregiver suspects that you have chronic sinusitis, one or more of the following tests may be recommended: Allergy tests. Nasal culture A sample of mucus is taken from your nose and sent to a lab and screened for bacteria. Nasal cytology A sample of mucus is taken from your nose and examined by your caregiver to determine if your sinusitis is related to an allergy. TREATMENT  Most cases of acute sinusitis are related to a viral infection and will resolve on their own within 10 days. Sometimes medicines are prescribed to help relieve symptoms (pain medicine, decongestants, nasal steroid sprays, or saline sprays).  However, for sinusitis related to a bacterial infection, your caregiver will prescribe antibiotic medicines. These are medicines that will help kill the bacteria causing the  infection.  Rarely, sinusitis is caused by a fungal infection. In theses cases, your caregiver will prescribe antifungal medicine. For some cases of chronic sinusitis, surgery is needed. Generally, these are cases in which sinusitis recurs more than 3 times per year, despite other treatments. HOME CARE INSTRUCTIONS  Drink plenty of water. Water helps thin the mucus so your sinuses can drain more easily. Use a humidifier. Inhale steam 3 to 4 times a day (for example, sit in the bathroom with the shower running). Apply a warm, moist washcloth to your face 3 to 4 times a day, or as directed by your caregiver. Use saline nasal sprays to help moisten and clean your sinuses. Take over-the-counter or prescription medicines for pain, discomfort, or fever only as directed by your caregiver. SEEK IMMEDIATE MEDICAL CARE IF: You have increasing pain or severe headaches. You have nausea, vomiting, or drowsiness. You have swelling around your face. You have vision problems. You have a stiff neck. You have difficulty breathing. MAKE SURE YOU:  Understand these instructions. Will watch your condition. Will get help right away if you are not doing well or get worse. Document Released: 05/19/2005  Document Revised: 08/11/2011 Document Reviewed: 06/03/2011 [facility] Patient Information 2014 [place], [place].    If you have been instructed to have an in-person evaluation today at a local Urgent Care facility, please use the link below. It will take you to a list of all of our available [facility], including address, phone number and hours of operation. Please do not delay care.  [facility]  If you or a family member do not have a primary care provider, use the link below to schedule a visit and establish care. When you choose a [facility] primary care physician or advanced practice provider, you gain a long-term partner in health. Find a Primary Care Provider  Learn more about  [facility] in-office and virtual care options:  - Get Care Now

## 2022-12-03 ENCOUNTER — Other Ambulatory Visit: Payer: Self-pay

## 2022-12-22 DIAGNOSIS — R059 Cough, unspecified: Secondary | ICD-10-CM | POA: Diagnosis not present

## 2022-12-22 DIAGNOSIS — J40 Bronchitis, not specified as acute or chronic: Secondary | ICD-10-CM | POA: Diagnosis not present

## 2023-01-05 DIAGNOSIS — R5381 Other malaise: Secondary | ICD-10-CM | POA: Diagnosis not present

## 2023-01-05 DIAGNOSIS — R5383 Other fatigue: Secondary | ICD-10-CM | POA: Diagnosis not present

## 2023-01-08 ENCOUNTER — Other Ambulatory Visit: Payer: Self-pay | Admitting: Internal Medicine

## 2023-01-08 DIAGNOSIS — N63 Unspecified lump in unspecified breast: Secondary | ICD-10-CM

## 2023-02-13 ENCOUNTER — Other Ambulatory Visit: Payer: Self-pay

## 2023-02-13 DIAGNOSIS — H168 Other keratitis: Secondary | ICD-10-CM | POA: Diagnosis not present

## 2023-02-13 MED ORDER — GATIFLOXACIN 0.5 % OP SOLN
1.0000 [drp] | OPHTHALMIC | 1 refills | Status: DC
  Filled 2023-02-13: qty 2.5, 6d supply, fill #0
  Filled 2023-06-29: qty 2.5, 6d supply, fill #1

## 2023-02-13 MED ORDER — ERYTHROMYCIN 5 MG/GM OP OINT
1.0000 | TOPICAL_OINTMENT | Freq: Every evening | OPHTHALMIC | 0 refills | Status: DC
  Filled 2023-02-13: qty 3.5, 7d supply, fill #0

## 2023-02-17 ENCOUNTER — Ambulatory Visit
Admission: RE | Admit: 2023-02-17 | Discharge: 2023-02-17 | Disposition: A | Discharge status: Home / Self Care | Payer: 59 | Source: Ambulatory Visit | Attending: Internal Medicine | Admitting: Internal Medicine

## 2023-02-17 DIAGNOSIS — N63 Unspecified lump in unspecified breast: Secondary | ICD-10-CM

## 2023-02-17 DIAGNOSIS — H16143 Punctate keratitis, bilateral: Secondary | ICD-10-CM | POA: Diagnosis not present

## 2023-02-17 DIAGNOSIS — R92322 Mammographic fibroglandular density, left breast: Secondary | ICD-10-CM | POA: Diagnosis not present

## 2023-02-17 DIAGNOSIS — N6002 Solitary cyst of left breast: Secondary | ICD-10-CM | POA: Diagnosis not present

## 2023-02-17 DIAGNOSIS — H02202 Unspecified lagophthalmos right lower eyelid: Secondary | ICD-10-CM | POA: Diagnosis not present

## 2023-02-17 DIAGNOSIS — R928 Other abnormal and inconclusive findings on diagnostic imaging of breast: Secondary | ICD-10-CM | POA: Diagnosis not present

## 2023-02-17 DIAGNOSIS — H02205 Unspecified lagophthalmos left lower eyelid: Secondary | ICD-10-CM | POA: Diagnosis not present

## 2023-02-17 DIAGNOSIS — H168 Other keratitis: Secondary | ICD-10-CM | POA: Diagnosis not present

## 2023-03-18 DIAGNOSIS — H04129 Dry eye syndrome of unspecified lacrimal gland: Secondary | ICD-10-CM | POA: Diagnosis not present

## 2023-03-18 DIAGNOSIS — H5213 Myopia, bilateral: Secondary | ICD-10-CM | POA: Diagnosis not present

## 2023-04-14 DIAGNOSIS — M25512 Pain in left shoulder: Secondary | ICD-10-CM | POA: Diagnosis not present

## 2023-04-14 DIAGNOSIS — M25511 Pain in right shoulder: Secondary | ICD-10-CM | POA: Diagnosis not present

## 2023-04-14 DIAGNOSIS — G8929 Other chronic pain: Secondary | ICD-10-CM | POA: Diagnosis not present

## 2023-04-14 DIAGNOSIS — M5080 Other cervical disc disorders, unspecified cervical region: Secondary | ICD-10-CM | POA: Diagnosis not present

## 2023-04-14 DIAGNOSIS — M4802 Spinal stenosis, cervical region: Secondary | ICD-10-CM | POA: Diagnosis not present

## 2023-06-15 ENCOUNTER — Ambulatory Visit: Payer: 59 | Attending: Internal Medicine

## 2023-06-15 DIAGNOSIS — M6281 Muscle weakness (generalized): Secondary | ICD-10-CM | POA: Insufficient documentation

## 2023-06-15 DIAGNOSIS — G8929 Other chronic pain: Secondary | ICD-10-CM | POA: Insufficient documentation

## 2023-06-15 DIAGNOSIS — M25512 Pain in left shoulder: Secondary | ICD-10-CM | POA: Insufficient documentation

## 2023-06-15 DIAGNOSIS — M25511 Pain in right shoulder: Secondary | ICD-10-CM | POA: Insufficient documentation

## 2023-06-15 NOTE — Therapy (Signed)
 PT Screening Form   Time: in_802__am___     Time out__817_am__   Complaint __chronic bilat shoulder pain_________________ Past Medical Hx:  __ migraine, mitral valve prolapse______________ Injury Date:________________________________  Patient's phone number:   Hx (this occurrence):   Pt is a pleasant 53 yo female seen for PT screen due to reports intermittent chronic bilateral shoulder pain. She reports onset six years ago, but current episode started about six months ago. Not sure of initial MOI, possible onset after change in sleeping surface years ago. Pt describes pain as sharp, can last at low level for hours. Takes ibuprofen to help. Pain limits reaching movements. Pt recently saw PA-C in late 2024, had XRAYS taken which she reports were negative. L shoulder is worse than R. Depending on sleeping position hands can go numb.  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   Assessment:   AROM: limited, particularly bilat flexion and abduction. ER/IR WFL for AROM, but painful with ER. PROM: does not achieve full PROM with flex/abduct, but is increased compared to AROM.  MMT: Abduction, pain-limited bilaterally, 4/5 bilat Flexion: 4/5 bilaterally ER/IR: 4-/5 L, 4/5 R, pain-limited more on L  Reviewed one set of the following interventions during screen. Pt able to complete pain-free: Access Code: [email or web address] URL: [email or web address] Date: 06/15/2023 Prepared by: Darryle Patten  Exercises - Seated Shoulder Shrugs  - 2 x daily - 7 x weekly - 2 sets - 10 reps - Seated Shoulder Shrug Circles AROM Backward  - 2 x daily - 7 x weekly - 2 sets - 10 reps - Seated Scapular Retraction  - 1 x daily - 7 x weekly - 2 sets - 10 reps  Other comments: Provided the above HEP to pt. Did discuss to perform until can be seen by PT again, but discontinue if pain worsens. Currently no pain with interventions.   Recommendations: Further PT recommended. Pt verbalizes  understanding.   Comments: Pt is a pleasant 53 yo female with chronic bilateral shoulder pain, current episode started 6 months ago.  Pt with impairments of strength, pain and AROM bilaterally. PT recommends pt seek referral for full course of PT at this time to address impairments. Pt verbalizes understanding.    []  Patient would benefit from an MD referral [x]  Patient would benefit from a full PT/OT/ SLP evaluation and treatment. []  No intervention recommended at this time.

## 2023-06-29 ENCOUNTER — Other Ambulatory Visit: Payer: Self-pay

## 2023-06-29 MED ORDER — ERYTHROMYCIN 5 MG/GM OP OINT
TOPICAL_OINTMENT | Freq: Every day | OPHTHALMIC | 0 refills | Status: DC
  Filled 2023-06-29: qty 3.5, 7d supply, fill #0

## 2023-06-30 ENCOUNTER — Other Ambulatory Visit: Payer: Self-pay

## 2023-07-03 ENCOUNTER — Other Ambulatory Visit: Payer: Self-pay

## 2023-07-03 DIAGNOSIS — Z0000 Encounter for general adult medical examination without abnormal findings: Secondary | ICD-10-CM | POA: Diagnosis not present

## 2023-07-03 DIAGNOSIS — G43109 Migraine with aura, not intractable, without status migrainosus: Secondary | ICD-10-CM | POA: Diagnosis not present

## 2023-07-03 DIAGNOSIS — M25512 Pain in left shoulder: Secondary | ICD-10-CM | POA: Diagnosis not present

## 2023-07-03 DIAGNOSIS — G8929 Other chronic pain: Secondary | ICD-10-CM | POA: Diagnosis not present

## 2023-07-03 DIAGNOSIS — M25511 Pain in right shoulder: Secondary | ICD-10-CM | POA: Diagnosis not present

## 2023-07-03 DIAGNOSIS — I341 Nonrheumatic mitral (valve) prolapse: Secondary | ICD-10-CM | POA: Diagnosis not present

## 2023-07-03 MED ORDER — MELOXICAM 15 MG PO TABS
15.0000 mg | ORAL_TABLET | Freq: Every day | ORAL | 2 refills | Status: DC | PRN
  Filled 2023-07-03: qty 20, 20d supply, fill #0
  Filled 2023-07-03: qty 10, 10d supply, fill #0

## 2023-07-03 MED ORDER — METHYLPREDNISOLONE 4 MG PO TBPK
ORAL_TABLET | ORAL | 0 refills | Status: DC
  Filled 2023-07-03: qty 21, 6d supply, fill #0

## 2023-07-18 IMAGING — MG MM DIGITAL SCREENING BILAT W/ TOMO AND CAD
6 of 12 series · 6 of 36 positions shown · non-contrast
Comparison: Previous exam(s).

CLINICAL DATA: Screening.

EXAM:
DIGITAL SCREENING BILATERAL MAMMOGRAM WITH TOMOSYNTHESIS AND CAD
TECHNIQUE: Bilateral screening digital craniocaudal and mediolateral oblique
mammograms were obtained. Bilateral screening digital breast
tomosynthesis was performed. The images were evaluated with
computer-aided detection.

[L MLO synth-2D (1 of 2)]
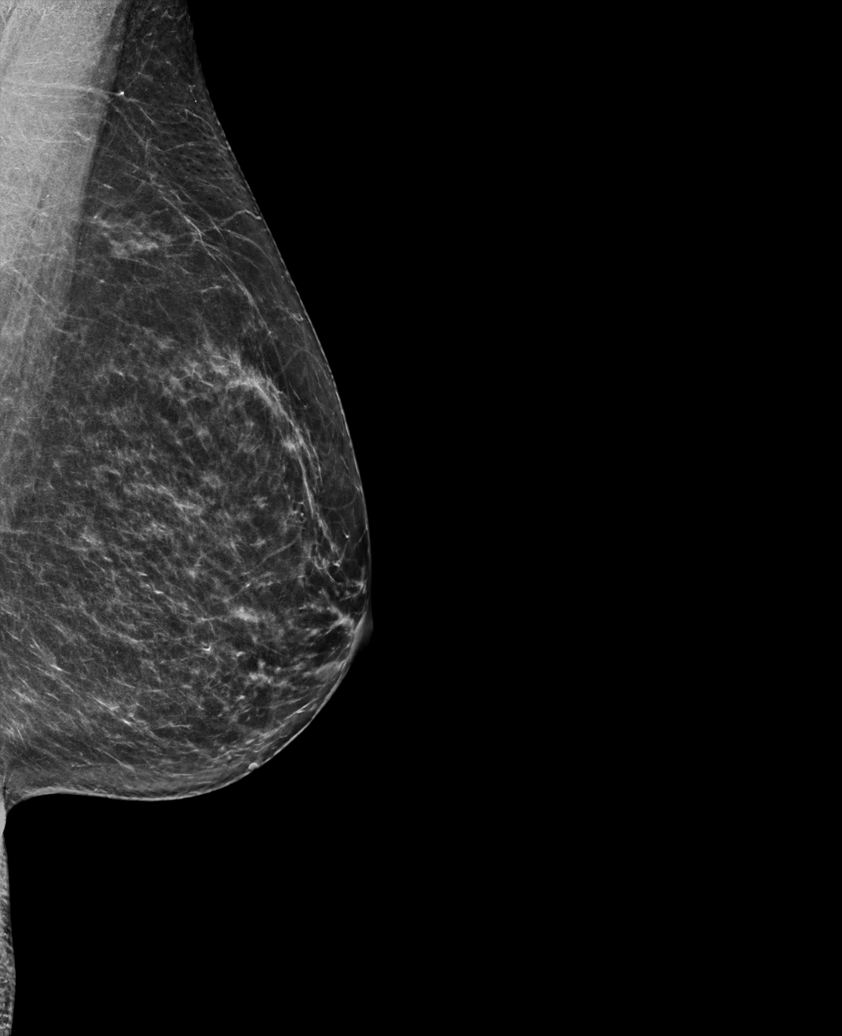

[L MLO synth-2D (2 of 2)]
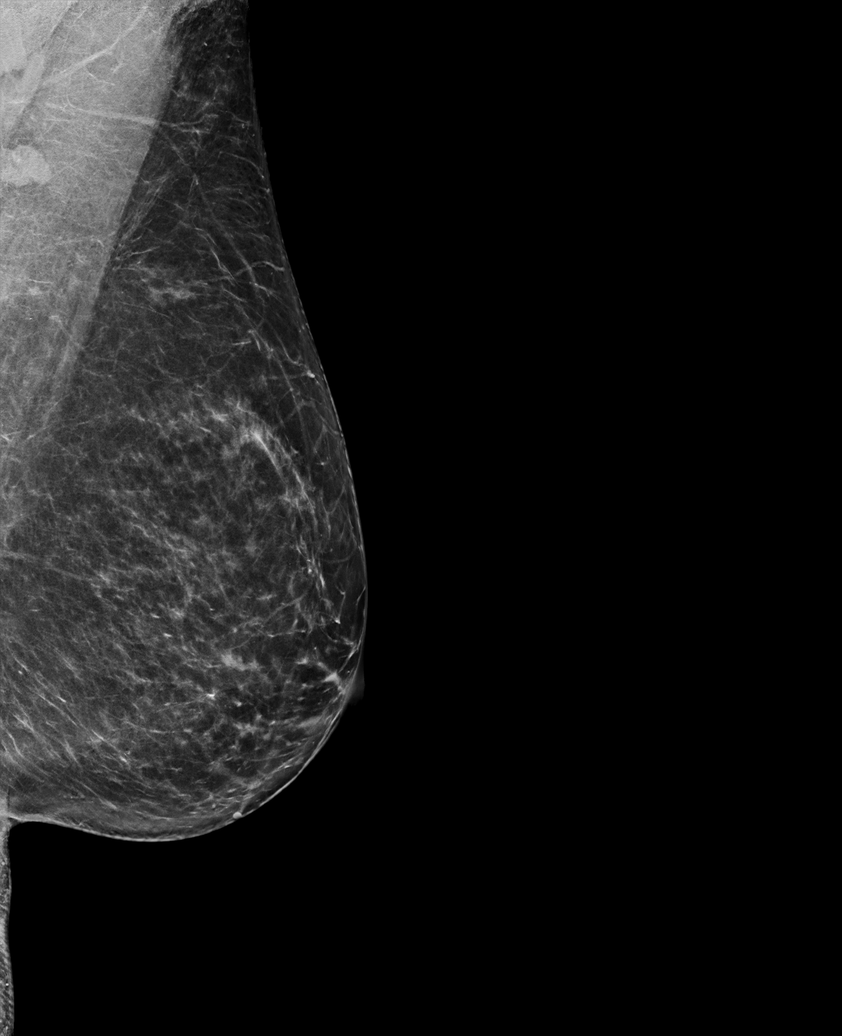

[R MLO synth-2D]
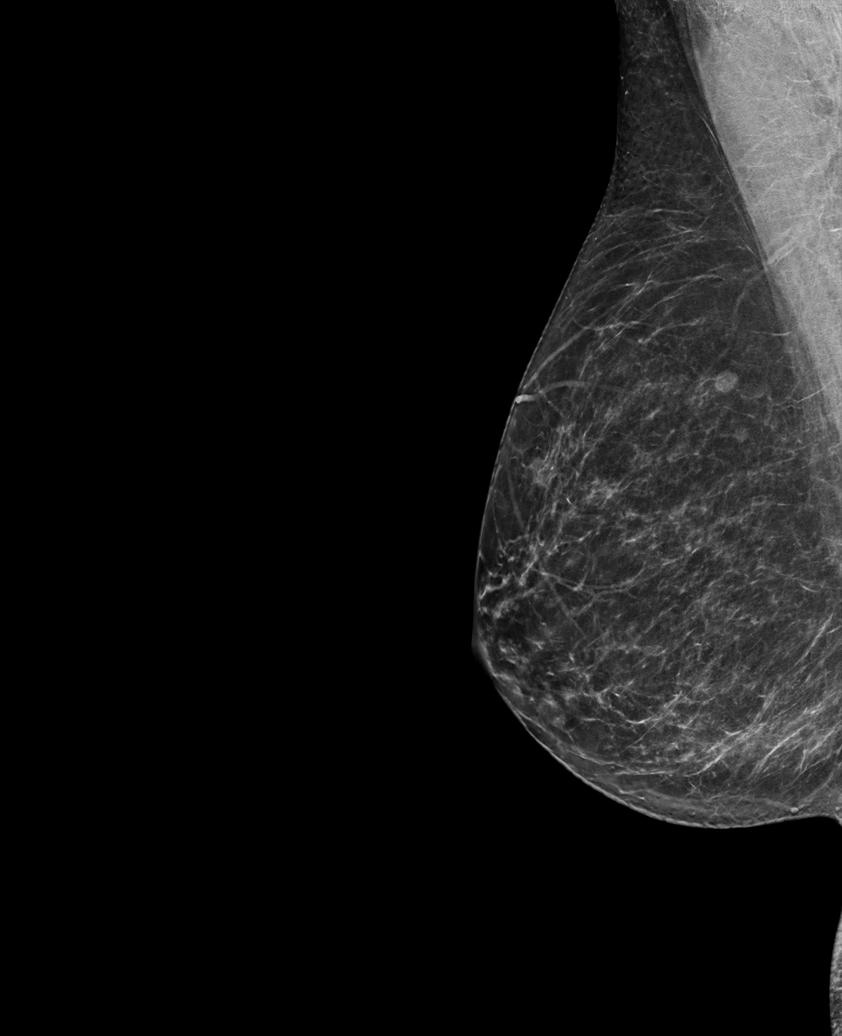

[L CC synth-2D (1 of 2)]
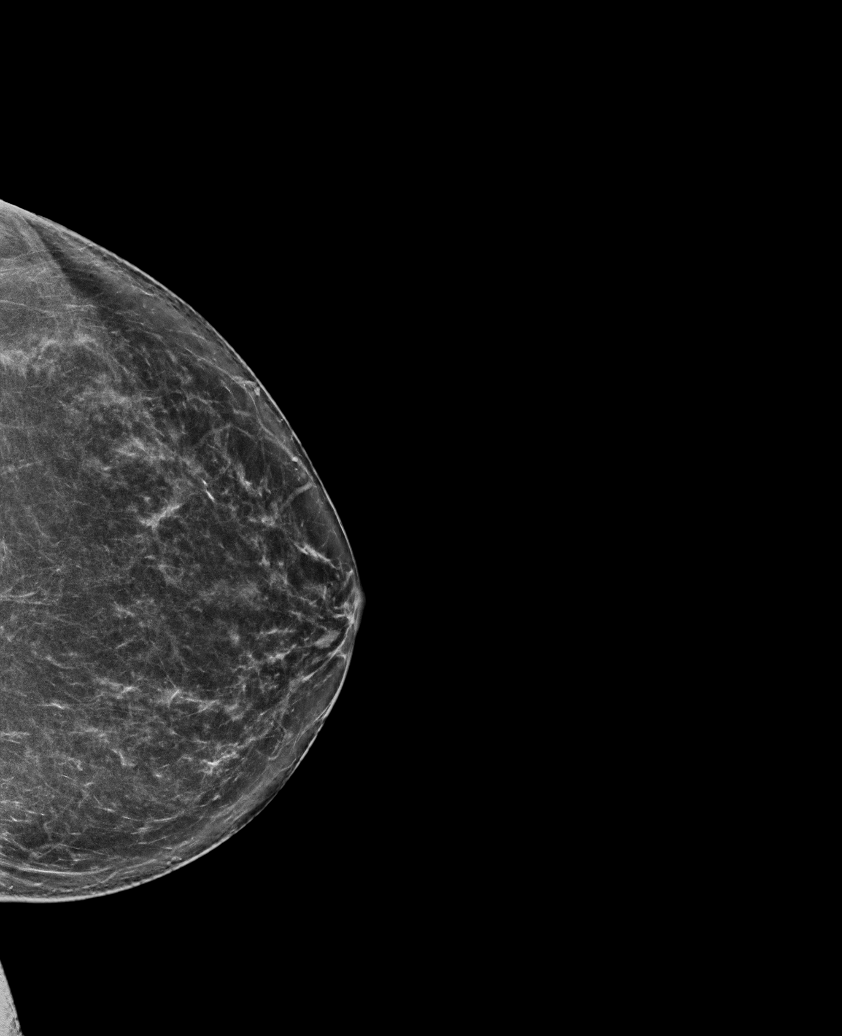

[R CC synth-2D]
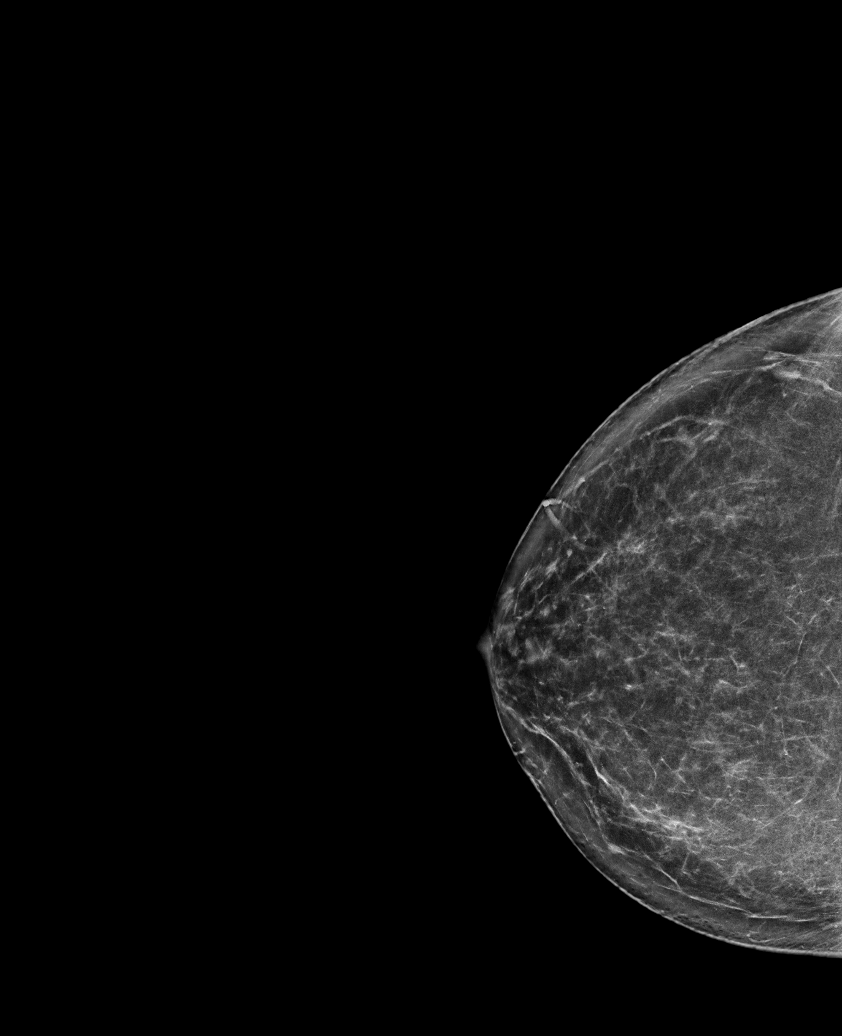

[L CC synth-2D (2 of 2)]
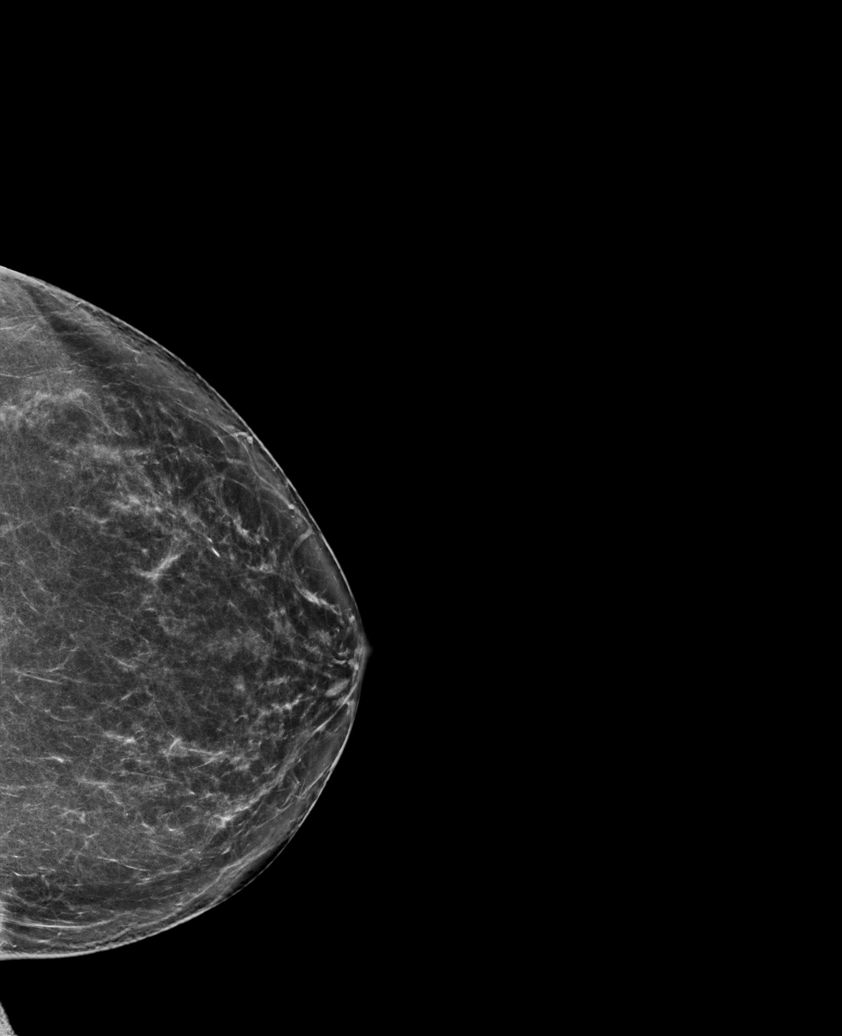

[6 of 36 positions shown; findings below may reference images not displayed]

ACR Breast Density Category b: There are scattered areas of
fibroglandular density.
FINDINGS: There are no findings suspicious for malignancy.
IMPRESSION: No mammographic evidence of malignancy. A result letter of this
screening mammogram will be mailed directly to the patient.

RECOMMENDATION:
Screening mammogram in one year. (Code:51-O-LD2)

BI-RADS CATEGORY  1: Negative.

## 2023-08-05 ENCOUNTER — Other Ambulatory Visit: Payer: Self-pay

## 2023-08-05 MED ORDER — RIZATRIPTAN BENZOATE 10 MG PO TABS
10.0000 mg | ORAL_TABLET | ORAL | 1 refills | Status: DC | PRN
  Filled 2023-08-05: qty 27, 30d supply, fill #0
  Filled 2023-12-28: qty 27, 30d supply, fill #1

## 2023-08-05 MED ORDER — METHYLPREDNISOLONE 4 MG PO TBPK
ORAL_TABLET | ORAL | 0 refills | Status: DC
  Filled 2023-08-05: qty 21, 6d supply, fill #0

## 2023-08-05 MED ORDER — ONDANSETRON 8 MG PO TBDP
8.0000 mg | ORAL_TABLET | Freq: Three times a day (TID) | ORAL | 5 refills | Status: DC | PRN
  Filled 2023-08-05: qty 20, 7d supply, fill #0
  Filled 2023-12-28: qty 20, 7d supply, fill #1

## 2023-08-11 ENCOUNTER — Encounter: Payer: Self-pay | Admitting: Internal Medicine

## 2023-08-13 ENCOUNTER — Other Ambulatory Visit: Payer: Self-pay | Admitting: Internal Medicine

## 2023-08-13 DIAGNOSIS — N6489 Other specified disorders of breast: Secondary | ICD-10-CM

## 2023-08-13 DIAGNOSIS — Z1231 Encounter for screening mammogram for malignant neoplasm of breast: Secondary | ICD-10-CM

## 2023-09-29 ENCOUNTER — Ambulatory Visit
Admission: RE | Admit: 2023-09-29 | Discharge: 2023-09-29 | Disposition: A | Discharge status: Home / Self Care | Source: Ambulatory Visit | Attending: Internal Medicine | Admitting: Internal Medicine

## 2023-09-29 DIAGNOSIS — N6489 Other specified disorders of breast: Secondary | ICD-10-CM

## 2023-09-29 DIAGNOSIS — N6002 Solitary cyst of left breast: Secondary | ICD-10-CM | POA: Diagnosis not present

## 2023-09-29 DIAGNOSIS — Z1231 Encounter for screening mammogram for malignant neoplasm of breast: Secondary | ICD-10-CM

## 2023-09-29 DIAGNOSIS — R92323 Mammographic fibroglandular density, bilateral breasts: Secondary | ICD-10-CM | POA: Diagnosis not present

## 2023-09-29 DIAGNOSIS — R928 Other abnormal and inconclusive findings on diagnostic imaging of breast: Secondary | ICD-10-CM | POA: Diagnosis not present

## 2023-09-29 DIAGNOSIS — N6325 Unspecified lump in the left breast, overlapping quadrants: Secondary | ICD-10-CM | POA: Diagnosis not present

## 2023-12-28 ENCOUNTER — Other Ambulatory Visit: Payer: Self-pay | Admitting: Family Medicine

## 2023-12-28 ENCOUNTER — Other Ambulatory Visit: Payer: Self-pay

## 2023-12-28 DIAGNOSIS — R7309 Other abnormal glucose: Secondary | ICD-10-CM | POA: Diagnosis not present

## 2023-12-28 DIAGNOSIS — M25611 Stiffness of right shoulder, not elsewhere classified: Secondary | ICD-10-CM

## 2023-12-28 DIAGNOSIS — M542 Cervicalgia: Secondary | ICD-10-CM

## 2023-12-28 DIAGNOSIS — M25511 Pain in right shoulder: Secondary | ICD-10-CM | POA: Diagnosis not present

## 2023-12-28 DIAGNOSIS — G8929 Other chronic pain: Secondary | ICD-10-CM | POA: Diagnosis not present

## 2023-12-28 MED ORDER — GABAPENTIN 100 MG PO CAPS
100.0000 mg | ORAL_CAPSULE | Freq: Every day | ORAL | 1 refills | Status: DC
  Filled 2023-12-28: qty 30, 30d supply, fill #0

## 2023-12-29 ENCOUNTER — Ambulatory Visit
Admission: RE | Admit: 2023-12-29 | Discharge: 2023-12-29 | Disposition: A | Discharge status: Home / Self Care | Source: Ambulatory Visit | Attending: Family Medicine | Admitting: Family Medicine

## 2023-12-29 DIAGNOSIS — G8929 Other chronic pain: Secondary | ICD-10-CM | POA: Diagnosis not present

## 2023-12-29 DIAGNOSIS — M542 Cervicalgia: Secondary | ICD-10-CM | POA: Diagnosis not present

## 2023-12-29 DIAGNOSIS — M4802 Spinal stenosis, cervical region: Secondary | ICD-10-CM | POA: Diagnosis not present

## 2023-12-29 DIAGNOSIS — M7581 Other shoulder lesions, right shoulder: Secondary | ICD-10-CM | POA: Diagnosis not present

## 2023-12-29 DIAGNOSIS — M25611 Stiffness of right shoulder, not elsewhere classified: Secondary | ICD-10-CM | POA: Diagnosis not present

## 2023-12-29 DIAGNOSIS — M75111 Incomplete rotator cuff tear or rupture of right shoulder, not specified as traumatic: Secondary | ICD-10-CM | POA: Diagnosis not present

## 2023-12-29 DIAGNOSIS — M50222 Other cervical disc displacement at C5-C6 level: Secondary | ICD-10-CM | POA: Diagnosis not present

## 2023-12-29 DIAGNOSIS — M75121 Complete rotator cuff tear or rupture of right shoulder, not specified as traumatic: Secondary | ICD-10-CM | POA: Diagnosis not present

## 2023-12-29 DIAGNOSIS — M50223 Other cervical disc displacement at C6-C7 level: Secondary | ICD-10-CM | POA: Diagnosis not present

## 2023-12-29 DIAGNOSIS — M25511 Pain in right shoulder: Secondary | ICD-10-CM | POA: Insufficient documentation

## 2023-12-29 DIAGNOSIS — R200 Anesthesia of skin: Secondary | ICD-10-CM | POA: Diagnosis not present

## 2024-01-11 DIAGNOSIS — M75121 Complete rotator cuff tear or rupture of right shoulder, not specified as traumatic: Secondary | ICD-10-CM | POA: Diagnosis not present

## 2024-01-11 DIAGNOSIS — G5603 Carpal tunnel syndrome, bilateral upper limbs: Secondary | ICD-10-CM | POA: Diagnosis not present

## 2024-01-13 ENCOUNTER — Other Ambulatory Visit: Payer: Self-pay | Admitting: Orthopedic Surgery

## 2024-01-20 ENCOUNTER — Encounter: Payer: Self-pay | Admitting: Orthopedic Surgery

## 2024-01-25 ENCOUNTER — Inpatient Hospital Stay
Admission: RE | Admit: 2024-01-25 | Discharge: 2024-01-25 | Disposition: A | Discharge status: Home / Self Care | Payer: Self-pay | Source: Ambulatory Visit | Attending: Neurosurgery | Admitting: Neurosurgery

## 2024-01-25 ENCOUNTER — Other Ambulatory Visit: Payer: Self-pay | Admitting: Family Medicine

## 2024-01-25 DIAGNOSIS — Z049 Encounter for examination and observation for unspecified reason: Secondary | ICD-10-CM

## 2024-01-28 NOTE — Discharge Instructions (Signed)
POLAR CARE INFORMATION  [email or web address]  How to use [employer] Polar Care [place] Cold Therapy System?  YouTube   https://www.youtube.com/watch?[email or web address]  OPERATING INSTRUCTIONS  Start the product With dry hands, connect the transformer to the electrical connection located on the top of the cooler. Next, plug the transformer into an appropriate electrical outlet. The unit will automatically start running at this point.  To stop the pump, disconnect electrical power.  Unplug to stop the product when not in use. Unplugging the Polar Care unit turns it off. Always unplug immediately after use. Never leave it plugged in while unattended. Remove pad.    FIRST ADD WATER TO FILL LINE, THEN ICE---Replace ice when existing ice is almost melted  1 Discuss Treatment with your Licensed Health Care Practitioner and Use Only as Prescribed 2 Apply Insulation Barrier & Cold Therapy Pad 3 Check for Moisture 4 Inspect Skin Regularly  Tips and Trouble Shooting Usage Tips 1. Use cubed or chunked ice for optimal performance. 2. It is recommended to drain the Pad between uses. To drain the pad, hold the Pad upright with the hose pointed toward the ground. Depress the black plunger and allow water to drain out. 3. You may disconnect the Pad from the unit without removing the pad from the affected area by depressing the silver tabs on the hose coupling and gently pulling the hoses apart. The Pad and unit will seal itself and will not leak. Note: Some dripping during release is normal. 4. DO NOT RUN PUMP WITHOUT WATER! The pump in this unit is designed to run with water. Running the unit without water will cause permanent damage to the pump. 5. Unplug unit before removing lid.  TROUBLESHOOTING GUIDE Pump not running, Water not flowing to the pad, Pad is not getting cold 1. Make sure the transformer is plugged into the wall outlet. 2. Confirm that the ice and water are filled to the indicated levels. 3. Make sure  there are no kinks in the pad. 4. Gently pull on the blue tube to make sure the tube/pad junction is straight. 5. Remove the pad from the treatment site and ll it while the pad is lying at; then reapply. 6. Confirm that the pad couplings are securely attached to the unit. Listen for the double clicks (Figure 1) to confirm the pad couplings are securely attached.  Leaks    Note: Some condensation on the lines, controller, and pads is unavoidable, especially in warmer climates. 1. If using a [employer] Polar Care Cold Therapy unit with a detachable Cold Therapy Pad, and a leak exists (other than condensation on the lines) disconnect the pad couplings. Make sure the silver tabs on the couplings are depressed before reconnecting the pad to the pump hose; then confirm both sides of the coupling are properly clicked in. 2. If the coupling continues to leak or a leak is detected in the pad itself, stop using it and call [employer] Customer Care at [phone].  Cleaning After use, empty and dry the unit with a soft cloth. Warm water and mild detergent may be used occasionally to clean the pump and tubes.  WARNING: The Polar Care Cube can be cold enough to cause serious injury, including full skin necrosis. Follow these Operating Instructions, and carefully read the Product Insert (see pouch on side of unit) and the Cold Therapy Pad Fitting Instructions (provided with each Cold Therapy Pad) prior to use.        

## 2024-01-28 NOTE — Anesthesia Preprocedure Evaluation (Signed)
 Anesthesia Evaluation  Patient identified by MRN, date of birth, ID band Patient awake    Reviewed: Allergy & Precautions, NPO status , Patient's Chart, lab work & pertinent test results  History of Anesthesia Complications Negative for: history of anesthetic complications  Airway Mallampati: I   Neck ROM: Full    Dental  (+) Partial Upper, Missing   Pulmonary neg pulmonary ROS   Pulmonary exam normal breath sounds clear to auscultation       Cardiovascular Exercise Tolerance: Good Normal cardiovascular exam+ Valvular Problems/Murmurs MVP  Rhythm:Regular Rate:Normal     Neuro/Psych  Headaches Chronic pain    GI/Hepatic negative GI ROS,,,  Endo/Other  negative endocrine ROS    Renal/GU negative Renal ROS     Musculoskeletal   Abdominal   Peds  Hematology negative hematology ROS (+)   Anesthesia Other Findings   Reproductive/Obstetrics                              Anesthesia Physical Anesthesia Plan  ASA: 2  Anesthesia Plan: General and Regional   Post-op Pain Management: Regional block   Induction: Intravenous  PONV Risk Score and Plan: 3 and Ondansetron , Dexamethasone  and Treatment may vary due to age or medical condition  Airway Management Planned: Oral ETT  Additional Equipment:   Intra-op Plan:   Post-operative Plan: Extubation in OR  Informed Consent: I have reviewed the patients History and Physical, chart, labs and discussed the procedure including the risks, benefits and alternatives for the proposed anesthesia with the patient or authorized representative who has indicated his/her understanding and acceptance.     Dental advisory given  Plan Discussed with: CRNA  Anesthesia Plan Comments: (Plan for preoperative interscalene nerve block and GETA. Patient consented for risks of anesthesia including but not limited to:  - adverse reactions to medications -  damage to eyes, teeth, lips or other oral mucosa - nerve damage due to positioning  - sore throat or hoarseness - damage to heart, brain, nerves, lungs, other parts of body or loss of life  Informed patient about role of CRNA in peri- and intra-operative care.  Patient voiced understanding.)         Anesthesia Quick Evaluation

## 2024-01-29 ENCOUNTER — Ambulatory Visit
Admission: RE | Admit: 2024-01-29 | Discharge: 2024-01-29 | Disposition: A | Discharge status: Home / Self Care | Attending: Orthopedic Surgery | Admitting: Orthopedic Surgery

## 2024-01-29 ENCOUNTER — Other Ambulatory Visit: Payer: Self-pay

## 2024-01-29 ENCOUNTER — Ambulatory Visit: Payer: Self-pay | Admitting: Anesthesiology

## 2024-01-29 ENCOUNTER — Encounter
Admission: RE | Disposition: A | Discharge status: Home / Self Care | Payer: Self-pay | Source: Home / Self Care | Attending: Orthopedic Surgery

## 2024-01-29 ENCOUNTER — Encounter: Payer: Self-pay | Admitting: Orthopedic Surgery

## 2024-01-29 DIAGNOSIS — M75101 Unspecified rotator cuff tear or rupture of right shoulder, not specified as traumatic: Secondary | ICD-10-CM | POA: Diagnosis not present

## 2024-01-29 DIAGNOSIS — I341 Nonrheumatic mitral (valve) prolapse: Secondary | ICD-10-CM | POA: Diagnosis not present

## 2024-01-29 DIAGNOSIS — M7521 Bicipital tendinitis, right shoulder: Secondary | ICD-10-CM | POA: Diagnosis not present

## 2024-01-29 DIAGNOSIS — M7541 Impingement syndrome of right shoulder: Secondary | ICD-10-CM | POA: Diagnosis not present

## 2024-01-29 DIAGNOSIS — M25811 Other specified joint disorders, right shoulder: Secondary | ICD-10-CM | POA: Insufficient documentation

## 2024-01-29 DIAGNOSIS — M7581 Other shoulder lesions, right shoulder: Secondary | ICD-10-CM | POA: Diagnosis not present

## 2024-01-29 DIAGNOSIS — G8929 Other chronic pain: Secondary | ICD-10-CM | POA: Diagnosis not present

## 2024-01-29 DIAGNOSIS — M75121 Complete rotator cuff tear or rupture of right shoulder, not specified as traumatic: Secondary | ICD-10-CM | POA: Diagnosis not present

## 2024-01-29 DIAGNOSIS — G8918 Other acute postprocedural pain: Secondary | ICD-10-CM | POA: Diagnosis not present

## 2024-01-29 HISTORY — DX: Presence of spectacles and contact lenses: Z97.3

## 2024-01-29 HISTORY — PX: SUBACROMIAL DECOMPRESSION: SHX5174

## 2024-01-29 HISTORY — PX: BICEPT TENODESIS: SHX5116

## 2024-01-29 HISTORY — PX: SHOULDER ARTHROSCOPY WITH OPEN ROTATOR CUFF REPAIR: SHX6092

## 2024-01-29 SURGERY — ARTHROSCOPY, SHOULDER WITH REPAIR, ROTATOR CUFF, OPEN
Anesthesia: Regional | Site: Shoulder | Laterality: Right

## 2024-01-29 MED ORDER — ONDANSETRON 4 MG PO TBDP
4.0000 mg | ORAL_TABLET | Freq: Three times a day (TID) | ORAL | 0 refills | Status: DC | PRN
  Filled 2024-01-29: qty 20, 7d supply, fill #0

## 2024-01-29 MED ORDER — OXYCODONE HCL 5 MG PO TABS: 5.0000 mg | ORAL_TABLET | Freq: Once | ORAL | Status: DC | PRN

## 2024-01-29 MED ORDER — DEXMEDETOMIDINE HCL IN NACL 80 MCG/20ML IV SOLN
INTRAVENOUS | Status: AC
  Filled 2024-01-29: qty 20

## 2024-01-29 MED ORDER — CEFAZOLIN SODIUM-DEXTROSE 2-3 GM-%(50ML) IV SOLR
INTRAVENOUS | Status: AC
  Filled 2024-01-29: qty 50

## 2024-01-29 MED ORDER — OXYCODONE HCL 5 MG PO TABS
5.0000 mg | ORAL_TABLET | ORAL | 0 refills | Status: DC | PRN
  Filled 2024-01-29: qty 30, 3d supply, fill #0

## 2024-01-29 MED ORDER — DEXAMETHASONE SODIUM PHOSPHATE 4 MG/ML IJ SOLN
INTRAMUSCULAR | Status: AC
Start: 2024-01-29 — End: 2024-01-29
  Filled 2024-01-29: qty 1

## 2024-01-29 MED ORDER — PHENYLEPHRINE 80 MCG/ML (10ML) SYRINGE FOR IV PUSH (FOR BLOOD PRESSURE SUPPORT)
PREFILLED_SYRINGE | INTRAVENOUS | Status: AC
Start: 2024-01-29 — End: 2024-01-29
  Filled 2024-01-29: qty 10

## 2024-01-29 MED ORDER — LACTATED RINGERS IV SOLN
INTRAVENOUS | Status: DC | PRN
  Administered 2024-01-29: 4 mL

## 2024-01-29 MED ORDER — ROCURONIUM BROMIDE 10 MG/ML (PF) SYRINGE
PREFILLED_SYRINGE | INTRAVENOUS | Status: AC
  Filled 2024-01-29: qty 10

## 2024-01-29 MED ORDER — BUPIVACAINE HCL (PF) 0.5 % IJ SOLN
INTRAMUSCULAR | Status: AC
Start: 2024-01-29 — End: 2024-01-29
  Filled 2024-01-29: qty 10

## 2024-01-29 MED ORDER — SUGAMMADEX SODIUM 200 MG/2ML IV SOLN
INTRAVENOUS | Status: AC
  Filled 2024-01-29: qty 2

## 2024-01-29 MED ORDER — MIDAZOLAM HCL 2 MG/2ML IJ SOLN
INTRAMUSCULAR | Status: AC
  Filled 2024-01-29: qty 2

## 2024-01-29 MED ORDER — CEFAZOLIN SODIUM-DEXTROSE 2-4 GM/100ML-% IV SOLN
2.0000 g | INTRAVENOUS | Status: AC
  Administered 2024-01-29: 2 g via INTRAVENOUS

## 2024-01-29 MED ORDER — ASPIRIN 325 MG PO TBEC
325.0000 mg | DELAYED_RELEASE_TABLET | Freq: Every day | ORAL | 0 refills | Status: AC
  Filled 2024-01-29: qty 14, 14d supply, fill #0

## 2024-01-29 MED ORDER — PROPOFOL 10 MG/ML IV BOLUS
INTRAVENOUS | Status: DC | PRN
  Administered 2024-01-29: 150 mg via INTRAVENOUS

## 2024-01-29 MED ORDER — LACTATED RINGERS IV SOLN: INTRAVENOUS | Status: DC

## 2024-01-29 MED ORDER — ROCURONIUM BROMIDE 100 MG/10ML IV SOLN
INTRAVENOUS | Status: DC | PRN
Start: 2024-01-29 — End: 2024-01-29
  Administered 2024-01-29: 40 mg via INTRAVENOUS

## 2024-01-29 MED ORDER — PHENYLEPHRINE HCL (PRESSORS) 10 MG/ML IV SOLN
INTRAVENOUS | Status: DC | PRN
  Administered 2024-01-29 (×2): 80 ug via INTRAVENOUS

## 2024-01-29 MED ORDER — FENTANYL CITRATE (PF) 100 MCG/2ML IJ SOLN
INTRAMUSCULAR | Status: AC
  Filled 2024-01-29: qty 2

## 2024-01-29 MED ORDER — ACETAMINOPHEN 10 MG/ML IV SOLN: 1000.0000 mg | Freq: Once | INTRAVENOUS | Status: DC | PRN

## 2024-01-29 MED ORDER — BUPIVACAINE LIPOSOME 1.3 % IJ SUSP
INTRAMUSCULAR | Status: DC | PRN
  Administered 2024-01-29: 20 mL

## 2024-01-29 MED ORDER — BUPIVACAINE LIPOSOME 1.3 % IJ SUSP
INTRAMUSCULAR | Status: AC
  Filled 2024-01-29: qty 20

## 2024-01-29 MED ORDER — DEXAMETHASONE SODIUM PHOSPHATE 4 MG/ML IJ SOLN
INTRAMUSCULAR | Status: DC | PRN
  Administered 2024-01-29: 4 mg via INTRAVENOUS

## 2024-01-29 MED ORDER — FENTANYL CITRATE (PF) 100 MCG/2ML IJ SOLN
100.0000 ug | INTRAMUSCULAR | Status: AC | PRN
  Administered 2024-01-29 (×2): 50 ug via INTRAVENOUS

## 2024-01-29 MED ORDER — ONDANSETRON HCL 4 MG/2ML IJ SOLN
INTRAMUSCULAR | Status: DC | PRN
  Administered 2024-01-29: 4 mg via INTRAVENOUS

## 2024-01-29 MED ORDER — MIDAZOLAM HCL 2 MG/2ML IJ SOLN
2.0000 mg | INTRAMUSCULAR | Status: DC | PRN
  Administered 2024-01-29: 2 mg via INTRAVENOUS

## 2024-01-29 MED ORDER — FENTANYL CITRATE (PF) 100 MCG/2ML IJ SOLN
INTRAMUSCULAR | Status: AC
Start: 2024-01-29 — End: 2024-01-29
  Filled 2024-01-29: qty 2

## 2024-01-29 MED ORDER — ACETAMINOPHEN 500 MG PO TABS
1000.0000 mg | ORAL_TABLET | Freq: Three times a day (TID) | ORAL | 2 refills | Status: AC
  Filled 2024-01-29: qty 90, 15d supply, fill #0

## 2024-01-29 MED ORDER — SUCCINYLCHOLINE CHLORIDE 200 MG/10ML IV SOSY
PREFILLED_SYRINGE | INTRAVENOUS | Status: AC
  Filled 2024-01-29: qty 10

## 2024-01-29 MED ORDER — LACTATED RINGERS IR SOLN
Status: DC | PRN
  Administered 2024-01-29: 3000 mL
  Administered 2024-01-29: 12000 mL
  Administered 2024-01-29: 3000 mL

## 2024-01-29 MED ORDER — LIDOCAINE HCL (PF) 2 % IJ SOLN
INTRAMUSCULAR | Status: AC
Start: 2024-01-29 — End: 2024-01-29
  Filled 2024-01-29: qty 5

## 2024-01-29 MED ORDER — PROPOFOL 10 MG/ML IV BOLUS
INTRAVENOUS | Status: AC
Start: 2024-01-29 — End: 2024-01-29
  Filled 2024-01-29: qty 20

## 2024-01-29 MED ORDER — ONDANSETRON HCL 4 MG/2ML IJ SOLN: 4.0000 mg | Freq: Once | INTRAMUSCULAR | Status: DC | PRN

## 2024-01-29 MED ORDER — OXYCODONE HCL 5 MG/5ML PO SOLN: 5.0000 mg | Freq: Once | ORAL | Status: DC | PRN

## 2024-01-29 MED ORDER — BUPIVACAINE HCL (PF) 0.5 % IJ SOLN
INTRAMUSCULAR | Status: DC | PRN
  Administered 2024-01-29: 10 mL

## 2024-01-29 MED ORDER — ONDANSETRON HCL 4 MG/2ML IJ SOLN
INTRAMUSCULAR | Status: AC
  Filled 2024-01-29: qty 2

## 2024-01-29 MED ORDER — FENTANYL CITRATE PF 50 MCG/ML IJ SOSY: 25.0000 ug | PREFILLED_SYRINGE | INTRAMUSCULAR | Status: DC | PRN

## 2024-01-29 MED ORDER — EPHEDRINE SULFATE (PRESSORS) 50 MG/ML IJ SOLN
INTRAMUSCULAR | Status: DC | PRN
Start: 2024-01-29 — End: 2024-01-29
  Administered 2024-01-29: 10 mg via INTRAVENOUS

## 2024-01-29 MED ORDER — EPHEDRINE 5 MG/ML INJ
INTRAVENOUS | Status: AC
  Filled 2024-01-29: qty 5

## 2024-01-29 SURGICAL SUPPLY — 44 items
ANCHOR 2.3 SP SGL 1.2 XBRAID (Anchor) IMPLANT
ANCHOR SUT 1.8 FIBERTAK SB KL (Anchor) IMPLANT
ANCHOR SWIVELOCK BIO 4.75X19.1 (Anchor) IMPLANT
BLADE SHAVER 4.5X7 STR FR (MISCELLANEOUS) ×1 IMPLANT
BUR BR 5.5 WIDE MOUTH (BURR) ×1 IMPLANT
CANNULA PART THRD DISP 5.75X7 (CANNULA) ×1 IMPLANT
CANNULA PARTIAL THREAD 2X7 (CANNULA) ×1 IMPLANT
CANNULA TWIST IN 8.25X7CM (CANNULA) IMPLANT
CHLORAPREP W/TINT 26 (MISCELLANEOUS) ×1 IMPLANT
COOLER POLAR GLACIER W/PUMP (MISCELLANEOUS) ×1 IMPLANT
COVER LIGHT HANDLE UNIVERSAL (MISCELLANEOUS) ×3 IMPLANT
CRADLE LAMINECT ARM (MISCELLANEOUS) ×1 IMPLANT
DERMABOND ADVANCED .7 DNX12 (GAUZE/BANDAGES/DRESSINGS) ×1 IMPLANT
DRAPE STERI 35X30 U-POUCH (DRAPES) IMPLANT
DRAPE U-SHAPE 48X52 POLY STRL (PACKS) ×1 IMPLANT
DRSG TEGADERM 4X4.75 (GAUZE/BANDAGES/DRESSINGS) ×3 IMPLANT
ELECTRODE REM PT RTRN 9FT ADLT (ELECTROSURGICAL) ×1 IMPLANT
GAUZE SPONGE 4X4 12PLY STRL (GAUZE/BANDAGES/DRESSINGS) ×1 IMPLANT
GAUZE XEROFORM 1X8 LF (GAUZE/BANDAGES/DRESSINGS) ×1 IMPLANT
GLOVE BIOGEL PI IND STRL 8 (GLOVE) ×2 IMPLANT
GLOVE SURG SS PI 7.5 STRL IVOR (GLOVE) ×3 IMPLANT
GLOVE SURG SYN 8.0 PF PI (GLOVE) ×1 IMPLANT
GOWN STRL REIN 2XL XLG LVL4 (GOWN DISPOSABLE) ×1 IMPLANT
GOWN STRL REUS W/ TWL LRG LVL3 (GOWN DISPOSABLE) ×3 IMPLANT
IV LR IRRIG 3000ML ARTHROMATIC (IV SOLUTION) ×4 IMPLANT
KIT STABILIZATION SHOULDER (MISCELLANEOUS) ×1 IMPLANT
KIT STR SPEAR 1.8 FBRTK DISP (KITS) IMPLANT
KIT TURNOVER KIT A (KITS) ×1 IMPLANT
MANIFOLD NEPTUNE II (INSTRUMENTS) ×1 IMPLANT
MASK FACE SPIDER DISP (MASK) ×1 IMPLANT
MAT ABSORB FLUID 56X50 GRAY (MISCELLANEOUS) ×2 IMPLANT
PACK ARTHROSCOPY SHOULDER (MISCELLANEOUS) ×1 IMPLANT
PAD ABD DERMACEA PRESS 5X9 (GAUZE/BANDAGES/DRESSINGS) ×2 IMPLANT
PAD WRAPON POLAR SHDR XLG (MISCELLANEOUS) ×1 IMPLANT
PASSER SUT FIRSTPASS SELF (INSTRUMENTS) IMPLANT
SET Y ADAPTER MULIT-BAG IRRIG (MISCELLANEOUS) ×2 IMPLANT
SPONGE T-LAP 18X18 ~~LOC~~+RFID (SPONGE) IMPLANT
SUT ETHILON 3-0 (SUTURE) ×1 IMPLANT
SUT PDS AB 0 CT1 27 (SUTURE) IMPLANT
SYSTEM IMPL TENODESIS LNT 2.9 (Orthopedic Implant) IMPLANT
TUBE SET DOUBLEFLO INFLOW (TUBING) ×1 IMPLANT
TUBING CONNECTING 10 (TUBING) ×1 IMPLANT
TUBING OUTFLOW SET DBLFO PUMP (TUBING) ×1 IMPLANT
WAND WEREWOLF FLOW 90D (MISCELLANEOUS) ×1 IMPLANT

## 2024-01-29 NOTE — Anesthesia Postprocedure Evaluation (Signed)
 Anesthesia Post Note  Patient: [name]  Procedure(s) Performed: ARTHROSCOPY, SHOULDER WITH REPAIR, ROTATOR CUFF (Right: Shoulder) DECOMPRESSION, SUBACROMIAL SPACE (Right: Shoulder) TENODESIS, BICEPS (Right: Shoulder)  Patient location during evaluation: PACU Anesthesia Type: Regional Level of consciousness: awake and alert, oriented and patient cooperative Pain management: pain level controlled Vital Signs Assessment: post-procedure vital signs reviewed and stable Respiratory status: spontaneous breathing, nonlabored ventilation and respiratory function stable Cardiovascular status: blood pressure returned to baseline and stable Postop Assessment: adequate PO intake Anesthetic complications: no   No notable events documented.   Last Vitals:  Vitals:   01/29/24 1300 01/29/24 1315  BP: 126/88 133/76  Pulse: 93 86  Resp: 13 11  Temp:  36.4 C  SpO2: 98% 98%    Last Pain:  Vitals:   01/29/24 1315  TempSrc:   PainSc: 0-No pain                 Alfonso Ruths

## 2024-01-29 NOTE — H&P (Signed)
 Paper H&P to be scanned into permanent record. H&P reviewed. No significant changes noted.

## 2024-01-29 NOTE — Transfer of Care (Signed)
 Immediate Anesthesia Transfer of Care Note  Patient: [name]  Procedure(s) Performed: ARTHROSCOPY, SHOULDER WITH REPAIR, ROTATOR CUFF (Right: Shoulder) DECOMPRESSION, SUBACROMIAL SPACE (Right: Shoulder) TENODESIS, BICEPS (Right: Shoulder)  Patient Location: PACU  Anesthesia Type: General, Regional  Level of Consciousness: awake, alert  and patient cooperative  Airway and Oxygen Therapy: Patient Spontanous Breathing and Patient connected to supplemental oxygen  Post-op Assessment: Post-op Vital signs reviewed, Patient's Cardiovascular Status Stable, Respiratory Function Stable, Patent Airway and No signs of Nausea or vomiting  Post-op Vital Signs: Reviewed and stable  Complications: No notable events documented.

## 2024-01-29 NOTE — Op Note (Signed)
 SURGERY DATE: 01/29/2024   PRE-OP DIAGNOSIS:  1. Right subacromial impingement 2. Right biceps tendinopathy 3. Right rotator cuff tear  POST-OP DIAGNOSIS: 1. Right subacromial impingement 2. Right biceps tendinopathy 3. Right rotator cuff tear  PROCEDURES:  1. Right arthroscopic rotator cuff repair (upper border subscapularis + high-grade partial thickness bursal sided supraspinatus) 2. Right arthroscopic biceps tenodesis 3. Right arthroscopic subacromial decompression 4. Right arthroscopic extensive debridement of shoulder (glenohumeral and subacromial spaces)   SURGEON: Earnestine HILARIO Blanch, MD   ASSISTANT: DOROTHA Krystal Doyne, PA   ANESTHESIA: Gen with Exparel  interscalene block   ESTIMATED BLOOD LOSS: 5cc   DRAINS:  none   TOTAL IV FLUIDS: per anesthesia      SPECIMENS: none   IMPLANTS:  - Arthrex 2.62mm PushLock x 1 - Arthrex 1.25mm Knotless Fibertak x 1 - Arthrex 4.63mm SwiveLock x 1 - Iconix SPEED double loaded with 1.2 and 2.0mm tape x 1     OPERATIVE FINDINGS:  Examination under anesthesia: A careful examination under anesthesia was performed.  Passive range of motion was: FF: 150; ER at side: 80; ER in abduction: 100; IR in abduction: 45.  Anterior load shift: NT.  Posterior load shift: NT.  Sulcus in neutral: NT.  Sulcus in ER: NT.     Intra-operative findings: A thorough arthroscopic examination of the shoulder was performed.  The findings are: 1. Biceps tendon: small, split-thickness tear and tendinopathy with erythema at the anchor  2. Superior labrum: Diminutive 3. Posterior labrum and capsule: normal 4. Inferior capsule and inferior recess: normal 5. Glenoid cartilage surface: Normal 6. Supraspinatus attachment: High-grade bursal sided tear of the anterior supraspinatus affecting approximately 90% of the footprint 7. Posterior rotator cuff attachment: normal 8. Humeral head articular cartilage: normal 9. Rotator interval: significant synovitis 10: Subscapularis  tendon: Small, partial-thickness upper border tear 11. Anterior labrum: Mildly degenerative 12. IGHL: normal   OPERATIVE REPORT:    Indications for procedure:  [name] is a 53 y.o. female with over 1 year of right shoulder pain that is worsened over the past 3 months.  She has had difficulty with overhead motion since that time with sensations of weakness. Clinical exam and MRI were suggestive of rotator cuff tear, biceps tendinopathy, and subacromial impingement. After discussion of risks, benefits, and alternatives to surgery, the patient elected to proceed.    Procedure in detail:   I identified [name] in the pre-operative holding area.  I marked the operative shoulder with my initials. I reviewed the risks and benefits of the proposed surgical intervention, and the patient wished to proceed.  Anesthesia was then performed with an Exparel  interscalene block.  The patient was transferred to the operative suite and placed in the beach chair position.     Appropriate IV antibiotics were administered prior to incision. The operative upper extremity was then prepped and draped in standard fashion. A time out was performed confirming the correct extremity, correct patient, and correct procedure.    I then created a standard posterior portal with an 11 blade. The glenohumeral joint was easily entered with a blunt trocar and the arthroscope introduced. The findings of diagnostic arthroscopy are described above. I debrided degenerative tissue including the synovitic tissue about the rotator interval and anterior and superior labrum. I then coagulated the inflamed synovium to obtain hemostasis and reduce the risk of post-operative swelling using an Arthrocare radiofrequency device.   I then turned my attention to the arthroscopic biceps tenodesis. The Loop n Tack technique  was used to pass a FiberTape through the biceps in a locked fashion adjacent to the biceps anchor.  A  hole for a 2.9 mm Arthrex PushLock was drilled in the bicipital groove just superior to the subscapularis tendon insertion.  The biceps tendon was then cut and the biceps anchor complex was debrided down to a stable base on the superior labrum.  The FiberTape was loaded onto the PushLock anchor and impacted into place into the previously drilled hole in the bicipital groove.  This appropriately secured the biceps into the bicipital groove and took it off of tension.   Next, arthroscopic repair of the subscapularis was performed. The lesser tuberosity footprint was prepared with a combination of electrocautery and an arthroscopic curette.  An Arthrex knotless FiberTak anchor was placed into the lesser tuberosity footprint from the anterior portal.  A BirdBeak was used to shuttle the repair suture through the upper border of the subscapularis tendon.  The suture was then shuttled through the anchor. With the arm in neutral rotation, the repair was tensioned appropriately. This appropriately reduced the subscapularis tear.  The arm was then internally and externally rotated and the subscapularis was noted to move appropriately with rotation.  The remainder of the suture was then cut.  Next, the arthroscope was then introduced into the subacromial space. A direct lateral portal was created with an 11-blade after spinal needle localization. An extensive subacromial bursectomy and debridement was performed using a combination of the shaver and Arthrocare wand. The entire acromial undersurface was exposed and the CA ligament was subperiosteally elevated to expose the anterior acromial hook. A burr was used to create a flat anterior and lateral aspect of the acromion, converting it from a Type 2 to a Type 1 acromion. Care was made to keep the deltoid fascia intact.  Next, I created an accessory posterolateral portal to assist with visualization and instrumentation.  I debrided the poor quality edges of the  supraspinatus tendon.  It took minimal effort to push a switching stick into the glenohumeral joint through the bursal side of the tear.  This was a U-shaped tear of the supraspinatus.  The footprint was then prepared appropriately.  I then percutaneously placed 1 Iconix SPEED medial row anchor at the articular margin. I then shuttled all 4 strands of tape through the rotator cuff just lateral to the musculotendinous junction using a FirstPass suture passer spanning the anterior to posterior extent of the tear. All 4 strands of suture were passed through an [employer] anchor.  This was placed approximately 2 cm distal to the lateral edge of the footprint in line with the midportion of the tear with appropriate tensioning of each suture prior to final fixation. There was 1 small dogear one anteriorly.  The knotless mechanism of the SwiveLock anchor was utilized to reduce the dogear. This construct allowed for excellent reapproximation of the rotator cuff to its native footprint without undue tension.  Appropriate compression was achieved. The repair was stable to external and internal rotation.   Fluid was evacuated from the shoulder, and the portals were closed with 3-0 Nylon. Xeroform was applied to the portals. A sterile dressing was applied, followed by a Polar Care sleeve and a SlingShot shoulder immobilizer/sling. The patient was awakened from anesthesia without difficulty and was transferred to the PACU in stable condition.    Of note, assistance from a PA was essential to performing the surgery.  PA was present for the entire surgery.  PA assisted with  patient positioning, retraction, instrumentation, and wound closure. The surgery would have been more difficult and had longer operative time without PA assistance.   COMPLICATIONS: none   DISPOSITION: plan for discharge home after recovery in PACU     POSTOPERATIVE PLAN: Remain in sling (except hygiene and elbow/wrist/hand RoM exercises as  instructed by PT) x 4 weeks and NWB for this time. PT to begin 3-4 days after surgery.  Small/medium rotator cuff repair rehab protocol. ASA 325mg  daily x 2 weeks for DVT ppx.

## 2024-01-29 NOTE — Progress Notes (Signed)
 Assisted Mazzoni ANMD with right, interscalene , ultrasound guided block. Side rails up, monitors on throughout procedure. See vital signs in flow sheet. Tolerated Procedure well.

## 2024-01-29 NOTE — Progress Notes (Signed)
 Referring Physician:  Sherial Bail, MD [address] [place],  [place] [address]  Primary Physician:  Sherial Bail, MD  History of Present Illness: 02/03/2024 [name] is here today with a chief complaint of bilateral shoulder pain.  This been going on for at least 6 months where she would get numbness tingling.  She noticed it worsening in July.  Had some difficulty with buttoning buttons and noticed that her grip was weakening.  She did get bilateral hand numbness all the way up to her neck.  She does note that the more hard work she does with her hands the worst a will get, but dexterity is not a major issue when she is rested.  She does feel like her fingers do not move correctly.  She does not notice any significant pertinent neck positions.  She has not had an EMG.  No neck pain.  No balance issues.  Conservative measures:  Physical therapy: has not participated in for neck, but she is currently doing PT for her shoulder Multimodal medical therapy including regular antiinflammatories: Tylenol , Meloxicam , Gabapentin  Injections: no epidural steroid injections  Past Surgery: no spine surgery  The symptoms are causing a significant impact on the patient's life.   I have utilized the care everywhere function in epic to review the outside records available from external health systems.  Review of Systems:  A 10 point review of systems is negative, except for the pertinent positives and negatives detailed in the HPI.  Past Medical History: Past Medical History:  Diagnosis Date   History of palpitations    Migraine    Mitral valve prolapse    Wears contact lenses     Past Surgical History: Past Surgical History:  Procedure Laterality Date   APPENDECTOMY     CESAREAN SECTION     ESOPHAGOGASTRODUODENOSCOPY (EGD) WITH PROPOFOL  N/A 04/07/2016   Procedure: ESOPHAGOGASTRODUODENOSCOPY (EGD) WITH PROPOFOL ;  Surgeon: Rogelia Copping, MD;  Location: [facility]  ENDOSCOPY;  Service: Endoscopy;  Laterality: N/A;   TONSILLECTOMY      Allergies: Allergies as of 02/03/2024   (No Known Allergies)    Medications: No current facility-administered medications for this visit.  Current Outpatient Medications:    acetaminophen  (TYLENOL ) 500 MG tablet, Take 2 tablets (1,000 mg total) by mouth every 8 (eight) hours., Disp: 90 tablet, Rfl: 2   aspirin  EC 325 MG tablet, Take 1 tablet (325 mg total) by mouth daily for 14 days., Disp: 14 tablet, Rfl: 0   ondansetron  (ZOFRAN -ODT) 4 MG disintegrating tablet, Take 1 tablet (4 mg total) by mouth every 8 (eight) hours as needed for nausea or vomiting., Disp: 20 tablet, Rfl: 0   oxyCODONE  (ROXICODONE ) 5 MG immediate release tablet, Take 1-2 tablets (5-10 mg total) by mouth every 4 (four) hours as needed (pain)., Disp: 30 tablet, Rfl: 0  Facility-Administered Medications Ordered in Other Visits:    acetaminophen  (OFIRMEV ) IV 1,000 mg, 1,000 mg, Intravenous, Once PRN, Mazzoni, Andrea, MD   bupivacaine  liposome (EXPAREL ) 1.3 % injection, , Infiltration, Anesthesia Intra-op, Mazzoni, Andrea, MD, 20 mL at 01/29/24 1040   bupivacaine (PF) (MARCAINE ) 0.5 % injection, , Infiltration, Anesthesia Intra-op, Mazzoni, Andrea, MD, 10 mL at 01/29/24 1035   dexamethasone  (DECADRON ) injection, , Intravenous, Anesthesia Intra-op, Adams, Wenjing, CRNA, 4 mg at 01/29/24 1117   ePHEDrine  injection, , Intravenous, Anesthesia Intra-op, Adams, Wenjing, CRNA, 10 mg at 01/29/24 1155   EPINEPHrine  1 mg/mL in lactated ringers  irrigation, , , PRN, Tobie Priest, MD, 4 mL at  01/29/24 1143   fentaNYL  (SUBLIMAZE ) injection 25-50 mcg, 25-50 mcg, Intravenous, Q5 min PRN, Mazzoni, Andrea, MD   lactated ringers  infusion, , Intravenous, Continuous, Mazzoni, Andrea, MD, New Bag at 01/29/24 1104   lactated ringers  infusion, , Intravenous, Continuous, Mazzoni, Andrea, MD   lactated ringers  irrigation solution, , , PRN, Tobie Priest, MD, 3,000 mL at 01/29/24  1237   midazolam  (VERSED ) injection 2 mg, 2 mg, Intravenous, PRN, Mazzoni, Andrea, MD, 2 mg at 01/29/24 1043   ondansetron  (ZOFRAN ) injection 4 mg, 4 mg, Intravenous, Once PRN, Mazzoni, Andrea, MD   ondansetron  (ZOFRAN ) injection, , Intravenous, Anesthesia Intra-op, Myra Lawless, CRNA, 4 mg at 01/29/24 1240   oxyCODONE  (Oxy IR/ROXICODONE ) immediate release tablet 5 mg, 5 mg, Oral, Once PRN **OR** oxyCODONE  (ROXICODONE ) 5 MG/5ML solution 5 mg, 5 mg, Oral, Once PRN, Mazzoni, Andrea, MD   phenylephrine  (NEO-SYNEPHRINE) injection, , Intravenous, Anesthesia Intra-op, Myra Lawless, CRNA, 80 mcg at 01/29/24 1215   propofol  (DIPRIVAN ) 10 mg/mL bolus/IV push, , Intravenous, Anesthesia Intra-op, Adams, Wenjing, CRNA, 150 mg at 01/29/24 1110   rocuronium  (ZEMURON ) injection, , Intravenous, Anesthesia Intra-op, Adams, Wenjing, CRNA, 40 mg at 01/29/24 1110  Social History: Social History   Tobacco Use   Smoking status: Never   Smokeless tobacco: Never  Vaping Use   Vaping status: Never Used  Substance Use Topics   Alcohol use: No   Drug use: No    Family Medical History: Family History  Problem Relation Age of Onset   Heart failure Father    Breast cancer Maternal Aunt        mother's half sister   Breast cancer Paternal Aunt     Physical Examination: There were no vitals filed for this visit.  General: Patient is in no apparent distress. Attention to examination is appropriate.  Neck:   Supple.  Full range of motion.  Respiratory: Patient is breathing without any difficulty.   NEUROLOGICAL:     Awake, alert, oriented to person, place, and time.  Speech is clear and fluent.   Strength: Right upper extremity examination limited due to recent surgery.  No major wasting noted in her left upper extremity  Imaging: Narrative & Impression  EXAM: MRI CERVICAL SPINE WITHOUT CONTRAST 12/29/2023 04:59:01 PM   TECHNIQUE: Multiplanar multisequence MRI of the cervical spine was  performed without the administration of intravenous contrast.   COMPARISON: None available.   CLINICAL HISTORY: Bilateral shoulder and arm pain with numbness and tingling in fingers. NKI No surg. Problems x 2 years but worse last 2 months.   FINDINGS:   BONES AND ALIGNMENT: Normal alignment. Normal vertebral body heights. Marrow signal is unremarkable. No abnormal enhancement.   SPINAL CORD: Normal spinal cord size. Normal spinal cord signal.   SOFT TISSUES: No paraspinal mass.   C2-C3: No significant disc herniation. No spinal canal stenosis or neural foraminal narrowing.   C3-C4: No significant disc herniation. No spinal canal stenosis or neural foraminal narrowing.   C4-C5: No significant disc herniation. No spinal canal stenosis or neural foraminal narrowing.   C5-C6: Broad based bulging disc osteophyte complex causing mild-to-moderate central spinal canal stenosis and mild-to-moderate left neural foraminal stenosis. No apparent spinal cord or nerve root impingement.   C6-C7: Mild diffuse disc bulging with mild central spinal canal stenosis and mild bilateral neural foraminal stenosis.   C7-T1: No significant disc herniation. No spinal canal stenosis or neural foraminal narrowing.   IMPRESSION: 1. Broad based bulging disc osteophyte complex at C5-6 causing mild-to-moderate central  spinal canal stenosis and mild-to-moderate left neural foraminal stenosis. No apparent spinal cord or nerve root impingement. 2. Mild diffuse disc bulging at C6-7 with mild central spinal canal stenosis and mild bilateral neural foraminal stenosis.   Electronically signed by: evalene coho 01/05/2024 08:37 AM EDT RP Workstation: [id number]     I have personally reviewed the images and agree with the above interpretation.  Medical Decision Making/Assessment and Plan: [name] is a pleasant 53 y.o. female with bilateral hand numbness and bilateral shoulder pain.   She has also recently had a shoulder procedure.  Her symptoms are not unclear for certain focused etiology.  She does have some intrinsic weakness that is mild in the tested hand, she does have some diffuse hyperreflexia.  She has a equivocal jaw jerk, we did discuss possibility of a neurology evaluation or EMG nerve conduction study evaluation.  Will plan on getting flexion-extension x-rays to evaluate for any instability which could be causing some cervical issues.  She does have at least moderate stenosis at C5-6 and more mild stenosis at C6-7.  If there was significant motion at C5-6 this could be worsened than it is on the MRI.  Penne MICAEL Sharps MD/MSCR Neurosurgery   Spent a total of 30 minutes on her care today.  This involved face-to-face evaluation, review of her imaging, coordination of her care going forward  Addendum: 03/01/2024.  Narrative & Impression  CLINICAL DATA:  Evaluate for excess motion   EXAM: CERVICAL SPINE - COMPLETE 4+ VIEW   COMPARISON:  CT cervical spine 04/14/2023   FINDINGS: There is no evidence of cervical spine fracture or prevertebral soft tissue swelling. There is 1.5 mm of anterolisthesis at C4-C5 with flexion. Alignment is anatomic with extension and neutral positioning. There is mild disc space narrowing and endplate osteophyte formation at C5-C6 and C6-C7.   IMPRESSION: 1. 1.5 mm of anterolisthesis at C4-C5 with flexion. 2. Mild degenerative disc disease at C5-C6 and C6-C7.     Electronically Signed   By: Greig Pique M.D.   On: 02/11/2024 20:07

## 2024-01-29 NOTE — Anesthesia Procedure Notes (Addendum)
 Procedure Name: Intubation Date/Time: 01/29/2024 11:14 AM  Performed by: Elly Pfeiffer, CRNAPre-anesthesia Checklist: Patient identified, Emergency Drugs available, Suction available and Patient being monitored Patient Re-evaluated:Patient Re-evaluated prior to induction Oxygen Delivery Method: Circle system utilized Preoxygenation: Pre-oxygenation with 100% oxygen Induction Type: IV induction Ventilation: Mask ventilation without difficulty Laryngoscope Size: Mac and 4 Grade View: Grade III Tube type: Oral Tube size: 7.0 mm Number of attempts: 1 Airway Equipment and Method: Stylet and Oral airway Placement Confirmation: ETT inserted through vocal cords under direct vision, positive ETCO2 and breath sounds checked- equal and bilateral Secured at: 21 cm Tube secured with: Tape Dental Injury: Teeth and Oropharynx as per pre-operative assessment  Difficulty Due To: Difficult Airway- due to anterior larynx Comments: Anterior; retrognathic; use McGrath in future. Easy mask airway. -CA, CRNA

## 2024-01-29 NOTE — Anesthesia Procedure Notes (Signed)
 Anesthesia Regional Block: Interscalene brachial plexus block   Pre-Anesthetic Checklist: , timeout performed,  Correct Patient, Correct Site, Correct Laterality,  Correct Procedure,, risks and benefits discussed,  Surgical consent,  Pre-op evaluation,  At surgeon's request and post-op pain management  Laterality: Right  Prep: chloraprep       Needles:  Injection technique: Single-shot  Needle Type: Echogenic Needle          Additional Needles:   Procedures:,,,, ultrasound used (permanent image in chart),,   Motor weakness within 20 minutes.  Narrative:  Start time: 01/29/2024 10:35 AM End time: 01/29/2024 10:40 AM Injection made incrementally with aspirations every 5 mL.  Performed by: Personally  Anesthesiologist: Shellie Odor, MD  Additional Notes: Functioning IV was confirmed and monitors applied.  Sterile prep and drape, hand hygiene and sterile gloves were used. Ultrasound guidance: relevant anatomy identified, needle position confirmed, local anesthetic spread visualized around nerve(s), vascular puncture avoided.  Image saved to electronic medical record.  Negative aspiration prior to incremental administration of local anesthetic for total 20 ml Exparel  and 10 ml bupivacaine  0.5% given in interscalene distribution. The patient tolerated the procedure well. Vital signs and moderate sedation medications recorded in RN notes.

## 2024-02-02 DIAGNOSIS — M25511 Pain in right shoulder: Secondary | ICD-10-CM | POA: Diagnosis not present

## 2024-02-02 DIAGNOSIS — G8929 Other chronic pain: Secondary | ICD-10-CM | POA: Diagnosis not present

## 2024-02-03 ENCOUNTER — Ambulatory Visit
Admission: RE | Admit: 2024-02-03 | Discharge: 2024-02-03 | Disposition: A | Discharge status: Home / Self Care | Source: Ambulatory Visit | Attending: Neurosurgery | Admitting: Neurosurgery

## 2024-02-03 ENCOUNTER — Ambulatory Visit (INDEPENDENT_AMBULATORY_CARE_PROVIDER_SITE_OTHER): Admitting: Neurosurgery

## 2024-02-03 DIAGNOSIS — M4802 Spinal stenosis, cervical region: Secondary | ICD-10-CM | POA: Diagnosis not present

## 2024-02-03 DIAGNOSIS — M50322 Other cervical disc degeneration at C5-C6 level: Secondary | ICD-10-CM | POA: Diagnosis not present

## 2024-02-03 DIAGNOSIS — M4712 Other spondylosis with myelopathy, cervical region: Secondary | ICD-10-CM

## 2024-02-03 DIAGNOSIS — M50323 Other cervical disc degeneration at C6-C7 level: Secondary | ICD-10-CM | POA: Diagnosis not present

## 2024-02-03 DIAGNOSIS — M4312 Spondylolisthesis, cervical region: Secondary | ICD-10-CM

## 2024-02-08 DIAGNOSIS — M25511 Pain in right shoulder: Secondary | ICD-10-CM | POA: Diagnosis not present

## 2024-02-08 DIAGNOSIS — G8929 Other chronic pain: Secondary | ICD-10-CM | POA: Diagnosis not present

## 2024-02-16 DIAGNOSIS — G8929 Other chronic pain: Secondary | ICD-10-CM | POA: Diagnosis not present

## 2024-02-16 DIAGNOSIS — M25511 Pain in right shoulder: Secondary | ICD-10-CM | POA: Diagnosis not present

## 2024-02-23 ENCOUNTER — Encounter: Payer: Self-pay | Admitting: Neurosurgery

## 2024-02-23 DIAGNOSIS — G8929 Other chronic pain: Secondary | ICD-10-CM | POA: Diagnosis not present

## 2024-02-23 DIAGNOSIS — M25511 Pain in right shoulder: Secondary | ICD-10-CM | POA: Diagnosis not present

## 2024-02-29 DIAGNOSIS — M25511 Pain in right shoulder: Secondary | ICD-10-CM | POA: Diagnosis not present

## 2024-02-29 DIAGNOSIS — G8929 Other chronic pain: Secondary | ICD-10-CM | POA: Diagnosis not present

## 2024-02-29 NOTE — Progress Notes (Signed)
 [facility]                    PT Daily Progress Note  Patient's Name:[name]                        MRN #[id number]      Date:02/29/2024  Visit Number: 5  Progress Related to Long and Short Term Goals:                                                                                            [] AROM:    [] PROM:     [] Strength:     [] Pain:     while- [] at rest    [] During activity:  []  Functional goal:    []  Other:    []  Balance:     Patient / Family Education:    []  Progressed HEP to include:  []  Reviewed previous HEP:    ___________________________________________________________________________________   Skilled Service Provided:  To: [x]  right   []  left     []  bilateral    []   core/body stability and balance  []  neck   [x]   shoulder   []  upper arm  []  elbow   []  lower arm  []  wrist   []  hand   []   fingers  []  back     []  hip    []  upper leg    []  knee  []  lower leg   []   ankle  foot            Ther Ex     Manual Tx Manual Therapy CPT 97140: 30 minutes   Vaso-pneumatic compression      Ice / Heat  Hot / Cold Modality  CPT 97010: Performed                     Infrared         US                 []  Inc ROM [x] Inc joint mob [] Dec swelling [] Dec pain Setting:   [] Inc Circ  []  Inc Flex [x]  Inc Flex [] Dec inflam [] Dec inflam [] Inc Healing [] Dec pain  []  Inc  Bal [x]  Inc ROM [] Dec edema [] Dec swelling [] Dec pain [] Dec swelling  []  Inc Strength [] Astym Level      []  1        []  2      []  3 [] Inc circulation [] Inc circulation [] Dec inflam  Progressive  HEP          []   [] tension/ spasms/soft tissue mob Temp:         36 F [] Inc soft tissue mobility    [] Inc Posture [] Correct malaign       Subjective:    The patient reports weaning out of the sling.  The patient attempted 1 night of sleep without the sling but the shoulder was more sore the next day.  As result, the patient resumed wearing the sling just for sleeping Treatment notes:    Manual treatment targeting ROM, mobility and  flexibility of the right shoulder complex.  Applied a cold pack to the right shoulder posttreatment.   Assessment of Treatment: Minimal guarding with manual treatment of the right shoulder.  The patient will utilize her sling as needed.  The patient is having an easier time with repetitive use of the right arm operating a computer mouse at work. Patient's Response to Treatment:  [] Dec pain  [] Inc pain    []  Inc flexibility    [] Inc endurance   []  Inc strength     []  Inc AROM      []  Inc PROM       []  Dec Spasms    [] Inc Posture / alignment     []  Inc Joint Mobility   []  Other:   Functional Improvement Noted:  Increased ability to  []  walk   []  stand for longer periods   []  dress with less help    []  lift    []   reach   []   bend  []  Other:  Remaining Impairment Requiring Continued Treatment:   [x]  Dec  flexibility   [x]  Dec  ROM   [x]   pain   [x]  Dec strength  [x]   weakness    []   Dec balance   [x]   swelling   [x]   inflammation   []   Spasm    []  posture/alignment    []   Other:  Plan: [x]   Continue Plan of Care        []   Add:         []   Discharge after next visit    []   Discharge to HEP   []  Other: UE Treatment Log    Date  02/08/24 9/16 9/23 9/29        UBE            wall walk            Pulley            Flexion            Scaption            Abduction            T-Band IR            T-Band ER at 0 abd            Scap Retraction            Shoulder Extension            Lat Pulls            F. M.  Rows            Tricep Ext            Bicep Curls            Shoulder Shrugs            wall pushups            S/L ER            Horiz Abd @ 90 prone/stand            Horiz Abd @ 120 prone/stand            Bentover Extension            Bentover Rows            Serratus Punch Outs            Serratus Bear Hugs  PNFD2 Flex/Ext                                                                        HEP:  flexion table slides  and cane assisted ER  10'          Manual-  right shld PROM all planes, GH post & inf glides, scapular rot, protraction & retraction  30' 30' 30' 30'        Ultrasound/ Infrared            Cold pack to right shoulder  10' 10' 10'          PT Billing Documentation Date of Onset: 01/29/24 Visit Number: 5       Frequently Used Timed Codes Manual Therapy CPT 97140: 30 minutes        Hot / Cold Modality  CPT 97010: Performed        Total Treatment Time: 40 minutes   This note was generated in part with voice recognition software and I apologize for any typographical errors that were not detected and corrected. If the patient does not return for follow up visit(s) related to this episode of care, this note will serve as their discharge note from physical therapy.    Therapist's : Adine JULIANNA Sous, PT

## 2024-03-01 ENCOUNTER — Ambulatory Visit: Payer: Self-pay | Admitting: Neurosurgery

## 2024-03-08 DIAGNOSIS — M25511 Pain in right shoulder: Secondary | ICD-10-CM | POA: Diagnosis not present

## 2024-03-08 DIAGNOSIS — G8929 Other chronic pain: Secondary | ICD-10-CM | POA: Diagnosis not present

## 2024-03-11 DIAGNOSIS — G8929 Other chronic pain: Secondary | ICD-10-CM | POA: Diagnosis not present

## 2024-03-11 DIAGNOSIS — M25511 Pain in right shoulder: Secondary | ICD-10-CM | POA: Diagnosis not present

## 2024-03-21 DIAGNOSIS — G8929 Other chronic pain: Secondary | ICD-10-CM | POA: Diagnosis not present

## 2024-03-21 DIAGNOSIS — M25511 Pain in right shoulder: Secondary | ICD-10-CM | POA: Diagnosis not present

## 2024-03-22 DIAGNOSIS — R609 Edema, unspecified: Secondary | ICD-10-CM | POA: Insufficient documentation

## 2024-03-22 DIAGNOSIS — M7989 Other specified soft tissue disorders: Secondary | ICD-10-CM | POA: Insufficient documentation

## 2024-03-22 NOTE — Progress Notes (Unsigned)
 Cardiology Office Note  Date:  03/25/2024   ID:  R[place]leigh, Santore May 23, 1971, MRN [id number]  PCP:  Sherial Bail, MD   Chief Complaint  Patient presents with   New Patient (Initial Visit)    Self referral for evaluation of bilateral LE edema & mild shortness of breath with walking up an incline.     HPI:  [name] is a 53 y.o. female with past medical history of: Past Medical History:  Diagnosis Date   History of palpitations    Migraine    Mitral valve prolapse   Who presents for evaluation of ankle edema, heart murmur  Seen previously by myself March 2016 for chest pain Low-grade murmur appreciated at that time  Prior records reviewed Echo March 2016 with normal ejection fraction Very mild aortic valve stenosis, mild calcification, gradient 14 mmHg Mild MR  She reports having slight increase in ankle swelling Notable even when she wakes up, does not seem to get worse when she is sitting for long periods of time or standing No significant change in weight  Also reports having some shortness of breath walking up hills/stairs 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   PSH:    Past Surgical History:  Procedure Laterality Date   APPENDECTOMY     BICEPT TENODESIS Right 01/29/2024   Procedure: TENODESIS, BICEPS;  Surgeon: Tobie Priest, MD;  Location: [facility];  Service: Orthopedics;  Laterality: Right;  Right shoulder  arthroscopic rotator cuff repair, biceps tenodesis, distal clavicle excision, subacromial decompression   CESAREAN SECTION     ESOPHAGOGASTRODUODENOSCOPY (EGD) WITH PROPOFOL  N/A 04/07/2016   Procedure: ESOPHAGOGASTRODUODENOSCOPY (EGD) WITH PROPOFOL ;  Surgeon: Rogelia Copping, MD;  Location: [facility] END[facility];  Service: Endoscopy;  Laterality: N/A;   SHOULDER ARTHROSCOPY WITH OPEN ROTATOR CUFF REPAIR Right 01/29/2024   Procedure: ARTHROSCOPY, SHOULDER WITH REPAIR, ROTATOR CUFF;  Surgeon: Tobie Priest, MD;  Location: [facility];  Service: Orthopedics;  Laterality: Right;  Right shoulder arthroscopic rotator cuff repair, biceps tenodesis, distal clavicle excision, subacromial decompression   SUBACROMIAL DECOMPRESSION Right 01/29/2024   Procedure: DECOMPRESSION, SUBACROMIAL SPACE;  Surgeon: Tobie Priest, MD;  Location: [facility];  Service: Orthopedics;  Laterality: Right;  Right shoulder arthroscopic rotator cuff repair, biceps tenodesis, distal clavicle excision, subacromial decompression   TONSILLECTOMY      Current Outpatient Medications  Medication Sig Dispense Refill   acetaminophen  (TYLENOL ) 325 MG tablet Take 650 mg by mouth every 6 (six) hours as needed for mild pain or headache.     acetaminophen  (TYLENOL ) 500 MG tablet Take 2 tablets (1,000 mg total) by mouth every 8 (eight) hours. 90 tablet 2   benzonatate  (TESSALON ) 100 MG capsule Take 1 capsule (100 mg total) by mouth 3 (three) times daily as needed for cough. 30 capsule 0   Cholecalciferol (D3 5000 PO) Take by mouth.     cyanocobalamin  (VITAMIN B12) 1000 MCG tablet Take 1,000 mcg by mouth daily.  erythromycin  ophthalmic ointment Place 1 application into both eyes nightly for 7 days. 3.5 g 0   gatifloxacin  (ZYMAXID ) 0.5 % SOLN Place 1 drop into both eyes every hour today, then four times daily for 5 days. 2.5 mL 1   Multiple Vitamins tablet Take 1 tablet by mouth daily.      ondansetron  (ZOFRAN -ODT) 4 MG disintegrating  tablet Dissolve 1 tablet (4 mg total) in the mouth every 8 (eight) hours as needed for nausea or vomiting. 20 tablet 0   oxyCODONE  (ROXICODONE ) 5 MG immediate release tablet Take 1-2 tablets (5-10 mg total) by mouth every 4 (four) hours as needed (pain). 30 tablet 0   rizatriptan  (MAXALT ) 10 MG tablet Take 10 mg by mouth as needed for migraine. May repeat in 2 hours if needed     No current facility-administered medications for this visit.    Allergies:   Patient has no known allergies.   Social History:  The patient  reports that she has never smoked. She has never used smokeless tobacco. She reports that she does not drink alcohol and does not use drugs.   Family History:   family history includes Breast cancer in her maternal aunt and paternal aunt; Heart failure in her father.    Review of Systems: Review of Systems  Constitutional: Negative.   HENT: Negative.    Respiratory: Negative.    Cardiovascular: Negative.   Gastrointestinal: Negative.   Musculoskeletal: Negative.   Neurological: Negative.   Psychiatric/Behavioral: Negative.    All other systems reviewed and are negative.   PHYSICAL EXAM: VS:  BP (!) 122/90 (BP Location: Right Arm, Patient Position: Sitting, Cuff Size: Normal)   Pulse 81   Ht 5' 2 (1.575 m)   Wt 143 lb 6 oz (65 kg)   LMP 10/21/2016   SpO2 98%   BMI 26.22 kg/m  , BMI Body mass index is 26.22 kg/m. GEN: Well nourished, well developed, in no acute distress HEENT: normal Neck: no JVD, carotid bruits, or masses Cardiac: RRR; no murmurs, rubs, or gallops,no edema  Respiratory:  clear to auscultation bilaterally, normal work of breathing GI: soft, nontender, nondistended, + BS MS: no deformity or atrophy Skin: warm and dry, no rash Neuro:  Strength and sensation are intact Psych: euthymic mood, full affect   Recent Labs: No results found for requested labs within last 365 days.    Lipid Panel No results found for: CHOL, HDL, LDLCALC,  TRIG    Wt Readings from Last 3 Encounters:  03/25/24 143 lb 6 oz (65 kg)  01/29/24 142 lb 9.6 oz (64.7 kg)  11/15/18 127 lb (57.6 kg)      ASSESSMENT AND PLAN:  Problem List Items Addressed This Visit     Leg swelling - Primary   Relevant Orders   EKG 12-Lead (Completed)   Murmur   Relevant Orders   EKG 12-Lead (Completed)   Murmur Appreciated 2016 Increased in intensity on today's exam, repeat echocardiogram ordered We will focus on mitral and aortic valve Concern for progression of aortic valve disease given radiation to carotids on auscultation  Ankle swelling Minimal on today's visit, she has noticed slight increase in swelling when she wakes up -Will hold off on adding diuretics, echocardiogram pending - No significant change in weight  Shortness of breath Notable on hills and stairs Active at baseline Less likely ischemia CT scan abdomen pelvis pulled up with no aortic atherosclerosis Less likely underlying coronary disease Few risk factors 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 [facility] [place], [place] [phone]

## 2024-03-22 NOTE — H&P (View-Only) (Signed)
 Cardiology Office Note  Date:  03/25/2024   ID:  Leslie, Mack [birth year], MRN [id number]  PCP:  Sherial Bail, MD   Chief Complaint  Patient presents with   New Patient (Initial Visit)    Self referral for evaluation of bilateral LE edema & mild shortness of breath with walking up an incline.     HPI:  [name] is a 53 y.o. female with past medical history of: Past Medical History:  Diagnosis Date   History of palpitations    Migraine    Mitral valve prolapse   Who presents for evaluation of ankle edema, heart murmur  Seen previously by myself March 2016 for chest pain Low-grade murmur appreciated at that time  Prior records reviewed Echo March 2016 with normal ejection fraction Very mild aortic valve stenosis, mild calcification, gradient 14 mmHg Mild MR  She reports having slight increase in ankle swelling Notable even when she wakes up, does not seem to get worse when she is sitting for long periods of time or standing No significant change in weight  Also reports having some shortness of breath walking up hills/stairs 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   PSH:    Past Surgical History:  Procedure Laterality Date   APPENDECTOMY     BICEPT TENODESIS Right 01/29/2024   Procedure: TENODESIS, BICEPS;  Surgeon: Tobie Priest, MD;  Location: [facility];  Service: Orthopedics;  Laterality: Right;  Right shoulder  arthroscopic rotator cuff repair, biceps tenodesis, distal clavicle excision, subacromial decompression   CESAREAN SECTION     ESOPHAGOGASTRODUODENOSCOPY (EGD) WITH PROPOFOL  N/A 04/07/2016   Procedure: ESOPHAGOGASTRODUODENOSCOPY (EGD) WITH PROPOFOL ;  Surgeon: Rogelia Copping, MD;  Location: [facility];  Service: Endoscopy;  Laterality: N/A;   SHOULDER ARTHROSCOPY WITH OPEN ROTATOR CUFF REPAIR Right 01/29/2024   Procedure: ARTHROSCOPY, SHOULDER WITH REPAIR, ROTATOR CUFF;  Surgeon: Tobie Priest, MD;  Location: [facility];  Service: Orthopedics;  Laterality: Right;  Right shoulder arthroscopic rotator cuff repair, biceps tenodesis, distal clavicle excision, subacromial decompression   SUBACROMIAL DECOMPRESSION Right 01/29/2024   Procedure: DECOMPRESSION, SUBACROMIAL SPACE;  Surgeon: Tobie Priest, MD;  Location: [facility];  Service: Orthopedics;  Laterality: Right;  Right shoulder arthroscopic rotator cuff repair, biceps tenodesis, distal clavicle excision, subacromial decompression   TONSILLECTOMY      Current Outpatient Medications  Medication Sig Dispense Refill   acetaminophen  (TYLENOL ) 325 MG tablet Take 650 mg by mouth every 6 (six) hours as needed for mild pain or headache.     acetaminophen  (TYLENOL ) 500 MG tablet Take 2 tablets (1,000 mg total) by mouth every 8 (eight) hours. 90 tablet 2   benzonatate  (TESSALON ) 100 MG capsule Take 1 capsule (100 mg total) by mouth 3 (three) times daily as needed for cough. 30 capsule 0   Cholecalciferol (D3 5000 PO) Take by mouth.     cyanocobalamin  (VITAMIN B12) 1000 MCG tablet Take 1,000 mcg by mouth daily.  erythromycin  ophthalmic ointment Place 1 application into both eyes nightly for 7 days. 3.5 g 0   gatifloxacin  (ZYMAXID ) 0.5 % SOLN Place 1 drop into both eyes every hour today, then four times daily for 5 days. 2.5 mL 1   Multiple Vitamins tablet Take 1 tablet by mouth daily.      ondansetron  (ZOFRAN -ODT) 4 MG disintegrating  tablet Dissolve 1 tablet (4 mg total) in the mouth every 8 (eight) hours as needed for nausea or vomiting. 20 tablet 0   oxyCODONE  (ROXICODONE ) 5 MG immediate release tablet Take 1-2 tablets (5-10 mg total) by mouth every 4 (four) hours as needed (pain). 30 tablet 0   rizatriptan  (MAXALT ) 10 MG tablet Take 10 mg by mouth as needed for migraine. May repeat in 2 hours if needed     No current facility-administered medications for this visit.    Allergies:   Patient has no known allergies.   Social History:  The patient  reports that she has never smoked. She has never used smokeless tobacco. She reports that she does not drink alcohol and does not use drugs.   Family History:   family history includes Breast cancer in her maternal aunt and paternal aunt; Heart failure in her father.    Review of Systems: Review of Systems  Constitutional: Negative.   HENT: Negative.    Respiratory: Negative.    Cardiovascular: Negative.   Gastrointestinal: Negative.   Musculoskeletal: Negative.   Neurological: Negative.   Psychiatric/Behavioral: Negative.    All other systems reviewed and are negative.   PHYSICAL EXAM: VS:  BP (!) 122/90 (BP Location: Right Arm, Patient Position: Sitting, Cuff Size: Normal)   Pulse 81   Ht 5' 2 (1.575 m)   Wt 143 lb 6 oz (65 kg)   LMP 10/21/2016   SpO2 98%   BMI 26.22 kg/m  , BMI Body mass index is 26.22 kg/m. GEN: Well nourished, well developed, in no acute distress HEENT: normal Neck: no JVD, carotid bruits, or masses Cardiac: RRR; no murmurs, rubs, or gallops,no edema  Respiratory:  clear to auscultation bilaterally, normal work of breathing GI: soft, nontender, nondistended, + BS MS: no deformity or atrophy Skin: warm and dry, no rash Neuro:  Strength and sensation are intact Psych: euthymic mood, full affect   Recent Labs: No results found for requested labs within last 365 days.    Lipid Panel No results found for: CHOL, HDL, LDLCALC,  TRIG    Wt Readings from Last 3 Encounters:  03/25/24 143 lb 6 oz (65 kg)  01/29/24 142 lb 9.6 oz (64.7 kg)  11/15/18 127 lb (57.6 kg)      ASSESSMENT AND PLAN:  Problem List Items Addressed This Visit     Leg swelling - Primary   Relevant Orders   EKG 12-Lead (Completed)   Murmur   Relevant Orders   EKG 12-Lead (Completed)   Murmur Appreciated 2016 Increased in intensity on today's exam, repeat echocardiogram ordered We will focus on mitral and aortic valve Concern for progression of aortic valve disease given radiation to carotids on auscultation  Ankle swelling Minimal on today's visit, she has noticed slight increase in swelling when she wakes up -Will hold off on adding diuretics, echocardiogram pending - No significant change in weight  Shortness of breath Notable on hills and stairs Active at baseline Less likely ischemia CT scan abdomen pelvis pulled up with no aortic atherosclerosis Less likely underlying coronary disease Few risk factors 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 [facility] [place], [place] [phone]

## 2024-03-25 ENCOUNTER — Ambulatory Visit: Attending: Cardiovascular Disease | Admitting: Cardiovascular Disease

## 2024-03-25 ENCOUNTER — Encounter: Payer: Self-pay | Admitting: Cardiovascular Disease

## 2024-03-25 VITALS — BP 122/90 | HR 81 | Ht 62.0 in | Wt 143.4 lb

## 2024-03-25 DIAGNOSIS — M25511 Pain in right shoulder: Secondary | ICD-10-CM | POA: Diagnosis not present

## 2024-03-25 DIAGNOSIS — M7989 Other specified soft tissue disorders: Secondary | ICD-10-CM | POA: Diagnosis not present

## 2024-03-25 DIAGNOSIS — R011 Cardiac murmur, unspecified: Secondary | ICD-10-CM | POA: Diagnosis not present

## 2024-03-25 DIAGNOSIS — G8929 Other chronic pain: Secondary | ICD-10-CM | POA: Diagnosis not present

## 2024-03-25 NOTE — Patient Instructions (Addendum)

## 2024-03-28 ENCOUNTER — Telehealth: Payer: Self-pay

## 2024-03-28 ENCOUNTER — Ambulatory Visit: Attending: Cardiovascular Disease

## 2024-03-28 DIAGNOSIS — R011 Cardiac murmur, unspecified: Secondary | ICD-10-CM | POA: Diagnosis not present

## 2024-03-28 DIAGNOSIS — M7989 Other specified soft tissue disorders: Secondary | ICD-10-CM

## 2024-03-28 DIAGNOSIS — I350 Nonrheumatic aortic (valve) stenosis: Secondary | ICD-10-CM

## 2024-03-28 LAB — ECHOCARDIOGRAM COMPLETE
AV Mean grad: 39 mmHg
AV Peak grad: 64 mmHg
Ao pk vel: 4 m/s
Area-P 1/2: 4.21 cm2
S' Lateral: 2.86 cm

## 2024-03-28 NOTE — Telephone Encounter (Signed)
 Patient was seen in the office today for an echocardiogram and was found to have severe aortic stenosis. Per Dr. Gollan, the patient was recommended to undergo right and left heart catheterization, and a referral was placed to Dr. Lucas for aortic valve replacement evaluation.  The referral has been placed, and the cardiac catheterization is scheduled for Thursday, 03/31/24, with Dr. Anner.  [facility] A DEPT OF [place]. [facility] AT [facility] [address] [place] [address] Dept: [phone] Loc: [phone]   KYNA BLAHNIK                      03/28/2024   You are scheduled for a Cardiac Catheterization on Thursday, October 30 with Dr. Alm Anner.   1. Please arrive at the [facility] Entrance of [facility], [address], [place] [address] at 7:30 AM (This is 1 hour(s) prior to your procedure time).  Proceed to the Check-In Desk directly inside the entrance.   Procedure Parking: Use the entrance off of the [address] side of the hospital. Turn right upon entering and follow the driveway to parking that is directly in front of the Heart [facility] Vascular Center. There is no valet parking available at this entrance, however there is an awning directly in front of the Heart & Vascular Center for drop off/ pick up for patients.   Special note: Every effort is made to have your procedure done on time. Please understand that emergencies sometimes delay scheduled procedures.   2. Diet: Nothing to eat after midnight.     3. Hydration: You need to be well hydrated before your procedure. On October 30, you may drink approved liquids (see below) until 2 hours before the procedure, with 16 oz of water as your last intake.    List of approved liquids water, clear juice, clear tea, black coffee, fruit juices, non-citric and without pulp, carbonated beverages, Gatorade, Kool -Aid, plain Jello-O and  plain ice popsicles.   4. Labs: You will need to have blood drawn today CBC, BMP   5. Medication instructions in preparation for your procedure:    Contrast Allergy: No     On the morning of your procedure, take your Aspirin  81 mg and any morning medicines NOT listed above.  You may use sips of water.   6. Plan to go home the same day, you will only stay overnight if medically necessary. 7. Bring a current list of your medications and current insurance cards. 8. You MUST have a responsible person to drive you home. 9. Someone MUST be with you the first 24 hours after you arrive home or your discharge will be delayed. 10. Please wear clothes that are easy to get on and off and wear slip-on shoes.   Thank you for allowing us  to care for you!   -- [facility]   [facility] at [facility], [phone]                          1

## 2024-03-28 NOTE — Telephone Encounter (Signed)
 Pt is calling to r/s her Cath Procedure

## 2024-03-28 NOTE — Telephone Encounter (Signed)
 Spoke with the patient, who stated that she would like to reschedule her cardiac catheterization for next week. The patient was informed that the cath scheduler has already left for the day and that the nurse will contact her tomorrow to assist with rescheduling. The patient verbalized understanding.

## 2024-03-29 DIAGNOSIS — M25511 Pain in right shoulder: Secondary | ICD-10-CM | POA: Diagnosis not present

## 2024-03-29 DIAGNOSIS — G8929 Other chronic pain: Secondary | ICD-10-CM | POA: Diagnosis not present

## 2024-03-29 LAB — BASIC METABOLIC PANEL WITH GFR
BUN/Creatinine Ratio: 28 — ABNORMAL HIGH (ref 9–23)
BUN: 18 mg/dL (ref 6–24)
CO2: 26 mmol/L (ref 20–29)
Calcium: 10.4 mg/dL — ABNORMAL HIGH (ref 8.7–10.2)
Chloride: 100 mmol/L (ref 96–106)
Creatinine, Ser: 0.65 mg/dL (ref 0.57–1.00)
Glucose: 91 mg/dL (ref 70–99)
Potassium: 4.6 mmol/L (ref 3.5–5.2)
Sodium: 139 mmol/L (ref 134–144)
eGFR: 105 mL/min/1.73 (ref >59)

## 2024-03-29 LAB — CBC
Hematocrit: 42.7 % (ref 34.0–46.6)
Hemoglobin: 14.3 g/dL (ref 11.1–15.9)
MCH: 29.2 pg (ref 26.6–33.0)
MCHC: 33.5 g/dL (ref 31.5–35.7)
MCV: 87 fL (ref 79–97)
Platelets: 420 x10E3/uL (ref 150–450)
RBC: 4.89 x10E6/uL (ref 3.77–5.28)
RDW: 12.6 % (ref 11.7–15.4)
WBC: 9.6 x10E3/uL (ref 3.4–10.8)

## 2024-03-29 NOTE — Telephone Encounter (Signed)
 At pt's request, cath rescheduled for Monday 04/04/24 with Dr. Darron at 9:30 am. Pt aware to arrive by 8:30 am.

## 2024-03-31 DIAGNOSIS — G8929 Other chronic pain: Secondary | ICD-10-CM | POA: Diagnosis not present

## 2024-03-31 DIAGNOSIS — M25511 Pain in right shoulder: Secondary | ICD-10-CM | POA: Diagnosis not present

## 2024-03-31 DIAGNOSIS — I350 Nonrheumatic aortic (valve) stenosis: Secondary | ICD-10-CM

## 2024-04-02 ENCOUNTER — Ambulatory Visit: Payer: Self-pay | Admitting: Cardiovascular Disease

## 2024-04-03 ENCOUNTER — Other Ambulatory Visit: Payer: Self-pay | Admitting: Cardiovascular Disease

## 2024-04-03 DIAGNOSIS — I350 Nonrheumatic aortic (valve) stenosis: Secondary | ICD-10-CM

## 2024-04-04 ENCOUNTER — Encounter
Admission: RE | Disposition: A | Discharge status: Home / Self Care | Payer: Self-pay | Source: Home / Self Care | Attending: Cardiovascular Disease

## 2024-04-04 ENCOUNTER — Encounter: Payer: Self-pay | Admitting: Cardiovascular Disease

## 2024-04-04 ENCOUNTER — Ambulatory Visit
Admission: RE | Admit: 2024-04-04 | Discharge: 2024-04-04 | Disposition: A | Discharge status: Home / Self Care | Attending: Cardiovascular Disease | Admitting: Cardiovascular Disease

## 2024-04-04 ENCOUNTER — Other Ambulatory Visit: Payer: Self-pay

## 2024-04-04 DIAGNOSIS — I350 Nonrheumatic aortic (valve) stenosis: Secondary | ICD-10-CM | POA: Diagnosis not present

## 2024-04-04 DIAGNOSIS — R0602 Shortness of breath: Secondary | ICD-10-CM | POA: Diagnosis not present

## 2024-04-04 DIAGNOSIS — M7989 Other specified soft tissue disorders: Secondary | ICD-10-CM | POA: Insufficient documentation

## 2024-04-04 HISTORY — PX: RIGHT HEART CATH AND CORONARY ANGIOGRAPHY: CATH118264

## 2024-04-04 LAB — POCT I-STAT 7, (LYTES, BLD GAS, ICA,H+H)
Acid-Base Excess: 0 mmol/L (ref 0.0–2.0)
Bicarbonate: 25.3 mmol/L (ref 20.0–28.0)
Calcium, Ion: 1.23 mmol/L (ref 1.15–1.40)
HCT: 36 % (ref 36.0–46.0)
Hemoglobin: 12.2 g/dL (ref 12.0–15.0)
O2 Saturation: 96 %
Potassium: 3.6 mmol/L (ref 3.5–5.1)
Sodium: 141 mmol/L (ref 135–145)
TCO2: 27 mmol/L (ref 22–32)
pCO2 arterial: 41.8 mmHg (ref 32–48)
pH, Arterial: 7.391 (ref 7.35–7.45)
pO2, Arterial: 83 mmHg (ref 83–108)

## 2024-04-04 LAB — POCT I-STAT EG7
Acid-Base Excess: 1 mmol/L (ref 0.0–2.0)
Bicarbonate: 27 mmol/L (ref 20.0–28.0)
Calcium, Ion: 1.27 mmol/L (ref 1.15–1.40)
HCT: 36 % (ref 36.0–46.0)
Hemoglobin: 12.2 g/dL (ref 12.0–15.0)
O2 Saturation: 71 %
Potassium: 3.6 mmol/L (ref 3.5–5.1)
Sodium: 141 mmol/L (ref 135–145)
TCO2: 28 mmol/L (ref 22–32)
pCO2, Ven: 46.4 mmHg (ref 44–60)
pH, Ven: 7.373 (ref 7.25–7.43)
pO2, Ven: 39 mmHg (ref 32–45)

## 2024-04-04 SURGERY — RIGHT/LEFT HEART CATH AND CORONARY ANGIOGRAPHY
Anesthesia: Moderate Sedation

## 2024-04-04 SURGERY — RIGHT HEART CATH AND CORONARY ANGIOGRAPHY
Anesthesia: Moderate Sedation | Laterality: Bilateral

## 2024-04-04 MED ORDER — ACETAMINOPHEN 325 MG PO TABS: 650.0000 mg | ORAL_TABLET | ORAL | Status: DC | PRN

## 2024-04-04 MED ORDER — SODIUM CHLORIDE 0.9% FLUSH: 3.0000 mL | INTRAVENOUS | Status: DC | PRN

## 2024-04-04 MED ORDER — HEPARIN (PORCINE) IN NACL 1000-0.9 UT/500ML-% IV SOLN
INTRAVENOUS | Status: DC | PRN
Start: 2024-04-04 — End: 2024-04-04
  Administered 2024-04-04: 1000 mL

## 2024-04-04 MED ORDER — ASPIRIN 81 MG PO CHEW: 81.0000 mg | CHEWABLE_TABLET | ORAL | Status: DC

## 2024-04-04 MED ORDER — FREE WATER
500.0000 mL | Freq: Once | Status: AC
  Administered 2024-04-04: 500 mL via ORAL

## 2024-04-04 MED ORDER — SODIUM CHLORIDE 0.9% FLUSH: 3.0000 mL | Freq: Two times a day (BID) | INTRAVENOUS | Status: DC

## 2024-04-04 MED ORDER — FENTANYL CITRATE (PF) 100 MCG/2ML IJ SOLN
INTRAMUSCULAR | Status: AC
  Filled 2024-04-04: qty 2

## 2024-04-04 MED ORDER — VERAPAMIL HCL 2.5 MG/ML IV SOLN
INTRAVENOUS | Status: DC | PRN
  Administered 2024-04-04: 2.5 mg via INTRA_ARTERIAL

## 2024-04-04 MED ORDER — MIDAZOLAM HCL (PF) 2 MG/2ML IJ SOLN
INTRAMUSCULAR | Status: DC | PRN
  Administered 2024-04-04: 1 mg via INTRAVENOUS

## 2024-04-04 MED ORDER — HEPARIN SODIUM (PORCINE) 1000 UNIT/ML IJ SOLN
INTRAMUSCULAR | Status: DC | PRN
  Administered 2024-04-04: 3000 [IU] via INTRAVENOUS

## 2024-04-04 MED ORDER — SODIUM CHLORIDE 0.9 % IV SOLN
250.0000 mL | INTRAVENOUS | Status: DC | PRN
Start: 2024-04-04 — End: 2024-04-04

## 2024-04-04 MED ORDER — IOHEXOL 300 MG/ML  SOLN
INTRAMUSCULAR | Status: DC | PRN
  Administered 2024-04-04: 45 mL

## 2024-04-04 MED ORDER — HEPARIN SODIUM (PORCINE) 1000 UNIT/ML IJ SOLN
INTRAMUSCULAR | Status: AC
  Filled 2024-04-04: qty 10

## 2024-04-04 MED ORDER — SODIUM CHLORIDE 0.9 % IV SOLN
250.0000 mL | INTRAVENOUS | Status: DC | PRN
  Administered 2024-04-04: 250 mL via INTRAVENOUS

## 2024-04-04 MED ORDER — LIDOCAINE HCL 1 % IJ SOLN
INTRAMUSCULAR | Status: AC
  Filled 2024-04-04: qty 20

## 2024-04-04 MED ORDER — ONDANSETRON HCL 4 MG/2ML IJ SOLN: 4.0000 mg | Freq: Four times a day (QID) | INTRAMUSCULAR | Status: DC | PRN

## 2024-04-04 MED ORDER — MIDAZOLAM HCL 2 MG/2ML IJ SOLN
INTRAMUSCULAR | Status: AC
  Filled 2024-04-04: qty 2

## 2024-04-04 MED ORDER — LIDOCAINE HCL (PF) 1 % IJ SOLN
INTRAMUSCULAR | Status: DC | PRN
Start: 2024-04-04 — End: 2024-04-04
  Administered 2024-04-04 (×2): 2 mL

## 2024-04-04 MED ORDER — VERAPAMIL HCL 2.5 MG/ML IV SOLN
INTRAVENOUS | Status: AC
  Filled 2024-04-04: qty 2

## 2024-04-04 MED ORDER — FENTANYL CITRATE (PF) 100 MCG/2ML IJ SOLN
INTRAMUSCULAR | Status: DC | PRN
  Administered 2024-04-04: 25 ug via INTRAVENOUS

## 2024-04-04 SURGICAL SUPPLY — 12 items
CATH BALLN WEDGE 5F 110CM (CATHETERS) IMPLANT
CATH INFINITI 5 FR JL3.5 (CATHETERS) IMPLANT
CATH INFINITI JR4 5F (CATHETERS) IMPLANT
DEVICE RAD TR BAND REGULAR (VASCULAR PRODUCTS) IMPLANT
DRAPE BRACHIAL (DRAPES) IMPLANT
GLIDESHEATH SLEND SS 6F .021 (SHEATH) IMPLANT
GUIDEWIRE INQWIRE 1.5J.035X260 (WIRE) IMPLANT
PACK CARDIAC CATH (CUSTOM PROCEDURE TRAY) ×1 IMPLANT
SET ATX-X65L (MISCELLANEOUS) IMPLANT
SHEATH GLIDE SLENDER 4/5FR (SHEATH) IMPLANT
STATION PROTECTION PRESSURIZED (MISCELLANEOUS) IMPLANT
WIRE NITINOL .018 (WIRE) IMPLANT

## 2024-04-04 NOTE — Interval H&P Note (Signed)
 History and Physical Interval Note:  04/04/2024 9:25 AM  [name]  has presented today for surgery, with the diagnosis of R and L Cath   Aortic valve stenosis.  The various methods of treatment have been discussed with the patient and family. After consideration of risks, benefits and other options for treatment, the patient has consented to  Procedure(s): RIGHT/LEFT HEART CATH AND CORONARY ANGIOGRAPHY (Bilateral) as a surgical intervention.  The patient's history has been reviewed, patient examined, no change in status, stable for surgery.  I have reviewed the patient's chart and labs.  Questions were answered to the patient's satisfaction.     [name]

## 2024-04-06 ENCOUNTER — Encounter: Payer: Self-pay | Admitting: Surgery

## 2024-04-06 ENCOUNTER — Ambulatory Visit: Attending: Surgery | Admitting: Surgery

## 2024-04-06 VITALS — BP 133/85 | HR 88 | Resp 20 | Ht 62.0 in | Wt 142.0 lb

## 2024-04-06 DIAGNOSIS — I350 Nonrheumatic aortic (valve) stenosis: Secondary | ICD-10-CM | POA: Diagnosis not present

## 2024-04-06 NOTE — Progress Notes (Signed)
 [address] [name] [address]             [phone]     Cardiothoracic Surgery Consultation  PCP is Leslie Bail, MD Referring Provider is Leslie Evalene PARAS, MD  Chief Complaint  Patient presents with   Aortic Stenosis    Surgical consult, Cardiac Cath 03/31/24/ ECHO 03/28/24    HPI:  The patient is a 53 year old woman who was seen by [name] in 2016 for chest discomfort and a low-grade murmur was heard prompting an echo which showed mild aortic valve calcification with a mean gradient of 14 mm Hg consistent with mild aortic stenosis. She now presents with shortness of breath walking up hills or stair and some swelling in her ankles which is new. She has noticed the swelling present when she wakes up in the morning and it does not seem to change over the course of the day. She denies any chest discomfort, dizziness or orthopnea. 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 A 2D echo on 03/28/24 showed an increase in the mean gradient to 39 mm Hg with a DI of 0.19. There was an indeterminate number of cusps. Ascending aortic diameter was 3.5 cm. Cardiac cath on 04/04/2024 showed normal coronary arteries.    Past Medical History:  Diagnosis Date   History of palpitations    Migraine    Mitral valve prolapse    Wears contact lenses     Past Surgical History:  Procedure Laterality Date   APPENDECTOMY     BICEPT TENODESIS Right 01/29/2024   Procedure: TENODESIS, BICEPS;  Surgeon: Leslie Priest, MD;  Location: [facility];  Service: Orthopedics;  Laterality: Right;  Right shoulder arthroscopic rotator cuff repair, biceps tenodesis, distal clavicle excision, subacromial decompression   CESAREAN SECTION     ESOPHAGOGASTRODUODENOSCOPY (EGD) WITH PROPOFOL  N/A 04/07/2016   Procedure: ESOPHAGOGASTRODUODENOSCOPY (EGD) WITH PROPOFOL ;  Surgeon: Leslie Copping, MD;  Location: [facility];  Service: Endoscopy;  Laterality:  N/A;   RIGHT HEART CATH AND CORONARY ANGIOGRAPHY Bilateral 04/04/2024   Procedure: RIGHT HEART CATH AND CORONARY ANGIOGRAPHY;  Surgeon: Leslie Deatrice LABOR, MD;  Location: [facility];  Service: Cardiovascular;  Laterality: Bilateral;   SHOULDER ARTHROSCOPY WITH OPEN ROTATOR CUFF REPAIR Right 01/29/2024   Procedure: ARTHROSCOPY, SHOULDER WITH REPAIR, ROTATOR CUFF;  Surgeon: Leslie Priest, MD;  Location: [facility];  Service: Orthopedics;  Laterality: Right;  Right shoulder arthroscopic rotator cuff repair, biceps tenodesis, distal clavicle excision, subacromial decompression   SUBACROMIAL DECOMPRESSION Right 01/29/2024   Procedure: DECOMPRESSION, SUBACROMIAL SPACE;  Surgeon: Leslie Priest, MD;  Location: [facility];  Service: Orthopedics;  Laterality: Right;  Right shoulder arthroscopic rotator cuff repair, biceps tenodesis, distal clavicle excision, subacromial decompression   TONSILLECTOMY      Family History  Problem Relation Age of Onset   Heart failure Father    Breast cancer Maternal Aunt        mother's half sister   Breast cancer Paternal Aunt     Social History Social History   Tobacco Use   Smoking status: Never   Smokeless tobacco: Never  Vaping Use   Vaping status: Never Used  Substance Use Topics   Alcohol use: No   Drug use: No    Current Outpatient Medications  Medication Sig Dispense Refill   acetaminophen  (TYLENOL ) 500 MG tablet Take 2 tablets (1,000 mg total) by mouth every  8 (eight) hours. 90 tablet 2   B Complex Vitamins (B COMPLEX PO) Take 1 tablet by mouth daily.     Biotin w/ Vitamins C & E (HAIR SKIN & NAILS GUMMIES PO) Take 2 each by mouth daily.     Cholecalciferol (D3 5000 PO) Take 2,000 Units by mouth daily.     ibuprofen (ADVIL) 200 MG tablet Take 200-600 mg by mouth every 6 (six) hours as needed for mild pain (pain score 1-3).     Multiple Vitamins tablet Take 1 tablet by mouth daily.      ondansetron  (ZOFRAN ) 8 MG tablet Take 8  mg by mouth every 8 (eight) hours as needed for nausea or vomiting.     ondansetron  (ZOFRAN -ODT) 8 MG disintegrating tablet Take 8 mg by mouth every 8 (eight) hours as needed for nausea or vomiting.     rizatriptan  (MAXALT ) 10 MG tablet Take 10 mg by mouth as needed for migraine. May repeat in 2 hours if needed     No current facility-administered medications for this visit.    No Known Allergies  Review of Systems  Constitutional:  Positive for fatigue. Negative for activity change.  HENT:  Negative for dental problem.        Sees dentist regularly  Eyes: Negative.   Respiratory:  Positive for shortness of breath.   Cardiovascular:  Positive for palpitations and leg swelling. Negative for chest pain.  Gastrointestinal: Negative.   Endocrine: Negative.   Genitourinary: Negative.   Musculoskeletal: Negative.   Skin: Negative.   Allergic/Immunologic: Negative.   Neurological:  Positive for headaches. Negative for dizziness and syncope.  Hematological: Negative.   Psychiatric/Behavioral: Negative.      BP 133/85   Pulse 88   Resp 20   Ht 5' 2 (1.575 m)   Wt 142 lb (64.4 kg)   LMP 10/21/2016   SpO2 97% Comment: RA  BMI 25.97 kg/m  Physical Exam Constitutional:      Appearance: Normal appearance. She is normal weight.  HENT:     Head: Normocephalic and atraumatic.  Eyes:     Extraocular Movements: Extraocular movements intact.     Conjunctiva/sclera: Conjunctivae normal.     Pupils: Pupils are equal, round, and reactive to light.  Neck:     Vascular: No carotid bruit.  Cardiovascular:     Rate and Rhythm: Normal rate and regular rhythm.     Pulses: Normal pulses.     Comments: 3/6 systolic murmur RSB, no diastolic murmur Pulmonary:     Effort: Pulmonary effort is normal.     Breath sounds: Normal breath sounds.  Musculoskeletal:     Comments: Mild ankle edema bilaterally  Skin:    General: Skin is warm and dry.  Neurological:     General: No focal deficit  present.     Mental Status: She is alert and oriented to person, place, and time.  Psychiatric:        Mood and Affect: Mood normal.        Behavior: Behavior normal.    Diagnostic Tests:  ECHOCARDIOGRAM REPORT       Patient Name:   [name] Date of Exam: 03/28/2024  Medical Rec #:  [id number]              Height:       62.0 in  Accession #:    [id number]             Weight:  143.4 lb  Date of Birth:  [birth year]              BSA:          1.660 m  Patient Age:    53 years               BP:           122/90 mmHg  Patient Gender: F                      HR:           98 bpm.  Exam Location:     Procedure: 2D Echo, Cardiac Doppler and Color Doppler (Both Spectral and  Color            Flow Doppler were utilized during procedure).   Indications:    R01.1 Murmur    History:        Patient has no prior history of Echocardiogram  examinations.                 Mitral Valve Prolapse, Signs/Symptoms:Shortness of Breath,                  Dyspnea, Murmur and Edema; Risk Factors:Non-Smoker.    Sonographer:    Doyal Point MHA, BS, RDCS  Referring Phys: [id number] EVALENE JINNY LUNGER     Sonographer Comments: Severe AS present, Dr. Lunger spoke to pt while in  the office.  IMPRESSIONS     1. Left ventricular ejection fraction, by estimation, is 60 to 65%. The  left ventricle has normal function. The left ventricle has no regional  wall motion abnormalities. Left ventricular diastolic parameters are  indeterminate.   2. Right ventricular systolic function is normal. The right ventricular  size is normal.   3. The mitral valve is normal in structure. Mild mitral valve  regurgitation. No evidence of mitral stenosis.   4. The aortic valve has an indeterminant number of cusps. Aortic valve  regurgitation is not visualized. Severe aortic valve stenosis. Aortic  valve mean gradient measures 39.0 mmHg. Aortic valve Vmax measures 4.00  m/s.   5. The inferior vena  cava is normal in size with greater than 50%  respiratory variability, suggesting right atrial pressure of 3 mmHg.   FINDINGS   Left Ventricle: Left ventricular ejection fraction, by estimation, is 60  to 65%. The left ventricle has normal function. The left ventricle has no  regional wall motion abnormalities. Strain was performed and the global  longitudinal strain is  indeterminate. The left ventricular internal cavity size was normal in  size. There is no left ventricular hypertrophy. Left ventricular diastolic  parameters are indeterminate.   Right Ventricle: The right ventricular size is normal. No increase in  right ventricular wall thickness. Right ventricular systolic function is  normal.   Left Atrium: Left atrial size was normal in size.   Right Atrium: Right atrial size was normal in size.   Pericardium: There is no evidence of pericardial effusion.   Mitral Valve: The mitral valve is normal in structure. Mild mitral valve  regurgitation. No evidence of mitral valve stenosis.   Tricuspid Valve: The tricuspid valve is normal in structure. Tricuspid  valve regurgitation is not demonstrated. No evidence of tricuspid  stenosis.   Aortic Valve: The aortic valve has an indeterminant number of cusps.  Aortic valve regurgitation is not visualized. Severe aortic stenosis is  present. Aortic valve mean gradient measures 39.0  mmHg. Aortic valve peak  gradient measures 64.0 mmHg.   Pulmonic Valve: The pulmonic valve was normal in structure. Pulmonic valve  regurgitation is not visualized. No evidence of pulmonic stenosis.   Aorta: The aortic root is normal in size and structure.   Venous: The inferior vena cava is normal in size with greater than 50%  respiratory variability, suggesting right atrial pressure of 3 mmHg.   IAS/Shunts: No atrial level shunt detected by color flow Doppler.   Additional Comments: 3D was performed not requiring image post processing  on an  independent workstation and was indeterminate.     LEFT VENTRICLE  PLAX 2D  LVIDd:         4.18 cm Diastology  LVIDs:         2.86 cm LV e' medial:    6.20 cm/s  LV PW:         0.99 cm LV E/e' medial:  16.9  LV IVS:        0.84 cm LV e' lateral:   10.60 cm/s                         LV E/e' lateral: 9.9     RIGHT VENTRICLE  RV Basal diam:  2.64 cm  RV Mid diam:    1.91 cm  RV S prime:     13.80 cm/s  TAPSE (M-mode): 2.8 cm   LEFT ATRIUM           Index        RIGHT ATRIUM           Index  LA diam:      2.90 cm 1.75 cm/m   RA Area:     11.50 cm  LA Vol (A2C): 14.8 ml 8.92 ml/m   RA Volume:   23.70 ml  14.28 ml/m  LA Vol (A4C): 31.8 ml 19.16 ml/m   AORTIC VALVE  AV Vmax:           400.00 cm/s  AV Vmean:          294.000 cm/s  AV VTI:            0.794 m  AV Peak Grad:      64.0 mmHg  AV Mean Grad:      39.0 mmHg  LVOT Vmax:         78.60 cm/s  LVOT Vmean:        55.000 cm/s  LVOT VTI:          0.148 m  LVOT/AV VTI ratio: 0.19    AORTA  Ao Asc diam: 3.50 cm   MITRAL VALVE  MV Area (PHT): 4.21 cm     SHUNTS  MV Decel Time: 180 msec     Systemic VTI: 0.15 m  MV E velocity: 105.00 cm/s  MV A velocity: 82.30 cm/s  MV E/A ratio:  1.28   Evalene Lunger MD  Electronically signed by Evalene Lunger MD  Signature Date/Time: 03/28/2024/5:56:55 PM        Final     Procedures  RIGHT HEART CATH AND CORONARY ANGIOGRAPHY   Conclusion  1.  Normal coronary arteries. 2.  I did not attempt to cross the aortic valve.  Severe AS by echo 3.  Right heart catheterization showed normal right atrial pressure, high normal pulmonary pressure, mildly elevated wedge pressure at 13 mmHg and normal cardiac output.   Recommendations: Agree with surgical consultation for aortic valve replacement.  The aortic valve appears to be bicuspid on echo.  Consider CT chest to evaluate the ascending aorta.    Impression:  This 53 year old woman has stage D, severe, symptomatic aortic stenosis  with NYHA class II symptoms of exertional fatigue and shortness of breath consistent with chronic diastolic congestive heart failure.  Her echo shows an indeterminate number of cusps and is likely bicuspid given her age.  I agree that aortic valve replacement is indicated for relief of her symptoms and to prevent progressive left ventricular dysfunction.  Her cardiac catheterization shows no coronary disease.  Her aortic root and ascending aorta appear to be a fairly normal size on echocardiogram so I do not think a CT scan is necessary.  I discussed the alternatives of bioprosthetic and mechanical valves with her including the pros and cons of both.  She would like to think about that choice.  I have reviewed the echo and cardiac catheterization images with her and answered all of her questions. I discussed the operative procedure with the patient including alternatives, benefits and risks; including but not limited to bleeding, blood transfusion, infection, stroke, myocardial infarction, graft failure, heart block requiring a permanent pacemaker, organ dysfunction, and death.  [name] understands and agrees to proceed.     Plan:  She will call us  back to schedule aortic valve replacement.  She will think about the valve choice and let us  know prior to surgery.  I spent 60 minutes performing this consultation and > 50% of this time was spent face to face counseling and coordinating the care of this patient's severe symptomatic aortic stenosis.   Dorise MARLA Fellers, MD [facility] [phone]

## 2024-04-07 ENCOUNTER — Other Ambulatory Visit: Payer: Self-pay | Admitting: *Deleted

## 2024-04-07 ENCOUNTER — Encounter: Payer: Self-pay | Admitting: *Deleted

## 2024-04-07 DIAGNOSIS — G8929 Other chronic pain: Secondary | ICD-10-CM | POA: Diagnosis not present

## 2024-04-07 DIAGNOSIS — M25511 Pain in right shoulder: Secondary | ICD-10-CM | POA: Diagnosis not present

## 2024-04-07 DIAGNOSIS — I350 Nonrheumatic aortic (valve) stenosis: Secondary | ICD-10-CM

## 2024-04-12 DIAGNOSIS — M25511 Pain in right shoulder: Secondary | ICD-10-CM | POA: Diagnosis not present

## 2024-04-12 DIAGNOSIS — G8929 Other chronic pain: Secondary | ICD-10-CM | POA: Diagnosis not present

## 2024-04-14 ENCOUNTER — Telehealth: Payer: Self-pay

## 2024-04-14 DIAGNOSIS — G8929 Other chronic pain: Secondary | ICD-10-CM | POA: Diagnosis not present

## 2024-04-14 DIAGNOSIS — M25511 Pain in right shoulder: Secondary | ICD-10-CM | POA: Diagnosis not present

## 2024-04-14 NOTE — Telephone Encounter (Signed)
 FMLA form completed and faxed to [employer] @ [phone].Leave intake # [id number] Beginning LOA 05/03/24 through 08/01/24./ DOS 05/03/24

## 2024-04-15 NOTE — Telephone Encounter (Signed)
 That appointment can be moved and we can follow up after surgery.

## 2024-04-18 ENCOUNTER — Ambulatory Visit: Admitting: Cardiology

## 2024-04-22 DIAGNOSIS — G8929 Other chronic pain: Secondary | ICD-10-CM | POA: Diagnosis not present

## 2024-04-22 DIAGNOSIS — M25511 Pain in right shoulder: Secondary | ICD-10-CM | POA: Diagnosis not present

## 2024-04-25 ENCOUNTER — Other Ambulatory Visit: Payer: Self-pay

## 2024-04-25 MED ORDER — RIZATRIPTAN BENZOATE 10 MG PO TABS
10.0000 mg | ORAL_TABLET | ORAL | 1 refills | Status: AC | PRN
  Filled 2024-04-25: qty 30, 15d supply, fill #0

## 2024-04-26 ENCOUNTER — Other Ambulatory Visit: Payer: Self-pay

## 2024-04-26 DIAGNOSIS — M25511 Pain in right shoulder: Secondary | ICD-10-CM | POA: Diagnosis not present

## 2024-04-26 DIAGNOSIS — G8929 Other chronic pain: Secondary | ICD-10-CM | POA: Diagnosis not present

## 2024-04-26 NOTE — Pre-Procedure Instructions (Signed)
 Surgical Instructions   Your procedure is scheduled on May 03, 2024. Report to [facility] Main Entrance A at 5:30 A.M., then check in with the Admitting office. Any questions or running late day of surgery: call [phone]  Questions prior to your surgery date: call [phone], Monday-Friday, 8am-4pm. If you experience any cold or flu symptoms such as cough, fever, chills, shortness of breath, etc. between now and your scheduled surgery, please notify us  at the above number.     Remember:  Do not eat or drink after midnight the night before your surgery. No gum, mints, or hard candy.      Take these medicines the morning of surgery with A SIP OF WATER : acetaminophen  (TYLENOL )    May take these medicines IF NEEDED: cyclobenzaprine  (FLEXERIL )  ondansetron  (ZOFRAN -ODT)  rizatriptan  (MAXALT )  One week prior to surgery, STOP taking any Aspirin  (unless otherwise instructed by your surgeon) Aleve, Naproxen, Ibuprofen, Motrin, Advil, Goody's, BC's, all herbal medications, fish oil, and non-prescription vitamins.                     Do NOT Smoke (Tobacco/Vaping) for 24 hours prior to your procedure.  If you use a CPAP at night, you may bring your mask/headgear for your overnight stay.   You will be asked to remove any contacts, glasses, piercing's, hearing aid's, dentures/partials prior to surgery. Please bring cases for these items if needed.    Patients discharged the day of surgery will not be allowed to drive home, and someone needs to stay with them for 24 hours.  SURGICAL WAITING ROOM VISITATION Patients may have no more than 2 support people in the waiting area - these visitors may rotate.   Pre-op nurse will coordinate an appropriate time for 1 ADULT support person, who may not rotate, to accompany patient in pre-op.  Children under the age of 37 must have an adult with them who is not the patient and must remain in the main waiting area with an adult.  If the  patient needs to stay at the hospital during part of their recovery, the visitor guidelines for inpatient rooms apply.  Please refer to the [facility] website for the visitor guidelines for any additional information.   If you received a COVID test during your pre-op visit  it is requested that you wear a mask when out in public, stay away from anyone that may not be feeling well and notify your surgeon if you develop symptoms. If you have been in contact with anyone that has tested positive in the last 10 days please notify you surgeon.      Pre-operative CHG Bathing Instructions   You can play a key role in reducing the risk of infection after surgery. Your skin needs to be as free of germs as possible. You can reduce the number of germs on your skin by washing with CHG (chlorhexidine gluconate) soap before surgery. CHG is an antiseptic soap that kills germs and continues to kill germs even after washing.   DO NOT use if you have an allergy to chlorhexidine/CHG or antibacterial soaps. If your skin becomes reddened or irritated, stop using the CHG and notify one of our RNs at [phone].              TAKE A SHOWER THE NIGHT BEFORE SURGERY   Please keep in mind the following:  DO NOT shave, including legs and underarms, 48 hours prior to surgery.   You may shave  your face before/day of surgery.  Place clean sheets on your bed the night before surgery Use a clean washcloth (not used since being washed) for shower. DO NOT sleep with pet's night before surgery.  CHG Shower Instructions:  Wash your face and private area with normal soap. If you choose to wash your hair, wash first with your normal shampoo.  After you use shampoo/soap, rinse your hair and body thoroughly to remove shampoo/soap residue.  Turn the water  OFF and apply half the bottle of CHG soap to a CLEAN washcloth.  Apply CHG soap ONLY FROM YOUR NECK DOWN TO YOUR TOES (washing for 3-5 minutes)  DO NOT use CHG soap on face,  private areas, open wounds, or sores.  Pay special attention to the area where your surgery is being performed.  If you are having back surgery, having someone wash your back for you may be helpful. Wait 2 minutes after CHG soap is applied, then you may rinse off the CHG soap.  Pat dry with a clean towel  Put on clean pajamas    Additional instructions for the day of surgery: If you choose, you may shower the morning of surgery with an antibacterial soap.  DO NOT APPLY any lotions, deodorants, cologne, or perfumes.   Do not wear jewelry or makeup Do not wear nail polish, gel polish, artificial nails, or any other type of covering on natural nails (fingers and toes) Do not bring valuables to the hospital. [facility] is not responsible for valuables/personal belongings. Put on clean/comfortable clothes.  Please brush your teeth.  Ask your nurse before applying any prescription medications to the skin.

## 2024-04-27 ENCOUNTER — Other Ambulatory Visit: Payer: Self-pay

## 2024-04-27 ENCOUNTER — Ambulatory Visit (HOSPITAL_COMMUNITY)
Admission: RE | Admit: 2024-04-27 | Discharge: 2024-04-27 | Disposition: A | Discharge status: Home / Self Care | Source: Ambulatory Visit | Attending: Surgery | Admitting: Surgery

## 2024-04-27 ENCOUNTER — Encounter (HOSPITAL_COMMUNITY): Payer: Self-pay

## 2024-04-27 ENCOUNTER — Inpatient Hospital Stay (HOSPITAL_COMMUNITY)
Admission: RE | Admit: 2024-04-27 | Discharge: 2024-04-27 | Disposition: A | Source: Ambulatory Visit | Attending: Surgery

## 2024-04-27 ENCOUNTER — Other Ambulatory Visit: Payer: Self-pay | Admitting: *Deleted

## 2024-04-27 VITALS — BP 121/61 | HR 77 | Temp 98.3°F | Resp 18 | Ht 62.0 in | Wt 142.5 lb

## 2024-04-27 DIAGNOSIS — I2510 Atherosclerotic heart disease of native coronary artery without angina pectoris: Secondary | ICD-10-CM | POA: Diagnosis not present

## 2024-04-27 DIAGNOSIS — I350 Nonrheumatic aortic (valve) stenosis: Secondary | ICD-10-CM

## 2024-04-27 DIAGNOSIS — Z01818 Encounter for other preprocedural examination: Secondary | ICD-10-CM | POA: Insufficient documentation

## 2024-04-27 HISTORY — DX: Failed or difficult intubation, initial encounter: T88.4XXA

## 2024-04-27 HISTORY — DX: Nonrheumatic aortic (valve) stenosis: I35.0

## 2024-04-27 LAB — SURGICAL PCR SCREEN
MRSA, PCR: NEGATIVE
Staphylococcus aureus: NEGATIVE

## 2024-04-27 LAB — PROTIME-INR
INR: 0.9 (ref 0.8–1.2)
Prothrombin Time: 13 s (ref 11.4–15.2)

## 2024-04-27 LAB — URINALYSIS, ROUTINE W REFLEX MICROSCOPIC
Bilirubin Urine: NEGATIVE
Glucose, UA: NEGATIVE mg/dL
Hgb urine dipstick: NEGATIVE
Ketones, ur: NEGATIVE mg/dL
Nitrite: NEGATIVE
Protein, ur: NEGATIVE mg/dL
Specific Gravity, Urine: 1.006 (ref 1.005–1.030)
pH: 5 (ref 5.0–8.0)

## 2024-04-27 LAB — COMPREHENSIVE METABOLIC PANEL WITH GFR
ALT: 22 U/L (ref 0–44)
AST: 23 U/L (ref 15–41)
Albumin: 4.1 g/dL (ref 3.5–5.0)
Alkaline Phosphatase: 66 U/L (ref 38–126)
Anion gap: 13 (ref 5–15)
BUN: 13 mg/dL (ref 6–20)
CO2: 25 mmol/L (ref 22–32)
Calcium: 9.8 mg/dL (ref 8.9–10.3)
Chloride: 102 mmol/L (ref 98–111)
Creatinine, Ser: 0.57 mg/dL (ref 0.44–1.00)
GFR, Estimated: >60 mL/min (ref >60)
Glucose, Bld: 98 mg/dL (ref 70–99)
Potassium: 4.1 mmol/L (ref 3.5–5.1)
Sodium: 140 mmol/L (ref 135–145)
Total Bilirubin: 0.7 mg/dL (ref 0.0–1.2)
Total Protein: 7.2 g/dL (ref 6.5–8.1)

## 2024-04-27 LAB — CBC
HCT: 41.5 % (ref 36.0–46.0)
Hemoglobin: 13.8 g/dL (ref 12.0–15.0)
MCH: 29.4 pg (ref 26.0–34.0)
MCHC: 33.3 g/dL (ref 30.0–36.0)
MCV: 88.3 fL (ref 80.0–100.0)
Platelets: 366 K/uL (ref 150–400)
RBC: 4.7 MIL/uL (ref 3.87–5.11)
RDW: 12.3 % (ref 11.5–15.5)
WBC: 7.2 K/uL (ref 4.0–10.5)
nRBC: 0 % (ref 0.0–0.2)

## 2024-04-27 LAB — TYPE AND SCREEN
ABO/RH(D): A POS
Antibody Screen: NEGATIVE

## 2024-04-27 LAB — APTT: aPTT: 29 s (ref 24–36)

## 2024-04-27 MED ORDER — CIPROFLOXACIN HCL 500 MG PO TABS
500.0000 mg | ORAL_TABLET | Freq: Two times a day (BID) | ORAL | 0 refills | Status: DC
  Filled 2024-04-27: qty 12, 6d supply, fill #0

## 2024-04-27 NOTE — Progress Notes (Signed)
 Cipro  sent to patient's pharmacy per Dr. Lucas d/t UA findings at PAT.

## 2024-04-27 NOTE — Progress Notes (Signed)
 Darius Bump, RN, notified about abnormal UA results

## 2024-04-27 NOTE — Progress Notes (Signed)
 PCP - Dr. Lavenia Beaver Cardiologist - Dr. Evalene Lunger  PPM/ICD - denies   Chest x-ray - 04/27/24 EKG - 04/27/24 Stress Test - 08/29/14 ECHO - 03/28/24 Cardiac Cath - 04/04/24  Sleep Study - denies   DM- denies  Last dose of GLP1 agonist-  n/a   ASA/Blood Thinner Instructions: n/a   ERAS Protcol - no, NPO   COVID TEST- n/a   Anesthesia review: yes, cardiac hx  Patient denies shortness of breath, fever, cough and chest pain at PAT appointment   All instructions explained to the patient, with a verbal understanding of the material. Patient agrees to go over the instructions while at home for a better understanding. The opportunity to ask questions was provided.

## 2024-04-28 LAB — HEMOGLOBIN A1C
Hgb A1c MFr Bld: 5.6 % (ref 4.8–5.6)
Mean Plasma Glucose: 114 mg/dL

## 2024-05-02 ENCOUNTER — Encounter (HOSPITAL_COMMUNITY): Payer: Self-pay

## 2024-05-02 MED ORDER — INSULIN REGULAR(HUMAN) IN NACL 100-0.9 UT/100ML-% IV SOLN
INTRAVENOUS | Status: AC
  Administered 2024-05-03: 1.1 [IU]/h via INTRAVENOUS
  Filled 2024-05-02: qty 100

## 2024-05-02 MED ORDER — MILRINONE LACTATE IN DEXTROSE 20-5 MG/100ML-% IV SOLN
0.3000 ug/kg/min | INTRAVENOUS | Status: DC
  Filled 2024-05-02: qty 100

## 2024-05-02 MED ORDER — EPINEPHRINE HCL 5 MG/250ML IV SOLN IN NS
0.0000 ug/min | INTRAVENOUS | Status: DC
  Filled 2024-05-02: qty 250

## 2024-05-02 MED ORDER — PHENYLEPHRINE HCL-NACL 20-0.9 MG/250ML-% IV SOLN
30.0000 ug/min | INTRAVENOUS | Status: AC
  Administered 2024-05-03: 15 ug/min via INTRAVENOUS
  Filled 2024-05-02: qty 250

## 2024-05-02 MED ORDER — DEXMEDETOMIDINE HCL IN NACL 400 MCG/100ML IV SOLN
0.1000 ug/kg/h | INTRAVENOUS | Status: AC
  Administered 2024-05-03: .5 ug/kg/h via INTRAVENOUS
  Filled 2024-05-02: qty 100

## 2024-05-02 MED ORDER — VANCOMYCIN HCL 1250 MG/250ML IV SOLN
1250.0000 mg | INTRAVENOUS | Status: AC
  Administered 2024-05-03: 1250 mg via INTRAVENOUS
  Filled 2024-05-02: qty 250

## 2024-05-02 MED ORDER — MANNITOL 20 % IV SOLN
INTRAVENOUS | Status: DC
  Filled 2024-05-02: qty 13

## 2024-05-02 MED ORDER — CEFAZOLIN SODIUM-DEXTROSE 2-4 GM/100ML-% IV SOLN
2.0000 g | INTRAVENOUS | Status: DC
  Filled 2024-05-02: qty 100

## 2024-05-02 MED ORDER — NITROGLYCERIN IN D5W 200-5 MCG/ML-% IV SOLN
2.0000 ug/min | INTRAVENOUS | Status: DC
  Filled 2024-05-02: qty 250

## 2024-05-02 MED ORDER — PLASMA-LYTE A IV SOLN
INTRAVENOUS | Status: DC
  Filled 2024-05-02: qty 2.5

## 2024-05-02 MED ORDER — TRANEXAMIC ACID 1000 MG/10ML IV SOLN
1.5000 mg/kg/h | INTRAVENOUS | Status: AC
  Administered 2024-05-03: 1.5 mg/kg/h via INTRAVENOUS
  Filled 2024-05-02: qty 25

## 2024-05-02 MED ORDER — HEPARIN 30,000 UNITS/1000 ML (OHS) CELLSAVER SOLUTION
Status: DC
  Filled 2024-05-02: qty 1000

## 2024-05-02 MED ORDER — TRANEXAMIC ACID (OHS) PUMP PRIME SOLUTION
2.0000 mg/kg | INTRAVENOUS | Status: DC
  Filled 2024-05-02: qty 1.29

## 2024-05-02 MED ORDER — POTASSIUM CHLORIDE 2 MEQ/ML IV SOLN
80.0000 meq | INTRAVENOUS | Status: DC
  Filled 2024-05-02: qty 40

## 2024-05-02 MED ORDER — CEFAZOLIN SODIUM-DEXTROSE 2-4 GM/100ML-% IV SOLN
2.0000 g | INTRAVENOUS | Status: AC
  Administered 2024-05-03 (×2): 2 g via INTRAVENOUS
  Filled 2024-05-02: qty 100

## 2024-05-02 MED ORDER — NOREPINEPHRINE 4 MG/250ML-% IV SOLN
0.0000 ug/min | INTRAVENOUS | Status: DC
  Filled 2024-05-02: qty 250

## 2024-05-02 MED ORDER — TRANEXAMIC ACID (OHS) BOLUS VIA INFUSION
15.0000 mg/kg | INTRAVENOUS | Status: AC
  Administered 2024-05-03: 969 mg via INTRAVENOUS
  Filled 2024-05-02: qty 969

## 2024-05-02 NOTE — Hospital Course (Addendum)
 HPI: The patient is a 53 year old woman who was seen by Dr. Gollan in 2016 for chest discomfort and a low-grade murmur was heard prompting an echo which showed mild aortic valve calcification with a mean gradient of 14 mm Hg consistent with mild aortic stenosis. She now presents with shortness of breath walking up hills or stair and some swelling in her ankles which is new. She has noticed the swelling present when she wakes up in the morning and it does not seem to change over the course of the day. She denies any chest discomfort, dizziness or orthopnea. 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 A 2D echo on 03/28/24 showed an increase in the mean gradient to 39 mm Hg with a DI of 0.19. There was an indeterminate number of cusps. Ascending aortic diameter was 3.5 cm. Cardiac cath on 04/04/2024 showed normal coronary arteries. Dr. Lucas discussed the need for aortic valve replacement. He discussed potential risks, benefits, and complications of the surgery. Dr. Lucas also discussed the alternatives of bioprosthetic and mechanical valves with her including the pros and cons of both. She chose to proceed with a bioprosthetic valve. Pre operative carotid duplex US  showed no significant internal carotid artery stenosis bilaterally.  Hospital Course: Patient underwent a median sternotomy for aortic valve replacement (size 21 mm). She was transported from the OR to [facility] ICU in stable condition. She was extubated early the afternoon of surgery. Norva Purl and a line were removed on POD 1. Chest tubes were removed later on POD 1. SBP was initially low normal so Lopressor  not started yet. She was transitioned off the Insulin  drip. Her pre op HGA1C was 5.6. Accu checks and SS will be stopped in the next 24 hours. She had persistent nausea the evening of POD 1. Zofran  was not really helping so this was stopped and she was given Phenergan  and scheduled Reglan . She was then put on Meclizine  as it  was thought she had vertigo. She went into a fib and was put on an Amiodarone  drip POD 3.  She converted back to sinus rhythm within a short time but then had some recurrent A-fib.  She was converted to oral amiodarone  on postop day 4.  She was ready for transfer to [facility] on postop day 4 but no bed did not become available until the following day.  She remained in stable sinus rhythm.  She progressed appropriately with mobility.  Diuresis continued.

## 2024-05-02 NOTE — H&P (Signed)
 [address], [place] [address]                         [phone]     Cardiothoracic Surgery Admission History and Physical   PCP is Sherial Bail, MD Referring Provider is Perla Evalene PARAS, MD       Chief Complaint  Patient presents with   Aortic Stenosis           HPI:   The patient is a 53 year old woman who was seen by Dr. Gollan in 2016 for chest discomfort and a low-grade murmur was heard prompting an echo which showed mild aortic valve calcification with a mean gradient of 14 mm Hg consistent with mild aortic stenosis. She now presents with shortness of breath walking up hills or stair and some swelling in her ankles which is new. She has noticed the swelling present when she wakes up in the morning and it does not seem to change over the course of the day. She denies any chest discomfort, dizziness or orthopnea. 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 A 2D echo on 03/28/24 showed an increase in the mean gradient to 39 mm Hg with a DI of 0.19. There was an indeterminate number of cusps. Ascending aortic diameter was 3.5 cm. Cardiac cath on 04/04/2024 showed normal coronary arteries.           Past Medical History:  Diagnosis Date   History of palpitations     Migraine     Mitral valve prolapse     Wears contact lenses                 Past Surgical History:  Procedure Laterality Date   APPENDECTOMY       BICEPT TENODESIS Right 01/29/2024    Procedure: TENODESIS, BICEPS;  Surgeon: Tobie Priest, MD;  Location: [facility];  Service: Orthopedics;  Laterality: Right;  Right shoulder arthroscopic rotator cuff repair, biceps tenodesis, distal clavicle excision, subacromial decompression   CESAREAN SECTION       ESOPHAGOGASTRODUODENOSCOPY (EGD) WITH PROPOFOL  N/A 04/07/2016    Procedure: ESOPHAGOGASTRODUODENOSCOPY (EGD) WITH PROPOFOL ;  Surgeon: Rogelia Copping, MD;  Location: [facility];  Service:  Endoscopy;  Laterality: N/A;   RIGHT HEART CATH AND CORONARY ANGIOGRAPHY Bilateral 04/04/2024    Procedure: RIGHT HEART CATH AND CORONARY ANGIOGRAPHY;  Surgeon: Darron Deatrice LABOR, MD;  Location: [facility];  Service: Cardiovascular;  Laterality: Bilateral;   SHOULDER ARTHROSCOPY WITH OPEN ROTATOR CUFF REPAIR Right 01/29/2024    Procedure: ARTHROSCOPY, SHOULDER WITH REPAIR, ROTATOR CUFF;  Surgeon: Tobie Priest, MD;  Location: [facility];  Service: Orthopedics;  Laterality: Right;  Right shoulder arthroscopic rotator cuff repair, biceps tenodesis, distal clavicle excision, subacromial decompression   SUBACROMIAL DECOMPRESSION Right 01/29/2024    Procedure: DECOMPRESSION, SUBACROMIAL SPACE;  Surgeon: Tobie Priest, MD;  Location: [facility];  Service: Orthopedics;  Laterality: Right;  Right shoulder arthroscopic rotator cuff repair, biceps tenodesis, distal clavicle excision, subacromial decompression   TONSILLECTOMY                   Family History  Problem Relation Age of Onset   Heart failure Father     Breast cancer Maternal Aunt          mother's half sister   Breast cancer Paternal Aunt  Social History Social History  Social History        Tobacco Use   Smoking status: Never   Smokeless tobacco: Never  Vaping Use   Vaping status: Never Used  Substance Use Topics   Alcohol use: No   Drug use: No              Current Outpatient Medications  Medication Sig Dispense Refill   acetaminophen  (TYLENOL ) 500 MG tablet Take 2 tablets (1,000 mg total) by mouth every 8 (eight) hours. 90 tablet 2   B Complex Vitamins (B COMPLEX PO) Take 1 tablet by mouth daily.       Biotin w/ Vitamins C & E (HAIR SKIN & NAILS GUMMIES PO) Take 2 each by mouth daily.       Cholecalciferol (D3 5000 PO) Take 2,000 Units by mouth daily.       ibuprofen (ADVIL) 200 MG tablet Take 200-600 mg by mouth every 6 (six) hours as needed for mild pain (pain score 1-3).        Multiple Vitamins tablet Take 1 tablet by mouth daily.        ondansetron  (ZOFRAN ) 8 MG tablet Take 8 mg by mouth every 8 (eight) hours as needed for nausea or vomiting.       ondansetron  (ZOFRAN -ODT) 8 MG disintegrating tablet Take 8 mg by mouth every 8 (eight) hours as needed for nausea or vomiting.       rizatriptan  (MAXALT ) 10 MG tablet Take 10 mg by mouth as needed for migraine. May repeat in 2 hours if needed          No current facility-administered medications for this visit.        Allergies  No Known Allergies     Review of Systems  Constitutional:  Positive for fatigue. Negative for activity change.  HENT:  Negative for dental problem.        Sees dentist regularly  Eyes: Negative.   Respiratory:  Positive for shortness of breath.   Cardiovascular:  Positive for palpitations and leg swelling. Negative for chest pain.  Gastrointestinal: Negative.   Endocrine: Negative.   Genitourinary: Negative.   Musculoskeletal: Negative.   Skin: Negative.   Allergic/Immunologic: Negative.   Neurological:  Positive for headaches. Negative for dizziness and syncope.  Hematological: Negative.   Psychiatric/Behavioral: Negative.       BP 133/85   Pulse 88   Resp 20   Ht 5' 2 (1.575 m)   Wt 142 lb (64.4 kg)   LMP 10/21/2016   SpO2 97% Comment: RA  BMI 25.97 kg/m  Physical Exam Constitutional:      Appearance: Normal appearance. She is normal weight.  HENT:     Head: Normocephalic and atraumatic.  Eyes:     Extraocular Movements: Extraocular movements intact.     Conjunctiva/sclera: Conjunctivae normal.     Pupils: Pupils are equal, round, and reactive to light.  Neck:     Vascular: No carotid bruit.  Cardiovascular:     Rate and Rhythm: Normal rate and regular rhythm.     Pulses: Normal pulses.     Comments: 3/6 systolic murmur RSB, no diastolic murmur Pulmonary:     Effort: Pulmonary effort is normal.     Breath sounds: Normal breath sounds.  Musculoskeletal:      Comments: Mild ankle edema bilaterally  Skin:    General: Skin is warm and dry.  Neurological:     General: No focal deficit present.  Mental Status: She is alert and oriented to person, place, and time.  Psychiatric:        Mood and Affect: Mood normal.        Behavior: Behavior normal.     Diagnostic Tests:   ECHOCARDIOGRAM REPORT       Patient Name:   [name] Date of Exam: 03/28/2024  Medical Rec #:  [id number]              Height:       62.0 in  Accession #:    [id number]             Weight:       143.4 lb  Date of Birth:  07-18-1970              BSA:          1.660 m  Patient Age:    53 years               BP:           122/90 mmHg  Patient Gender: F                      HR:           98 bpm.  Exam Location:  [facility]   Procedure: 2D Echo, Cardiac Doppler and Color Doppler (Both Spectral and  Color            Flow Doppler were utilized during procedure).   Indications:    R01.1 Murmur    History:        Patient has no prior history of Echocardiogram  examinations.                 Mitral Valve Prolapse, Signs/Symptoms:Shortness of Breath,                  Dyspnea, Murmur and Edema; Risk Factors:Non-Smoker.    Sonographer:    Doyal Point MHA, BS, RDCS  Referring Phys: [id number] EVALENE JINNY LUNGER     Sonographer Comments: Severe AS present, Dr. Lunger spoke to pt while in  the office.  IMPRESSIONS     1. Left ventricular ejection fraction, by estimation, is 60 to 65%. The  left ventricle has normal function. The left ventricle has no regional  wall motion abnormalities. Left ventricular diastolic parameters are  indeterminate.   2. Right ventricular systolic function is normal. The right ventricular  size is normal.   3. The mitral valve is normal in structure. Mild mitral valve  regurgitation. No evidence of mitral stenosis.   4. The aortic valve has an indeterminant number of cusps. Aortic valve  regurgitation is not visualized. Severe aortic  valve stenosis. Aortic  valve mean gradient measures 39.0 mmHg. Aortic valve Vmax measures 4.00  m/s.   5. The inferior vena cava is normal in size with greater than 50%  respiratory variability, suggesting right atrial pressure of 3 mmHg.   FINDINGS   Left Ventricle: Left ventricular ejection fraction, by estimation, is 60  to 65%. The left ventricle has normal function. The left ventricle has no  regional wall motion abnormalities. Strain was performed and the global  longitudinal strain is  indeterminate. The left ventricular internal cavity size was normal in  size. There is no left ventricular hypertrophy. Left ventricular diastolic  parameters are indeterminate.   Right Ventricle: The right ventricular size is normal. No increase in  right ventricular wall thickness.  Right ventricular systolic function is  normal.   Left Atrium: Left atrial size was normal in size.   Right Atrium: Right atrial size was normal in size.   Pericardium: There is no evidence of pericardial effusion.   Mitral Valve: The mitral valve is normal in structure. Mild mitral valve  regurgitation. No evidence of mitral valve stenosis.   Tricuspid Valve: The tricuspid valve is normal in structure. Tricuspid  valve regurgitation is not demonstrated. No evidence of tricuspid  stenosis.   Aortic Valve: The aortic valve has an indeterminant number of cusps.  Aortic valve regurgitation is not visualized. Severe aortic stenosis is  present. Aortic valve mean gradient measures 39.0 mmHg. Aortic valve peak  gradient measures 64.0 mmHg.   Pulmonic Valve: The pulmonic valve was normal in structure. Pulmonic valve  regurgitation is not visualized. No evidence of pulmonic stenosis.   Aorta: The aortic root is normal in size and structure.   Venous: The inferior vena cava is normal in size with greater than 50%  respiratory variability, suggesting right atrial pressure of 3 mmHg.   IAS/Shunts: No atrial level  shunt detected by color flow Doppler.   Additional Comments: 3D was performed not requiring image post processing  on an independent workstation and was indeterminate.     LEFT VENTRICLE  PLAX 2D  LVIDd:         4.18 cm Diastology  LVIDs:         2.86 cm LV e' medial:    6.20 cm/s  LV PW:         0.99 cm LV E/e' medial:  16.9  LV IVS:        0.84 cm LV e' lateral:   10.60 cm/s                         LV E/e' lateral: 9.9     RIGHT VENTRICLE  RV Basal diam:  2.64 cm  RV Mid diam:    1.91 cm  RV S prime:     13.80 cm/s  TAPSE (M-mode): 2.8 cm   LEFT ATRIUM           Index        RIGHT ATRIUM           Index  LA diam:      2.90 cm 1.75 cm/m   RA Area:     11.50 cm  LA Vol (A2C): 14.8 ml 8.92 ml/m   RA Volume:   23.70 ml  14.28 ml/m  LA Vol (A4C): 31.8 ml 19.16 ml/m   AORTIC VALVE  AV Vmax:           400.00 cm/s  AV Vmean:          294.000 cm/s  AV VTI:            0.794 m  AV Peak Grad:      64.0 mmHg  AV Mean Grad:      39.0 mmHg  LVOT Vmax:         78.60 cm/s  LVOT Vmean:        55.000 cm/s  LVOT VTI:          0.148 m  LVOT/AV VTI ratio: 0.19    AORTA  Ao Asc diam: 3.50 cm   MITRAL VALVE  MV Area (PHT): 4.21 cm     SHUNTS  MV Decel Time: 180 msec     Systemic VTI: 0.15  m  MV E velocity: 105.00 cm/s  MV A velocity: 82.30 cm/s  MV E/A ratio:  1.28   Evalene Lunger MD  Electronically signed by Evalene Lunger MD  Signature Date/Time: 03/28/2024/5:56:55 PM        Final      Procedures   RIGHT HEART CATH AND CORONARY ANGIOGRAPHY    Conclusion   1.  Normal coronary arteries. 2.  I did not attempt to cross the aortic valve.  Severe AS by echo 3.  Right heart catheterization showed normal right atrial pressure, high normal pulmonary pressure, mildly elevated wedge pressure at 13 mmHg and normal cardiac output.   Recommendations: Agree with surgical consultation for aortic valve replacement.  The aortic valve appears to be bicuspid on echo.  Consider CT  chest to evaluate the ascending aorta.     Impression:   This 53 year old woman has stage D, severe, symptomatic aortic stenosis with NYHA class II symptoms of exertional fatigue and shortness of breath consistent with chronic diastolic congestive heart failure.  Her echo shows an indeterminate number of cusps and is likely bicuspid given her age.  I agree that aortic valve replacement is indicated for relief of her symptoms and to prevent progressive left ventricular dysfunction.  Her cardiac catheterization shows no coronary disease.  Her aortic root and ascending aorta appear to be a fairly normal size on echocardiogram so I do not think a CT scan is necessary.  I discussed the alternatives of bioprosthetic and mechanical valves with her including the pros and cons of both.  She would like to think about that choice.  I have reviewed the echo and cardiac catheterization images with her and answered all of her questions. I discussed the operative procedure with the patient including alternatives, benefits and risks; including but not limited to bleeding, blood transfusion, infection, stroke, myocardial infarction, graft failure, heart block requiring a permanent pacemaker, organ dysfunction, and death.  [name] understands and agrees to proceed.      Plan:   Aortic valve replacement.  She will think about the valve choice and let us  know prior to surgery.       Dorise MARLA Fellers, MD [facility] [phone]

## 2024-05-02 NOTE — Discharge Instructions (Signed)

## 2024-05-02 NOTE — Anesthesia Preprocedure Evaluation (Addendum)
 Anesthesia Evaluation  Patient identified by MRN, date of birth, ID band Patient awake    Reviewed: Allergy & Precautions, NPO status , Patient's Chart, lab work & pertinent test results  History of Anesthesia Complications (+) DIFFICULT AIRWAY and history of anesthetic complications  Airway Mallampati: II  TM Distance: >3 FB Neck ROM: Full    Dental  (+) Dental Advisory Given, Missing,    Pulmonary neg pulmonary ROS   Pulmonary exam normal breath sounds clear to auscultation       Cardiovascular Normal cardiovascular exam+ Valvular Problems/Murmurs AS and MVP  Rhythm:Regular Rate:Normal  Echo 03/28/24:  1. Left ventricular ejection fraction, by estimation, is 60 to 65%. The  left ventricle has normal function. The left ventricle has no regional  wall motion abnormalities. Left ventricular diastolic parameters are  indeterminate.   2. Right ventricular systolic function is normal. The right ventricular  size is normal.   3. The mitral valve is normal in structure. Mild mitral valve  regurgitation. No evidence of mitral stenosis.   4. The aortic valve has an indeterminant number of cusps. Aortic valve  regurgitation is not visualized. Severe aortic valve stenosis. Aortic  valve mean gradient measures 39.0 mmHg. Aortic valve Vmax measures 4.00  m/s.   5. The inferior vena cava is normal in size with greater than 50%  respiratory variability, suggesting right atrial pressure of 3 mmHg.     Neuro/Psych  Headaches  negative psych ROS   GI/Hepatic negative GI ROS, Neg liver ROS,,,  Endo/Other  negative endocrine ROS    Renal/GU negative Renal ROS     Musculoskeletal negative musculoskeletal ROS (+)    Abdominal   Peds  Hematology negative hematology ROS (+)   Anesthesia Other Findings   Reproductive/Obstetrics                              Anesthesia Physical Anesthesia Plan  ASA:  4  Anesthesia Plan: General   Post-op Pain Management:    Induction: Intravenous  PONV Risk Score and Plan: 3 and Midazolam  and Treatment may vary due to age or medical condition  Airway Management Planned: Oral ETT and Video Laryngoscope Planned  Additional Equipment: Ultrasound Guidance Line Placement, TEE, PA Cath, CVP, Arterial line and 3D TEE  Intra-op Plan:   Post-operative Plan: Post-operative intubation/ventilation  Informed Consent: I have reviewed the patients History and Physical, chart, labs and discussed the procedure including the risks, benefits and alternatives for the proposed anesthesia with the patient or authorized representative who has indicated his/her understanding and acceptance.     Dental advisory given  Plan Discussed with: CRNA  Anesthesia Plan Comments: (Difficult airway per anesthesia intubation note 01/29/24: Difficulty Due To: Difficult Airway- due to anterior larynx Comments: Anterior; retrognathic; use McGrath in future. Easy mask airway. -CA, CRNA )         Anesthesia Quick Evaluation

## 2024-05-03 ENCOUNTER — Inpatient Hospital Stay (HOSPITAL_COMMUNITY): Payer: Self-pay | Admitting: Vascular Surgery

## 2024-05-03 ENCOUNTER — Inpatient Hospital Stay (HOSPITAL_COMMUNITY): Admission: RE | Disposition: A | Payer: Self-pay | Source: Home / Self Care | Attending: Surgery

## 2024-05-03 ENCOUNTER — Inpatient Hospital Stay (HOSPITAL_COMMUNITY)

## 2024-05-03 ENCOUNTER — Encounter (HOSPITAL_COMMUNITY): Payer: Self-pay | Admitting: Surgery

## 2024-05-03 ENCOUNTER — Inpatient Hospital Stay (HOSPITAL_COMMUNITY): Payer: Self-pay | Admitting: Anesthesiology

## 2024-05-03 ENCOUNTER — Inpatient Hospital Stay (HOSPITAL_COMMUNITY)
Admission: RE | Admit: 2024-05-03 | Discharge: 2024-05-10 | DRG: 219 | Disposition: A | Discharge status: Home / Self Care | Attending: Surgery | Admitting: Surgery

## 2024-05-03 DIAGNOSIS — G43909 Migraine, unspecified, not intractable, without status migrainosus: Secondary | ICD-10-CM | POA: Diagnosis present

## 2024-05-03 DIAGNOSIS — D62 Acute posthemorrhagic anemia: Secondary | ICD-10-CM | POA: Diagnosis not present

## 2024-05-03 DIAGNOSIS — R110 Nausea: Secondary | ICD-10-CM | POA: Diagnosis not present

## 2024-05-03 DIAGNOSIS — I350 Nonrheumatic aortic (valve) stenosis: Secondary | ICD-10-CM | POA: Diagnosis not present

## 2024-05-03 DIAGNOSIS — J90 Pleural effusion, not elsewhere classified: Secondary | ICD-10-CM | POA: Diagnosis not present

## 2024-05-03 DIAGNOSIS — Z79899 Other long term (current) drug therapy: Secondary | ICD-10-CM

## 2024-05-03 DIAGNOSIS — I480 Paroxysmal atrial fibrillation: Secondary | ICD-10-CM | POA: Diagnosis present

## 2024-05-03 DIAGNOSIS — Z7982 Long term (current) use of aspirin: Secondary | ICD-10-CM

## 2024-05-03 DIAGNOSIS — Z978 Presence of other specified devices: Secondary | ICD-10-CM | POA: Diagnosis not present

## 2024-05-03 DIAGNOSIS — J939 Pneumothorax, unspecified: Secondary | ICD-10-CM | POA: Diagnosis not present

## 2024-05-03 DIAGNOSIS — I080 Rheumatic disorders of both mitral and aortic valves: Secondary | ICD-10-CM | POA: Diagnosis present

## 2024-05-03 DIAGNOSIS — I358 Other nonrheumatic aortic valve disorders: Secondary | ICD-10-CM | POA: Diagnosis not present

## 2024-05-03 DIAGNOSIS — Q2381 Bicuspid aortic valve: Secondary | ICD-10-CM | POA: Diagnosis not present

## 2024-05-03 DIAGNOSIS — B370 Candidal stomatitis: Secondary | ICD-10-CM | POA: Diagnosis not present

## 2024-05-03 DIAGNOSIS — D689 Coagulation defect, unspecified: Secondary | ICD-10-CM | POA: Diagnosis not present

## 2024-05-03 DIAGNOSIS — D696 Thrombocytopenia, unspecified: Secondary | ICD-10-CM | POA: Diagnosis not present

## 2024-05-03 DIAGNOSIS — Z4682 Encounter for fitting and adjustment of non-vascular catheter: Secondary | ICD-10-CM | POA: Diagnosis not present

## 2024-05-03 DIAGNOSIS — Z8249 Family history of ischemic heart disease and other diseases of the circulatory system: Secondary | ICD-10-CM

## 2024-05-03 DIAGNOSIS — Z953 Presence of xenogenic heart valve: Secondary | ICD-10-CM

## 2024-05-03 DIAGNOSIS — I5032 Chronic diastolic (congestive) heart failure: Secondary | ICD-10-CM | POA: Diagnosis not present

## 2024-05-03 DIAGNOSIS — Z452 Encounter for adjustment and management of vascular access device: Secondary | ICD-10-CM | POA: Diagnosis not present

## 2024-05-03 DIAGNOSIS — Z952 Presence of prosthetic heart valve: Secondary | ICD-10-CM | POA: Diagnosis not present

## 2024-05-03 DIAGNOSIS — Z48812 Encounter for surgical aftercare following surgery on the circulatory system: Secondary | ICD-10-CM | POA: Diagnosis not present

## 2024-05-03 DIAGNOSIS — R918 Other nonspecific abnormal finding of lung field: Secondary | ICD-10-CM | POA: Diagnosis not present

## 2024-05-03 DIAGNOSIS — J9600 Acute respiratory failure, unspecified whether with hypoxia or hypercapnia: Secondary | ICD-10-CM | POA: Diagnosis not present

## 2024-05-03 DIAGNOSIS — R0989 Other specified symptoms and signs involving the circulatory and respiratory systems: Secondary | ICD-10-CM | POA: Diagnosis not present

## 2024-05-03 DIAGNOSIS — Z803 Family history of malignant neoplasm of breast: Secondary | ICD-10-CM | POA: Diagnosis not present

## 2024-05-03 DIAGNOSIS — J9811 Atelectasis: Secondary | ICD-10-CM | POA: Diagnosis not present

## 2024-05-03 HISTORY — PX: AORTIC VALVE REPLACEMENT: SHX41

## 2024-05-03 HISTORY — PX: INTRAOPERATIVE TRANSESOPHAGEAL ECHOCARDIOGRAM: SHX5062

## 2024-05-03 LAB — POCT I-STAT, CHEM 8
BUN: 11 mg/dL (ref 6–20)
BUN: 11 mg/dL (ref 6–20)
BUN: 11 mg/dL (ref 6–20)
BUN: 11 mg/dL (ref 6–20)
Calcium, Ion: 1.2 mmol/L (ref 1.15–1.40)
Calcium, Ion: 1.2 mmol/L (ref 1.15–1.40)
Calcium, Ion: 1.04 mmol/L — ABNORMAL LOW (ref 1.15–1.40)
Calcium, Ion: 1.02 mmol/L — ABNORMAL LOW (ref 1.15–1.40)
Chloride: 104 mmol/L (ref 98–111)
Chloride: 104 mmol/L (ref 98–111)
Chloride: 103 mmol/L (ref 98–111)
Chloride: 101 mmol/L (ref 98–111)
Creatinine, Ser: 0.6 mg/dL (ref 0.44–1.00)
Creatinine, Ser: 0.5 mg/dL (ref 0.44–1.00)
Creatinine, Ser: 0.6 mg/dL (ref 0.44–1.00)
Creatinine, Ser: 0.6 mg/dL (ref 0.44–1.00)
Glucose, Bld: 104 mg/dL — ABNORMAL HIGH (ref 70–99)
Glucose, Bld: 133 mg/dL — ABNORMAL HIGH (ref 70–99)
Glucose, Bld: 149 mg/dL — ABNORMAL HIGH (ref 70–99)
Glucose, Bld: 159 mg/dL — ABNORMAL HIGH (ref 70–99)
HCT: 35 % — ABNORMAL LOW (ref 36.0–46.0)
HCT: 33 % — ABNORMAL LOW (ref 36.0–46.0)
HCT: 26 % — ABNORMAL LOW (ref 36.0–46.0)
HCT: 28 % — ABNORMAL LOW (ref 36.0–46.0)
Hemoglobin: 11.9 g/dL — ABNORMAL LOW (ref 12.0–15.0)
Hemoglobin: 11.2 g/dL — ABNORMAL LOW (ref 12.0–15.0)
Hemoglobin: 8.8 g/dL — ABNORMAL LOW (ref 12.0–15.0)
Hemoglobin: 9.5 g/dL — ABNORMAL LOW (ref 12.0–15.0)
Potassium: 3.6 mmol/L (ref 3.5–5.1)
Potassium: 3.6 mmol/L (ref 3.5–5.1)
Potassium: 3.6 mmol/L (ref 3.5–5.1)
Potassium: 3.7 mmol/L (ref 3.5–5.1)
Sodium: 140 mmol/L (ref 135–145)
Sodium: 140 mmol/L (ref 135–145)
Sodium: 139 mmol/L (ref 135–145)
Sodium: 137 mmol/L (ref 135–145)
TCO2: 27 mmol/L (ref 22–32)
TCO2: 27 mmol/L (ref 22–32)
TCO2: 27 mmol/L (ref 22–32)
TCO2: 24 mmol/L (ref 22–32)

## 2024-05-03 LAB — POCT I-STAT EG7
Acid-Base Excess: 2 mmol/L (ref 0.0–2.0)
Bicarbonate: 26.9 mmol/L (ref 20.0–28.0)
Calcium, Ion: 1.05 mmol/L — ABNORMAL LOW (ref 1.15–1.40)
HCT: 26 % — ABNORMAL LOW (ref 36.0–46.0)
Hemoglobin: 8.8 g/dL — ABNORMAL LOW (ref 12.0–15.0)
O2 Saturation: 84 %
Potassium: 3.7 mmol/L (ref 3.5–5.1)
Sodium: 141 mmol/L (ref 135–145)
TCO2: 28 mmol/L (ref 22–32)
pCO2, Ven: 43.6 mmHg — ABNORMAL LOW (ref 44–60)
pH, Ven: 7.399 (ref 7.25–7.43)
pO2, Ven: 48 mmHg — ABNORMAL HIGH (ref 32–45)

## 2024-05-03 LAB — CBC
HCT: 35.3 % — ABNORMAL LOW (ref 36.0–46.0)
HCT: 31.8 % — ABNORMAL LOW (ref 36.0–46.0)
Hemoglobin: 11.6 g/dL — ABNORMAL LOW (ref 12.0–15.0)
Hemoglobin: 10.5 g/dL — ABNORMAL LOW (ref 12.0–15.0)
MCH: 29 pg (ref 26.0–34.0)
MCH: 29.7 pg (ref 26.0–34.0)
MCHC: 32.9 g/dL (ref 30.0–36.0)
MCHC: 33 g/dL (ref 30.0–36.0)
MCV: 88.3 fL (ref 80.0–100.0)
MCV: 89.8 fL (ref 80.0–100.0)
Platelets: 240 K/uL (ref 150–400)
Platelets: 192 K/uL (ref 150–400)
RBC: 4 MIL/uL (ref 3.87–5.11)
RBC: 3.54 MIL/uL — ABNORMAL LOW (ref 3.87–5.11)
RDW: 12.5 % (ref 11.5–15.5)
RDW: 12.6 % (ref 11.5–15.5)
WBC: 16.5 K/uL — ABNORMAL HIGH (ref 4.0–10.5)
WBC: 13.1 K/uL — ABNORMAL HIGH (ref 4.0–10.5)
nRBC: 0 % (ref 0.0–0.2)
nRBC: 0 % (ref 0.0–0.2)

## 2024-05-03 LAB — POCT I-STAT 7, (LYTES, BLD GAS, ICA,H+H)
Acid-Base Excess: 2 mmol/L (ref 0.0–2.0)
Acid-Base Excess: 0 mmol/L (ref 0.0–2.0)
Acid-base deficit: 4 mmol/L — ABNORMAL HIGH (ref 0.0–2.0)
Acid-base deficit: 3 mmol/L — ABNORMAL HIGH (ref 0.0–2.0)
Acid-base deficit: 3 mmol/L — ABNORMAL HIGH (ref 0.0–2.0)
Bicarbonate: 26.6 mmol/L (ref 20.0–28.0)
Bicarbonate: 24.1 mmol/L (ref 20.0–28.0)
Bicarbonate: 21.9 mmol/L (ref 20.0–28.0)
Bicarbonate: 23.4 mmol/L (ref 20.0–28.0)
Bicarbonate: 23.1 mmol/L (ref 20.0–28.0)
Calcium, Ion: 1 mmol/L — ABNORMAL LOW (ref 1.15–1.40)
Calcium, Ion: 1.03 mmol/L — ABNORMAL LOW (ref 1.15–1.40)
Calcium, Ion: 1.13 mmol/L — ABNORMAL LOW (ref 1.15–1.40)
Calcium, Ion: 1.11 mmol/L — ABNORMAL LOW (ref 1.15–1.40)
Calcium, Ion: 1.11 mmol/L — ABNORMAL LOW (ref 1.15–1.40)
HCT: 25 % — ABNORMAL LOW (ref 36.0–46.0)
HCT: 24 % — ABNORMAL LOW (ref 36.0–46.0)
HCT: 31 % — ABNORMAL LOW (ref 36.0–46.0)
HCT: 30 % — ABNORMAL LOW (ref 36.0–46.0)
HCT: 29 % — ABNORMAL LOW (ref 36.0–46.0)
Hemoglobin: 8.5 g/dL — ABNORMAL LOW (ref 12.0–15.0)
Hemoglobin: 8.2 g/dL — ABNORMAL LOW (ref 12.0–15.0)
Hemoglobin: 10.5 g/dL — ABNORMAL LOW (ref 12.0–15.0)
Hemoglobin: 10.2 g/dL — ABNORMAL LOW (ref 12.0–15.0)
Hemoglobin: 9.9 g/dL — ABNORMAL LOW (ref 12.0–15.0)
O2 Saturation: 100 %
O2 Saturation: 100 %
O2 Saturation: 100 %
O2 Saturation: 100 %
O2 Saturation: 99 %
Patient temperature: 36.7
Patient temperature: 36.7
Patient temperature: 37.6
Potassium: 3.9 mmol/L (ref 3.5–5.1)
Potassium: 3.7 mmol/L (ref 3.5–5.1)
Potassium: 3.6 mmol/L (ref 3.5–5.1)
Potassium: 4.4 mmol/L (ref 3.5–5.1)
Potassium: 4 mmol/L (ref 3.5–5.1)
Sodium: 140 mmol/L (ref 135–145)
Sodium: 137 mmol/L (ref 135–145)
Sodium: 140 mmol/L (ref 135–145)
Sodium: 142 mmol/L (ref 135–145)
Sodium: 142 mmol/L (ref 135–145)
TCO2: 28 mmol/L (ref 22–32)
TCO2: 25 mmol/L (ref 22–32)
TCO2: 23 mmol/L (ref 22–32)
TCO2: 25 mmol/L (ref 22–32)
TCO2: 25 mmol/L (ref 22–32)
pCO2 arterial: 42.9 mmHg (ref 32–48)
pCO2 arterial: 34.6 mmHg (ref 32–48)
pCO2 arterial: 41.9 mmHg (ref 32–48)
pCO2 arterial: 44.5 mmHg (ref 32–48)
pCO2 arterial: 48.8 mmHg — ABNORMAL HIGH (ref 32–48)
pH, Arterial: 7.402 (ref 7.35–7.45)
pH, Arterial: 7.451 — ABNORMAL HIGH (ref 7.35–7.45)
pH, Arterial: 7.324 — ABNORMAL LOW (ref 7.35–7.45)
pH, Arterial: 7.326 — ABNORMAL LOW (ref 7.35–7.45)
pH, Arterial: 7.286 — ABNORMAL LOW (ref 7.35–7.45)
pO2, Arterial: 339 mmHg — ABNORMAL HIGH (ref 83–108)
pO2, Arterial: 390 mmHg — ABNORMAL HIGH (ref 83–108)
pO2, Arterial: 210 mmHg — ABNORMAL HIGH (ref 83–108)
pO2, Arterial: 193 mmHg — ABNORMAL HIGH (ref 83–108)
pO2, Arterial: 146 mmHg — ABNORMAL HIGH (ref 83–108)

## 2024-05-03 LAB — PLATELET COUNT: Platelets: 261 K/uL (ref 150–400)

## 2024-05-03 LAB — GLUCOSE, CAPILLARY
Glucose-Capillary: 111 mg/dL — ABNORMAL HIGH (ref 70–99)
Glucose-Capillary: 154 mg/dL — ABNORMAL HIGH (ref 70–99)
Glucose-Capillary: 132 mg/dL — ABNORMAL HIGH (ref 70–99)
Glucose-Capillary: 134 mg/dL — ABNORMAL HIGH (ref 70–99)
Glucose-Capillary: 134 mg/dL — ABNORMAL HIGH (ref 70–99)
Glucose-Capillary: 128 mg/dL — ABNORMAL HIGH (ref 70–99)
Glucose-Capillary: 137 mg/dL — ABNORMAL HIGH (ref 70–99)
Glucose-Capillary: 143 mg/dL — ABNORMAL HIGH (ref 70–99)

## 2024-05-03 LAB — BASIC METABOLIC PANEL WITH GFR
Anion gap: 6 (ref 5–15)
BUN: 10 mg/dL (ref 6–20)
CO2: 24 mmol/L (ref 22–32)
Calcium: 7.2 mg/dL — ABNORMAL LOW (ref 8.9–10.3)
Chloride: 109 mmol/L (ref 98–111)
Creatinine, Ser: 0.62 mg/dL (ref 0.44–1.00)
GFR, Estimated: >60 mL/min (ref >60)
Glucose, Bld: 143 mg/dL — ABNORMAL HIGH (ref 70–99)
Potassium: 4 mmol/L (ref 3.5–5.1)
Sodium: 139 mmol/L (ref 135–145)

## 2024-05-03 LAB — ABO/RH: ABO/RH(D): A POS

## 2024-05-03 LAB — HEMOGLOBIN AND HEMATOCRIT, BLOOD
HCT: 25.6 % — ABNORMAL LOW (ref 36.0–46.0)
Hemoglobin: 8.6 g/dL — ABNORMAL LOW (ref 12.0–15.0)

## 2024-05-03 LAB — PROTIME-INR
INR: 1.4 — ABNORMAL HIGH (ref 0.8–1.2)
Prothrombin Time: 17.8 s — ABNORMAL HIGH (ref 11.4–15.2)

## 2024-05-03 LAB — MAGNESIUM: Magnesium: 3.1 mg/dL — ABNORMAL HIGH (ref 1.7–2.4)

## 2024-05-03 LAB — APTT: aPTT: 37 s — ABNORMAL HIGH (ref 24–36)

## 2024-05-03 SURGERY — REPLACEMENT, AORTIC VALVE, OPEN
Anesthesia: General | Site: Chest

## 2024-05-03 MED ORDER — FENTANYL CITRATE (PF) 100 MCG/2ML IJ SOLN
INTRAMUSCULAR | Status: DC | PRN
  Administered 2024-05-03: 50 ug via INTRAVENOUS
  Administered 2024-05-03 (×3): 100 ug via INTRAVENOUS
  Administered 2024-05-03: 200 ug via INTRAVENOUS
  Administered 2024-05-03: 100 ug via INTRAVENOUS

## 2024-05-03 MED ORDER — CHLORHEXIDINE GLUCONATE 0.12 % MT SOLN
15.0000 mL | Freq: Once | OROMUCOSAL | Status: AC
  Administered 2024-05-03: 15 mL via OROMUCOSAL
  Filled 2024-05-03: qty 15

## 2024-05-03 MED ORDER — DOCUSATE SODIUM 100 MG PO CAPS
200.0000 mg | ORAL_CAPSULE | Freq: Every day | ORAL | Status: DC
  Administered 2024-05-04 – 2024-05-06 (×3): 200 mg via ORAL
  Filled 2024-05-03 (×4): qty 2

## 2024-05-03 MED ORDER — ONDANSETRON HCL 4 MG/2ML IJ SOLN
INTRAMUSCULAR | Status: DC | PRN
  Administered 2024-05-03: 4 mg via INTRAVENOUS

## 2024-05-03 MED ORDER — ASPIRIN 81 MG PO CHEW: 324.0000 mg | CHEWABLE_TABLET | Freq: Every day | ORAL | Status: DC

## 2024-05-03 MED ORDER — MORPHINE SULFATE (PF) 2 MG/ML IV SOLN
1.0000 mg | INTRAVENOUS | Status: DC | PRN
  Administered 2024-05-03: 1 mg via INTRAVENOUS
  Filled 2024-05-03: qty 1

## 2024-05-03 MED ORDER — EPINEPHRINE HCL 5 MG/250ML IV SOLN IN NS: 0.0000 ug/min | INTRAVENOUS | Status: DC

## 2024-05-03 MED ORDER — OXYCODONE HCL 5 MG PO TABS
5.0000 mg | ORAL_TABLET | ORAL | Status: DC | PRN
  Administered 2024-05-03: 5 mg via ORAL
  Administered 2024-05-03 – 2024-05-09 (×4): 10 mg via ORAL
  Filled 2024-05-03 (×3): qty 2
  Filled 2024-05-03 (×3): qty 1

## 2024-05-03 MED ORDER — ONDANSETRON HCL 4 MG/2ML IJ SOLN
4.0000 mg | Freq: Four times a day (QID) | INTRAMUSCULAR | Status: DC | PRN
  Administered 2024-05-03 – 2024-05-05 (×4): 4 mg via INTRAVENOUS
  Filled 2024-05-03 (×4): qty 2

## 2024-05-03 MED ORDER — METOCLOPRAMIDE HCL 5 MG/ML IJ SOLN
10.0000 mg | Freq: Four times a day (QID) | INTRAMUSCULAR | Status: AC
  Administered 2024-05-03 – 2024-05-04 (×6): 10 mg via INTRAVENOUS
  Filled 2024-05-03 (×6): qty 2

## 2024-05-03 MED ORDER — CHLORHEXIDINE GLUCONATE CLOTH 2 % EX PADS
6.0000 | MEDICATED_PAD | Freq: Every day | CUTANEOUS | Status: DC
  Administered 2024-05-03 – 2024-05-09 (×6): 6 via TOPICAL

## 2024-05-03 MED ORDER — CEFAZOLIN SODIUM-DEXTROSE 2-4 GM/100ML-% IV SOLN
2.0000 g | Freq: Three times a day (TID) | INTRAVENOUS | Status: AC
  Administered 2024-05-03 – 2024-05-05 (×6): 2 g via INTRAVENOUS
  Filled 2024-05-03 (×6): qty 100

## 2024-05-03 MED ORDER — ORAL CARE MOUTH RINSE: 15.0000 mL | OROMUCOSAL | Status: DC | PRN

## 2024-05-03 MED ORDER — ACETAMINOPHEN 160 MG/5ML PO SOLN: 1000.0000 mg | Freq: Four times a day (QID) | ORAL | Status: DC

## 2024-05-03 MED ORDER — ACETAMINOPHEN 500 MG PO TABS
1000.0000 mg | ORAL_TABLET | Freq: Four times a day (QID) | ORAL | Status: AC
  Administered 2024-05-04 – 2024-05-08 (×19): 1000 mg via ORAL
  Filled 2024-05-03 (×19): qty 2

## 2024-05-03 MED ORDER — ASPIRIN 325 MG PO TBEC
325.0000 mg | DELAYED_RELEASE_TABLET | Freq: Every day | ORAL | Status: DC
  Administered 2024-05-04 – 2024-05-10 (×7): 325 mg via ORAL
  Filled 2024-05-03 (×7): qty 1

## 2024-05-03 MED ORDER — HEPARIN SODIUM (PORCINE) 1000 UNIT/ML IJ SOLN
INTRAMUSCULAR | Status: DC | PRN
  Administered 2024-05-03: 22000 [IU] via INTRAVENOUS

## 2024-05-03 MED ORDER — LACTATED RINGERS IV SOLN: INTRAVENOUS | Status: DC

## 2024-05-03 MED ORDER — SODIUM CHLORIDE 0.9 % IV SOLN: INTRAVENOUS | Status: DC

## 2024-05-03 MED ORDER — CHLORHEXIDINE GLUCONATE 4 % EX SOLN: 30.0000 mL | CUTANEOUS | Status: DC

## 2024-05-03 MED ORDER — FENTANYL CITRATE (PF) 250 MCG/5ML IJ SOLN
INTRAMUSCULAR | Status: AC
  Filled 2024-05-03: qty 5

## 2024-05-03 MED ORDER — PROPOFOL 10 MG/ML IV BOLUS
INTRAVENOUS | Status: AC
  Filled 2024-05-03: qty 20

## 2024-05-03 MED ORDER — POTASSIUM CHLORIDE 10 MEQ/50ML IV SOLN
10.0000 meq | INTRAVENOUS | Status: AC
  Administered 2024-05-03 (×3): 10 meq via INTRAVENOUS

## 2024-05-03 MED ORDER — THROMBIN 20000 UNITS EX KIT
PACK | CUTANEOUS | Status: DC | PRN
  Administered 2024-05-03: 20000 [IU] via TOPICAL

## 2024-05-03 MED ORDER — LIDOCAINE 2% (20 MG/ML) 5 ML SYRINGE
INTRAMUSCULAR | Status: DC | PRN
  Administered 2024-05-03: 80 mg via INTRAVENOUS

## 2024-05-03 MED ORDER — ALBUMIN HUMAN 5 % IV SOLN
250.0000 mL | INTRAVENOUS | Status: DC | PRN
  Administered 2024-05-03 (×4): 12.5 g via INTRAVENOUS
  Filled 2024-05-03 (×3): qty 250

## 2024-05-03 MED ORDER — ~~LOC~~ CARDIAC SURGERY, PATIENT & FAMILY EDUCATION
Freq: Once | Status: DC
  Filled 2024-05-03: qty 1

## 2024-05-03 MED ORDER — ORAL CARE MOUTH RINSE
15.0000 mL | OROMUCOSAL | Status: DC
  Administered 2024-05-03 (×3): 15 mL via OROMUCOSAL

## 2024-05-03 MED ORDER — MIDAZOLAM HCL 5 MG/5ML IJ SOLN
INTRAMUSCULAR | Status: DC | PRN
  Administered 2024-05-03 (×4): 1 mg via INTRAVENOUS

## 2024-05-03 MED ORDER — SODIUM CHLORIDE 0.45 % IV SOLN: INTRAVENOUS | Status: DC | PRN

## 2024-05-03 MED ORDER — BISACODYL 10 MG RE SUPP: 10.0000 mg | Freq: Every day | RECTAL | Status: DC

## 2024-05-03 MED ORDER — HEMOSTATIC AGENTS (NO CHARGE) OPTIME
TOPICAL | Status: DC | PRN
  Administered 2024-05-03: 1 via TOPICAL

## 2024-05-03 MED ORDER — ACETAMINOPHEN 160 MG/5ML PO SOLN: 650.0000 mg | Freq: Once | ORAL | Status: DC

## 2024-05-03 MED ORDER — PANTOPRAZOLE SODIUM 40 MG IV SOLR
40.0000 mg | Freq: Every day | INTRAVENOUS | Status: AC
  Administered 2024-05-03 – 2024-05-04 (×2): 40 mg via INTRAVENOUS
  Filled 2024-05-03 (×2): qty 10

## 2024-05-03 MED ORDER — LACTATED RINGERS IV SOLN: INTRAVENOUS | Status: DC | PRN

## 2024-05-03 MED ORDER — CYCLOBENZAPRINE HCL 5 MG PO TABS
5.0000 mg | ORAL_TABLET | Freq: Three times a day (TID) | ORAL | Status: DC | PRN
  Administered 2024-05-06: 5 mg via ORAL
  Filled 2024-05-03 (×2): qty 1

## 2024-05-03 MED ORDER — METOPROLOL TARTRATE 12.5 MG HALF TABLET
12.5000 mg | ORAL_TABLET | Freq: Once | ORAL | Status: AC
  Administered 2024-05-03: 12.5 mg via ORAL
  Filled 2024-05-03: qty 1

## 2024-05-03 MED ORDER — CYCLOBENZAPRINE HCL 5 MG PO TABS: 5.0000 mg | ORAL_TABLET | Freq: Three times a day (TID) | ORAL | Status: DC | PRN

## 2024-05-03 MED ORDER — METOPROLOL TARTRATE 5 MG/5ML IV SOLN: 2.5000 mg | INTRAVENOUS | Status: DC | PRN

## 2024-05-03 MED ORDER — PROPOFOL 10 MG/ML IV BOLUS
INTRAVENOUS | Status: DC | PRN
  Administered 2024-05-03: 80 mg via INTRAVENOUS

## 2024-05-03 MED ORDER — ROCURONIUM BROMIDE 10 MG/ML (PF) SYRINGE
PREFILLED_SYRINGE | INTRAVENOUS | Status: DC | PRN
  Administered 2024-05-03: 100 mg via INTRAVENOUS
  Administered 2024-05-03: 30 mg via INTRAVENOUS
  Administered 2024-05-03: 50 mg via INTRAVENOUS

## 2024-05-03 MED ORDER — PANTOPRAZOLE SODIUM 40 MG PO TBEC
40.0000 mg | DELAYED_RELEASE_TABLET | Freq: Every day | ORAL | Status: DC
  Administered 2024-05-05 – 2024-05-10 (×6): 40 mg via ORAL
  Filled 2024-05-03 (×6): qty 1

## 2024-05-03 MED ORDER — PHENYLEPHRINE 80 MCG/ML (10ML) SYRINGE FOR IV PUSH (FOR BLOOD PRESSURE SUPPORT)
PREFILLED_SYRINGE | INTRAVENOUS | Status: DC | PRN
  Administered 2024-05-03: 120 ug via INTRAVENOUS

## 2024-05-03 MED ORDER — ALBUMIN HUMAN 5 % IV SOLN: INTRAVENOUS | Status: DC | PRN

## 2024-05-03 MED ORDER — NITROGLYCERIN IN D5W 200-5 MCG/ML-% IV SOLN: 0.0000 ug/min | INTRAVENOUS | Status: DC

## 2024-05-03 MED ORDER — VANCOMYCIN HCL IN DEXTROSE 1-5 GM/200ML-% IV SOLN
1000.0000 mg | Freq: Once | INTRAVENOUS | Status: AC
  Administered 2024-05-03: 1000 mg via INTRAVENOUS
  Filled 2024-05-03: qty 200

## 2024-05-03 MED ORDER — TRAMADOL HCL 50 MG PO TABS
50.0000 mg | ORAL_TABLET | ORAL | Status: DC | PRN
  Administered 2024-05-04 (×3): 100 mg via ORAL
  Filled 2024-05-03 (×3): qty 2

## 2024-05-03 MED ORDER — METOPROLOL TARTRATE 12.5 MG HALF TABLET: 12.5000 mg | ORAL_TABLET | Freq: Two times a day (BID) | ORAL | Status: DC

## 2024-05-03 MED ORDER — THROMBIN 20000 UNITS EX SOLR
OROMUCOSAL | Status: DC | PRN
  Administered 2024-05-03 (×3): 4 mL via TOPICAL

## 2024-05-03 MED ORDER — SODIUM CHLORIDE 0.9 % IV SOLN: 250.0000 mL | INTRAVENOUS | Status: AC

## 2024-05-03 MED ORDER — PHENYLEPHRINE HCL-NACL 20-0.9 MG/250ML-% IV SOLN: 0.0000 ug/min | INTRAVENOUS | Status: DC

## 2024-05-03 MED ORDER — SODIUM CHLORIDE 0.9% FLUSH
3.0000 mL | Freq: Two times a day (BID) | INTRAVENOUS | Status: DC
  Administered 2024-05-04 – 2024-05-10 (×9): 3 mL via INTRAVENOUS

## 2024-05-03 MED ORDER — SODIUM CHLORIDE 0.9% FLUSH: 3.0000 mL | INTRAVENOUS | Status: DC | PRN

## 2024-05-03 MED ORDER — DEXMEDETOMIDINE HCL IN NACL 400 MCG/100ML IV SOLN: 0.0000 ug/kg/h | INTRAVENOUS | Status: DC

## 2024-05-03 MED ORDER — PHENYLEPHRINE HCL (PRESSORS) 10 MG/ML IV SOLN
INTRAVENOUS | Status: DC | PRN
  Administered 2024-05-03 (×2): 160 ug via INTRAVENOUS
  Administered 2024-05-03: 40 ug via INTRAVENOUS

## 2024-05-03 MED ORDER — ACETAMINOPHEN 325 MG PO TABS
650.0000 mg | ORAL_TABLET | Freq: Once | ORAL | Status: AC
  Administered 2024-05-03: 650 mg via ORAL
  Filled 2024-05-03: qty 2

## 2024-05-03 MED ORDER — INSULIN REGULAR(HUMAN) IN NACL 100-0.9 UT/100ML-% IV SOLN: INTRAVENOUS | Status: DC

## 2024-05-03 MED ORDER — ASPIRIN 81 MG PO CHEW
324.0000 mg | CHEWABLE_TABLET | Freq: Once | ORAL | Status: AC
  Administered 2024-05-03: 324 mg via ORAL
  Filled 2024-05-03: qty 4

## 2024-05-03 MED ORDER — MAGNESIUM SULFATE 4 GM/100ML IV SOLN
4.0000 g | Freq: Once | INTRAVENOUS | Status: AC
  Administered 2024-05-03: 4 g via INTRAVENOUS
  Filled 2024-05-03: qty 100

## 2024-05-03 MED ORDER — THROMBIN (RECOMBINANT) 20000 UNITS EX SOLR
CUTANEOUS | Status: AC
  Filled 2024-05-03: qty 20000

## 2024-05-03 MED ORDER — PLASMA-LYTE A IV SOLN
INTRAVENOUS | Status: DC | PRN
  Administered 2024-05-03: 500 mL

## 2024-05-03 MED ORDER — SODIUM CHLORIDE 0.9 % IV SOLN
12.5000 mg | Freq: Four times a day (QID) | INTRAVENOUS | Status: DC | PRN
  Administered 2024-05-03 – 2024-05-05 (×3): 12.5 mg via INTRAVENOUS
  Filled 2024-05-03: qty 0.5
  Filled 2024-05-03 (×3): qty 12.5

## 2024-05-03 MED ORDER — LACTATED RINGERS IV SOLN: INTRAVENOUS | Status: AC

## 2024-05-03 MED ORDER — SODIUM BICARBONATE 8.4 % IV SOLN
50.0000 meq | Freq: Once | INTRAVENOUS | Status: AC
  Administered 2024-05-03: 50 meq via INTRAVENOUS

## 2024-05-03 MED ORDER — BISACODYL 5 MG PO TBEC
10.0000 mg | DELAYED_RELEASE_TABLET | Freq: Every day | ORAL | Status: DC
  Administered 2024-05-04: 10 mg via ORAL
  Filled 2024-05-03: qty 2

## 2024-05-03 MED ORDER — CHLORHEXIDINE GLUCONATE 0.12 % MT SOLN
15.0000 mL | OROMUCOSAL | Status: AC
  Administered 2024-05-03: 15 mL via OROMUCOSAL
  Filled 2024-05-03: qty 15

## 2024-05-03 MED ORDER — MIDAZOLAM HCL (PF) 10 MG/2ML IJ SOLN
INTRAMUSCULAR | Status: AC
  Filled 2024-05-03: qty 2

## 2024-05-03 MED ORDER — METOPROLOL TARTRATE 25 MG/10 ML ORAL SUSPENSION: 12.5000 mg | Freq: Two times a day (BID) | ORAL | Status: DC

## 2024-05-03 MED ORDER — PROTAMINE SULFATE 10 MG/ML IV SOLN
INTRAVENOUS | Status: DC | PRN
  Administered 2024-05-03: 180 mg via INTRAVENOUS
  Administered 2024-05-03: 20 mg via INTRAVENOUS

## 2024-05-03 MED ORDER — DEXTROSE 50 % IV SOLN: 0.0000 mL | INTRAVENOUS | Status: DC | PRN

## 2024-05-03 MED ORDER — MIDAZOLAM HCL (PF) 2 MG/2ML IJ SOLN: 2.0000 mg | INTRAMUSCULAR | Status: DC | PRN

## 2024-05-03 SURGICAL SUPPLY — 65 items
ADAPTER CARDIO PERF ANTE/RETRO (ADAPTER) ×2 IMPLANT
BAG DECANTER FOR FLEXI CONT (MISCELLANEOUS) ×2 IMPLANT
BLADE CLIPPER SURG (BLADE) ×2 IMPLANT
BLADE STERNUM SYSTEM 6 (BLADE) ×2 IMPLANT
BLADE SURG 15 STRL LF DISP TIS (BLADE) ×2 IMPLANT
CANISTER SUCTION 3000ML PPV (SUCTIONS) ×2 IMPLANT
CANNULA AORTIC ROOT 9FR (CANNULA) ×2 IMPLANT
CANNULA ARTERIAL VENT 3/8 20FR (CANNULA) IMPLANT
CANNULA GUNDRY RCSP 15FR (MISCELLANEOUS) ×2 IMPLANT
CANNULA MC2 2 STG 36/46 CONN (CANNULA) IMPLANT
CATH HEART VENT LEFT (CATHETERS) ×2 IMPLANT
CATH ROBINSON RED A/P 18FR (CATHETERS) ×6 IMPLANT
CATH THORACIC 36FR (CATHETERS) ×2 IMPLANT
CATH THORACIC 36FR RT ANG (CATHETERS) ×2 IMPLANT
CNTNR URN SCR LID CUP LEK RST (MISCELLANEOUS) ×2 IMPLANT
CONTAINER PROTECT SURGISLUSH (MISCELLANEOUS) ×4 IMPLANT
COVER SURGICAL LIGHT HANDLE (MISCELLANEOUS) ×2 IMPLANT
DEVICE SUT CK QUICK LOAD MINI (Prosthesis & Implant Heart) IMPLANT
DRAPE SRG 135X102X78XABS (DRAPES) ×2 IMPLANT
DRAPE WARM FLUID 44X44 (DRAPES) ×2 IMPLANT
DRSG COVADERM 4X14 (GAUZE/BANDAGES/DRESSINGS) ×2 IMPLANT
ELECT CAUTERY BLADE 6.4 (BLADE) ×2 IMPLANT
ELECTRODE REM PT RTRN 9FT ADLT (ELECTROSURGICAL) ×4 IMPLANT
FELT TEFLON 1X6 (MISCELLANEOUS) ×4 IMPLANT
GAUZE SPONGE 4X4 12PLY STRL (GAUZE/BANDAGES/DRESSINGS) ×2 IMPLANT
GLOVE BIO SURGEON STRL SZ 6 (GLOVE) IMPLANT
GLOVE BIO SURGEON STRL SZ 6.5 (GLOVE) IMPLANT
GLOVE BIO SURGEON STRL SZ7 (GLOVE) IMPLANT
GLOVE BIO SURGEON STRL SZ7.5 (GLOVE) IMPLANT
GLOVE SURG MICRO LTX SZ7 (GLOVE) ×4 IMPLANT
GOWN STRL REUS W/ TWL LRG LVL3 (GOWN DISPOSABLE) ×8 IMPLANT
GOWN STRL REUS W/ TWL XL LVL3 (GOWN DISPOSABLE) ×2 IMPLANT
HEMOSTAT POWDER SURGIFOAM 1G (HEMOSTASIS) ×6 IMPLANT
HEMOSTAT SURGICEL 2X14 (HEMOSTASIS) ×2 IMPLANT
KIT BASIN OR (CUSTOM PROCEDURE TRAY) ×2 IMPLANT
KIT SUCTION CATH 14FR (SUCTIONS) IMPLANT
KIT SUT CK MINI COMBO 4X17 (Prosthesis & Implant Heart) IMPLANT
KIT TURNOVER KIT B (KITS) ×2 IMPLANT
LINE VENT (MISCELLANEOUS) IMPLANT
PACK E OPEN HEART (SUTURE) ×2 IMPLANT
PACK OPEN HEART (CUSTOM PROCEDURE TRAY) ×2 IMPLANT
PAD ARMBOARD POSITIONER FOAM (MISCELLANEOUS) ×4 IMPLANT
PENCIL BUTTON HOLSTER BLD 10FT (ELECTRODE) ×2 IMPLANT
POSITIONER HEAD DONUT 9IN (MISCELLANEOUS) ×2 IMPLANT
SET MPS 3-ND DEL (MISCELLANEOUS) IMPLANT
SOLN 0.9% NACL POUR BTL 1000ML (IV SOLUTION) ×12 IMPLANT
SOLN STERILE WATER BTL 1000 ML (IV SOLUTION) ×4 IMPLANT
SUT BONE WAX W31G (SUTURE) ×2 IMPLANT
SUT EB EXC GRN/WHT 2-0 V-5 (SUTURE) ×4 IMPLANT
SUT ETHIBON EXCEL 2-0 V-5 (SUTURE) IMPLANT
SUT ETHIBOND V-5 VALVE (SUTURE) IMPLANT
SUT PROLENE 3 0 SH DA (SUTURE) IMPLANT
SUT PROLENE 3 0 SH1 36 (SUTURE) ×2 IMPLANT
SUT PROLENE 4-0 RB1 .5 CRCL 36 (SUTURE) ×6 IMPLANT
SUT STEEL 6MS V (SUTURE) IMPLANT
SUT VIC AB 1 CTX36XBRD ANBCTR (SUTURE) ×4 IMPLANT
SYSTEM SAHARA CHEST DRAIN ATS (WOUND CARE) ×2 IMPLANT
TAPE CLOTH SURG 4X10 WHT LF (GAUZE/BANDAGES/DRESSINGS) IMPLANT
TAPE PAPER 2X10 WHT MICROPORE (GAUZE/BANDAGES/DRESSINGS) IMPLANT
TOWEL GREEN STERILE (TOWEL DISPOSABLE) ×2 IMPLANT
TOWEL GREEN STERILE FF (TOWEL DISPOSABLE) ×2 IMPLANT
TRAY FOLEY SLVR 14FR TEMP STAT (SET/KITS/TRAYS/PACK) IMPLANT
TUBE SUCT INTRACARD DLP 20F (MISCELLANEOUS) ×2 IMPLANT
UNDERPAD 30X36 HEAVY ABSORB (UNDERPADS AND DIAPERS) ×2 IMPLANT
VALVE AORTIC SZ21 INSP/RESIL (Valve) IMPLANT

## 2024-05-03 NOTE — Anesthesia Procedure Notes (Signed)
 Procedure Name: Intubation Date/Time: 05/03/2024 7:45 AM  Performed by: Lettie Derrek Dacosta, CRNAPre-anesthesia Checklist: Patient identified, Patient being monitored, Timeout performed, Emergency Drugs available and Suction available Patient Re-evaluated:Patient Re-evaluated prior to induction Oxygen Delivery Method: Circle System Utilized Preoxygenation: Pre-oxygenation with 100% oxygen Induction Type: IV induction Ventilation: Mask ventilation without difficulty Laryngoscope Size: 3 and Glidescope Grade View: Grade I Tube type: Oral Tube size: 8.0 mm Number of attempts: 1 Airway Equipment and Method: Stylet Placement Confirmation: ETT inserted through vocal cords under direct vision, positive ETCO2 and breath sounds checked- equal and bilateral Secured at: 23 cm Tube secured with: Tape Dental Injury: Teeth and Oropharynx as per pre-operative assessment  Difficulty Due To: Difficulty was anticipated, Difficult Airway- due to limited oral opening and Difficult Airway- due to anterior larynx

## 2024-05-03 NOTE — Brief Op Note (Signed)
 05/03/2024  10:21 AM  PATIENT:  [name]  53 y.o. female  PRE-OPERATIVE DIAGNOSIS:  SEVERE AORTIC STENOSIS  POST-OPERATIVE DIAGNOSIS:  SEVERE AORTIC STENOSIS  PROCEDURE:  INTRAOPERATIVE TRANSESOPHAGEAL ECHOCARDIOGRAM, MEDIAN STERNOTOMY for AORTIC VALVE REPLACEMENT (USING an INSPIRIS RESILIA AORTIC VALVE MODEL # 11500A, SERIAL # [id number], SIZE 21 MM)  SURGEON:  Surgeons and Role:    Lucas Dorise POUR, MD - Primary  PHYSICIAN ASSISTANT: Kyla Donald PA-C  ASSISTANTS: Luke Mayotte RNFA   ANESTHESIA:   general  EBL: Per anesthesia, perfusion record  DRAINS: Chest tubes placed in the mediastinal and pleural spaces   COUNTS CORRECT:  YES  DICTATION: .Dragon Dictation  PLAN OF CARE: Admit to inpatient   PATIENT DISPOSITION:  ICU - intubated and hemodynamically stable.   Delay start of Pharmacological VTE agent (>24hrs) due to surgical blood loss or risk of bleeding: no  BASELINE WEIGHT: 64.6 kg

## 2024-05-03 NOTE — Op Note (Signed)
 CARDIOVASCULAR SURGERY OPERATIVE NOTE  05/03/2024 [name] [id number]  Surgeon:  Dorise LOIS Fellers, MD  First Assistant: Kyla Donald,  PA-C: An experienced assistant was required given the complexity of this surgery and the standard of surgical care. The assistant was needed for exposure, dissection, suctioning, retraction of delicate tissues and sutures, instrument exchange and for overall help during this procedure.    Preoperative Diagnosis:  Severe bicuspid aortic valve stenosis   Postoperative Diagnosis:  Same   Procedure:  Median Sternotomy Extracorporeal circulation 3.   Aortic valve replacement using a 21 mm Edwards INSPIRIS RESILIA pericardial valve.  Anesthesia:  General Endotracheal   Clinical History/Surgical Indication:  This 53 year old woman has stage D, severe, symptomatic aortic stenosis with NYHA class II symptoms of exertional fatigue and shortness of breath consistent with chronic diastolic congestive heart failure. Her echo shows an indeterminate number of cusps and is likely bicuspid given her age. I agree that aortic valve replacement is indicated for relief of her symptoms and to prevent progressive left ventricular dysfunction. Her cardiac catheterization shows no coronary disease. Her aortic root and ascending aorta appear to be a fairly normal size on echocardiogram so I do not think a CT scan is necessary. I discussed the alternatives of bioprosthetic and mechanical valves with her including the pros and cons of both. She would like to use a bioprosthetic valve. I have reviewed the echo and cardiac catheterization images with her and answered all of her questions. I discussed the operative procedure with the patient including alternatives, benefits and risks; including but not limited to bleeding, blood transfusion, infection, stroke, myocardial infarction, graft failure, heart block requiring a permanent pacemaker, organ dysfunction, and  death. [name] understands and agrees to proceed.   Preparation:  The patient was seen in the preoperative holding area and the correct patient, correct operation were confirmed with the patient after reviewing the medical record and catheterization. The consent was signed by me. Preoperative antibiotics were given. A pulmonary arterial line and radial arterial line were placed by the anesthesia team. The patient was taken back to the operating room and positioned supine on the operating room table. After being placed under general endotracheal anesthesia by the anesthesia team a foley catheter was placed. The neck, chest, abdomen, and both legs were prepped with betadine soap and solution and draped in the usual sterile manner. A surgical time-out was taken and the correct patient and operative procedure were confirmed with the nursing and anesthesia staff.   Pre-bypass TEE:   Complete TEE assessment was performed by Dr. Corinne. This showed a thickened and restricted bicuspid aortic valve. No AI, No MR. Normal LV systolic function.    Post-bypass TEE:   Normal functioning prosthetic aortic valve with no perivalvular leak or regurgitation through the valve. Left ventricular function preserved. No mitral regurgitation.    Cardiopulmonary Bypass:  A median sternotomy was performed. The pericardium was opened in the midline. Right ventricular function appeared normal. The ascending aorta was of normal size and had no palpable plaque. There were no contraindications to aortic cannulation or cross-clamping. The patient was fully systemically heparinized and the ACT was maintained > 400 sec. The proximal aortic arch was cannulated with a 22 F aortic cannula for arterial inflow. Venous cannulation was performed via the right atrial appendage using a two-staged venous cannula. An antegrade cardioplegia/vent cannula was inserted into the mid-ascending aorta. A left ventricular vent was  placed via the right superior pulmonary vein. A retrograde  cardioplegia cannnula was placed into the coronary sinus via the right atrium. Aortic occlusion was performed with a single cross-clamp. Systemic cooling to 32 degrees Centigrade and topical cooling of the heart with iced saline were used. Hyperkalemic antegrade cold blood cardioplegia was used to induce diastolic arrest and then cold blood retrograde cardioplegia was given at about 20 minute intervals throughout the period of arrest to maintain myocardial temperature at or below 10 degrees centigrade. A temperature probe was inserted into the interventricular septum and an insulating pad was placed in the pericardium. Carbon dioxide was insufflated into the pericardium at 5L/min throughout the procedure to minimize intracardiac air.   Aortic Valve Replacement: Assisted by Kyla Donald, PA-C  A transverse aortotomy was performed 1 cm above the take-off of the right coronary artery. The native valve was bicuspid with thickened leaflets and mild annular calcification. The left and right leaflets were completely fused with a single complete raphe. The non-coronary leaflets was thickened and calcified with restricted mobility. The ostia of the coronary arteries were in normal position and were not obstructed. The native valve leaflets were excised and the annulus was decalcified with rongeurs. Care was taken to remove all particulate debris. The left ventricle was directly inspected for debris and then irrigated with ice saline solution. The annulus was sized and a size 21 mm Edwards INSPIRIS RESILIA pericardial valve was chosen. The model number was [id number] and the serial number was [id number]. While the valve was being prepared 2-0 Ethibond pledgeted horizontal mattress sutures were placed around the annulus with the pledgets in a sub-annular position. The sutures were placed through the sewing ring and the valve lowered into place. The sutures were  tied using CorKnots. The valve seated nicely and the coronary ostia were not obstructed. The prosthetic valve leaflets moved normally and there was no sub-valvular obstruction. The aortotomy was closed using 4-0 Prolene suture in 2 layers with felt strips to reinforce the closure.  Completion:  The patient was rewarmed to 37 degrees Centigrade. De-airing maneuvers were performed and the head placed in trendelenburg position. The crossclamp was removed with a time of 63 minutes. There was spontaneous return of sinus rhythm. The aortotomy was checked for hemostasis. Two temporary epicardial pacing wires were placed on the right atrium and two on the right ventricle. The left ventricular vent and retrograde cardioplegia cannulas were removed. The patient was weaned from CPB without difficulty on no inotropes. CPB time was 88 minutes. Cardiac output was 5 LPM. Heparin  was fully reversed with protamine and the aortic and venous cannulas removed. Hemostasis was achieved. Mediastinal drainage tubes were placed. The sternum was closed with  #6 stainless steel wires. The fascia was closed with continuous # 1 vicryl suture. The subcutaneous tissue was closed with 2-0 vicryl continuous suture. The skin was closed with 3-0 vicryl subcuticular suture. All sponge, needle, and instrument counts were reported correct at the end of the case. Dry sterile dressings were placed over the incisions and around the chest tubes which were connected to pleurevac suction. The patient was then transported to the surgical intensive care unit in stable condition.

## 2024-05-03 NOTE — Anesthesia Procedure Notes (Signed)
 Central Venous Catheter Insertion Performed by: Corinne Garnette BRAVO, MD, anesthesiologist Start/End12/07/2023 7:00 AM, 05/03/2024 7:10 AM Patient location: Pre-op. Preanesthetic checklist: patient identified, IV checked, site marked, risks and benefits discussed, surgical consent, monitors and equipment checked, pre-op evaluation, timeout performed and anesthesia consent Position: Trendelenburg Lidocaine  1% used for infiltration and patient sedated Hand hygiene performed , maximum sterile barriers used  and Seldinger technique used Catheter size: 9 Fr Central line was placed.MAC introducer Procedure performed using ultrasound to evaluate access site. Ultrasound Notes:relevant anatomy identified, ultrasound used to visualize needle entry, vessel patent under ultrasound and image(s) printed for medical record. Attempts: 1 Following insertion, line sutured, dressing applied and Biopatch. Post procedure assessment: free fluid flow, blood return through all ports and no air  Patient tolerated the procedure well with no immediate complications.

## 2024-05-03 NOTE — Anesthesia Procedure Notes (Signed)
 Arterial Line Insertion Start/End12/07/2023 6:55 AM, 05/03/2024 6:58 AM Performed by: Corinne Garnette BRAVO, MD, Tajon Moring Laren, CRNA, CRNA  Patient location: OOR procedure area. Preanesthetic checklist: patient identified, IV checked, site marked, risks and benefits discussed, surgical consent, monitors and equipment checked, pre-op evaluation, timeout performed and anesthesia consent Lidocaine  1% used for infiltration Left, radial was placed Catheter size: 20 G Hand hygiene performed  and maximum sterile barriers used  Allen's test indicative of satisfactory collateral circulation Attempts: 1 Following insertion, dressing applied and Biopatch. Post procedure assessment: normal and unchanged  Patient tolerated the procedure well with no immediate complications.

## 2024-05-03 NOTE — Progress Notes (Signed)
  TCTS Evening Rounds:   Hemodynamically stable  CI = 2.4  Extubated about 30 min ago  Urine output good  CT output low  CBC    Component Value Date/Time   WBC 16.5 (H) 05/03/2024 1156   RBC 4.00 05/03/2024 1156   HGB 10.5 (L) 05/03/2024 1201   HGB 14.3 03/28/2024 1609   HCT 31.0 (L) 05/03/2024 1201   HCT 42.7 03/28/2024 1609   PLT 240 05/03/2024 1156   PLT 420 03/28/2024 1609   MCV 88.3 05/03/2024 1156   MCV 87 03/28/2024 1609   MCH 29.0 05/03/2024 1156   MCHC 32.9 05/03/2024 1156   RDW 12.5 05/03/2024 1156   RDW 12.6 03/28/2024 1609     BMET    Component Value Date/Time   NA 140 05/03/2024 1201   NA 139 03/28/2024 1609   K 3.6 05/03/2024 1201   CL 101 05/03/2024 1048   CO2 25 04/27/2024 1115   GLUCOSE 159 (H) 05/03/2024 1048   BUN 11 05/03/2024 1048   BUN 18 03/28/2024 1609   CREATININE 0.60 05/03/2024 1048   CALCIUM 9.8 04/27/2024 1115   EGFR 105 03/28/2024 1609   GFRNONAA >60 04/27/2024 1115     A/P:  Stable postop course. Continue current plans

## 2024-05-03 NOTE — Anesthesia Procedure Notes (Signed)
 Central Venous Catheter Insertion Performed by: Corinne Garnette BRAVO, MD, anesthesiologist Start/End12/07/2023 7:10 AM, 05/03/2024 7:15 AM Patient location: Pre-op. Preanesthetic checklist: patient identified, IV checked, site marked, risks and benefits discussed, surgical consent, monitors and equipment checked, pre-op evaluation and timeout performed Position: Trendelenburg Hand hygiene performed  and maximum sterile barriers used  Total catheter length 100. PA cath was placed.Swan type:thermodilution PA Cath depth:46 Attempts: 1 Patient tolerated the procedure well with no immediate complications.

## 2024-05-03 NOTE — Transfer of Care (Signed)
 Immediate Anesthesia Transfer of Care Note  Patient: [name]  Procedure(s) Performed: REPLACEMENT, AORTIC VALVE, OPEN USING INSPIRIS RESILIA AORTIC VALVE (Chest) ECHOCARDIOGRAM, TRANSESOPHAGEAL, INTRAOPERATIVE  Patient Location: ICU  Anesthesia Type:General  Level of Consciousness: Patient remains intubated per anesthesia plan  Airway & Oxygen Therapy: Patient remains intubated per anesthesia plan and Patient placed on Ventilator (see vital sign flow sheet for setting)  Post-op Assessment: Report given to RN and Post -op Vital signs reviewed and stable  Post vital signs: Reviewed and stable  Last Vitals:  Vitals Value Taken Time  BP 115/83   Temp    Pulse 90 05/03/24 11:54  Resp 15 05/03/24 11:54  SpO2 99 % 05/03/24 11:54  Vitals shown include unfiled device data.  Last Pain:  Vitals:   05/03/24 0612  TempSrc: Oral  PainSc: 0-No pain         Complications: No notable events documented.

## 2024-05-03 NOTE — Discharge Summary (Signed)
 [address] [place] [address]             [id number]        Physician Discharge Summary  Patient ID: [name] MRN: [id number] DOB/AGE: [birth year] 53 y.o.  Admit date: 05/03/2024 Discharge date: 05/10/2024  Admission Diagnoses:  Patient Active Problem List   Diagnosis Date Noted   S/P aortic valve replacement with bioprosthetic valve 05/03/2024   Aortic stenosis, severe 05/03/2024   Endotracheally intubated 05/03/2024   Severe aortic valve stenosis 04/04/2024   Edema 03/22/2024   Leg swelling 03/22/2024   Spondylosis, cervical, with myelopathy 02/03/2024   Mitral valve prolapse 01/29/2024   Nausea    Murmur 08/29/2014   Other migraine without status migrainosus, not intractable 08/29/2014     Discharge Diagnoses:  Patient Active Problem List   Diagnosis Date Noted   S/P aortic valve replacement with bioprosthetic valve 05/03/2024   Aortic stenosis, severe 05/03/2024   Endotracheally intubated 05/03/2024   Severe aortic valve stenosis 04/04/2024   Edema 03/22/2024   Leg swelling 03/22/2024   Spondylosis, cervical, with myelopathy 02/03/2024   Mitral valve prolapse 01/29/2024   Nausea    Murmur 08/29/2014   Other migraine without status migrainosus, not intractable 08/29/2014     Discharged Condition: Stable  HPI: The patient is a 53 year old woman who was seen by Dr. Gollan in 2016 for chest discomfort and a low-grade murmur was heard prompting an echo which showed mild aortic valve calcification with a mean gradient of 14 mm Hg consistent with mild aortic stenosis. She now presents with shortness of breath walking up hills or stair and some swelling in her ankles which is new. She has noticed the swelling present when she wakes up in the morning and it does not seem to change over the course of the day. She denies any chest discomfort, dizziness or orthopnea. 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 A 2D echo on 03/28/24 showed an increase in the mean gradient to 39 mm Hg with a DI of 0.19. There was an indeterminate number of cusps. Ascending aortic diameter was 3.5 cm. Cardiac cath on 04/04/2024 showed normal coronary arteries. Dr. Lucas discussed the need for aortic valve replacement. He discussed potential risks, benefits, and complications of the surgery. Dr. Lucas also discussed the alternatives of bioprosthetic and mechanical valves with her including the pros and cons of both. She chose to proceed with a bioprosthetic valve. Pre operative carotid duplex US  showed no significant internal carotid artery stenosis bilaterally.  Hospital Course: Patient underwent a median sternotomy for aortic valve replacement (size 21 mm). She was transported from the OR to [facility] ICU in stable condition. She was extubated early the afternoon of surgery. Norva Purl and a line were removed on POD 1. Chest tubes were removed later on POD 1. SBP was initially low normal so Lopressor  not started yet. She was transitioned off the Insulin  drip. Her pre op HGA1C was 5.6. Accu checks and SS will be stopped in the next 24 hours. She had persistent nausea the evening of POD 1. Zofran  was not really helping so this was stopped and she was given Phenergan  and scheduled Reglan . She was then put on Meclizine  as it was thought she had vertigo. She went into a fib and was put on an Amiodarone  drip POD 3.  She converted back to sinus  rhythm within a short time but then had some recurrent A-fib.  She was converted to oral amiodarone  on postop day 4.  She was ready for transfer to [facility] on postop day 4 but no bed did not become available until the following day.  Her HGA1C was 5.6. Accu checks and SS PRN were stopped. She has been tolerating a diet and has had a bowel movement. She has been ambulating on room air with good oxygenation. Sternal wound is clean, dry, and healing without signs of infection. She  progressed appropriately with mobility.  Diuresis was continued. Lopressor  was increased to 25 mg bid for better HR control. PA/LAT CXR done POD 6 showed mildly increased bilateral pleural effusions. She is felt stable for discharge today.  Consults: None  Significant Diagnostic Studies:   Narrative & Impression  CLINICAL DATA:  Follow-up pleural effusions.   EXAM: CHEST - 2 VIEW   COMPARISON:  05/08/2024   FINDINGS: Normal-sized heart. Stable prosthetic aortic valve. Mildly increased small bilateral pleural effusions. Decreased bibasilar atelectasis. Mild thoracic spine degenerative changes.   IMPRESSION: 1. Mildly increased small bilateral pleural effusions. 2. Decreased bibasilar atelectasis.     Electronically Signed   By: Elspeth Bathe M.D.   On: 05/09/2024 14:11     Narrative & Impression  CLINICAL DATA:  Status post aortic valve replacement   EXAM: PORTABLE CHEST 1 VIEW   COMPARISON:  Chest radiograph dated 05/04/2024   FINDINGS: Lines/tubes: Interval removal of right IJ PA catheter. Sheath remains with tip projecting over the SVC.   Interval removal of mediastinal catheters. Epicardial pacing wires remain.   Lungs: Slightly low lung volumes. Decreased right basilar patchy opacity. Increased dense left retrocardiac opacity.   Pleura: Trace bilateral pleural effusions.  No pneumothorax.   Heart/mediastinum: Similar postsurgical cardiomediastinal silhouette.   Bones: Median sternotomy wires are nondisplaced.   IMPRESSION: 1. Interval removal of right IJ PA catheter and mediastinal catheters. 2. Decreased right basilar patchy opacity and increased dense left retrocardiac opacity, likely atelectasis. 3. Trace bilateral pleural effusions.     Electronically Signed   By: Limin  Xu M.D.   On: 05/05/2024 07:17      Treatments: surgery:  Median Sternotomy Extracorporeal circulation 3.   Aortic valve replacement using a 21 mm Edwards INSPIRIS RESILIA  pericardial valve by Dr. Lucas on 05/03/2024.  Discharge Exam: Blood pressure 107/60, pulse 83, temperature 98.4 F (36.9 C), temperature source Oral, resp. rate 17, height 5' 2 (1.575 m), weight 65 kg, last menstrual period 10/21/2016, SpO2 96%. Cardiovascular: RRR Pulmonary: Slightly diminished bibasilar breath sounds Abdomen: Soft, non tender, bowel sounds present. Extremities: Trace bilateral lower extremity edema. Wound: Clean and dry.  No erythema or signs of infection.   Discharge Medications:  The patient has been discharged on:   1.Beta Blocker:  Yes [ x  ]                              No   [   ]                              If No, reason:  2.Ace Inhibitor/ARB: Yes [   ]  No  [  x  ]                                     If No, reason:Labile BP. Will try to start as an outpatient  3.Statin:   Yes [   ]                  No  [  x ]                  If No, reason:No CAD  4.ArleeneBETHA Chaney creighton   ]                  No   [   ]                  If No, reason:  Patient had ACS upon admission:  Plavix/P2Y12 inhibitor: Yes [   ]                                      No  [  x ]      Allergies as of 05/10/2024   No Known Allergies      Medication List     STOP taking these medications    ciprofloxacin  500 MG tablet Commonly known as: Cipro        TAKE these medications    Acetaminophen  Extra Strength 500 MG Tabs Commonly known as: TYLENOL  Take 2 tablets (1,000 mg total) by mouth every 8 (eight) hours.   amiodarone  200 MG tablet Commonly known as: PACERONE  Take 200 mg bid for 10 days;then take 200 mg daily thereafter Start taking on: May 14, 2024   aspirin  EC 325 MG tablet Take 1 tablet (325 mg total) by mouth daily.   B COMPLEX PO Take 1 tablet by mouth daily.   cyclobenzaprine  5 MG tablet Commonly known as: FLEXERIL  Take 5 mg by mouth 3 (three) times daily as needed for muscle spasms.   D3 5000 PO Take  2,000 Units by mouth daily.   furosemide  40 MG tablet Commonly known as: LASIX  Take 1 tablet (40 mg total) by mouth daily. For 5 days then stop.   HAIR SKIN & NAILS GUMMIES PO Take 2 each by mouth daily.   ibuprofen 200 MG tablet Commonly known as: ADVIL Take 200-600 mg by mouth every 6 (six) hours as needed for mild pain (pain score 1-3).   metoprolol  tartrate 25 MG tablet Commonly known as: LOPRESSOR  Take 1 tablet (25 mg total) by mouth 2 (two) times daily.   Multiple Vitamins tablet Take 1 tablet by mouth daily.   nystatin  100000 UNIT/ML suspension Commonly known as: MYCOSTATIN  Take 5 mLs (500,000 Units total) by mouth 4 (four) times daily.   ondansetron  8 MG disintegrating tablet Commonly known as: ZOFRAN -ODT Take 8 mg by mouth every 8 (eight) hours as needed for nausea or vomiting.   potassium chloride  SA 20 MEQ tablet Commonly known as: KLOR-CON  M Take 1 tablet (20 mEq total) by mouth daily. For 5 days then stop.   rizatriptan  10 MG tablet Commonly known as: MAXALT  Take 1 tablet (10 mg total) by mouth every 2 (two) hours as needed. Max 2 tabs per day What changed: Another medication with the same name was removed. Continue taking this medication, and  follow the directions you see here.   traMADol  50 MG tablet Commonly known as: ULTRAM  Take 1 tablet (50 mg total) by mouth every 6 (six) hours as needed for moderate pain (pain score 4-6).               Durable Medical Equipment  (From admission, onward)           Start     Ordered   05/09/24 0812  For home use only DME Tub bench  Once        05/09/24 9188   05/09/24 0811  For home use only DME 3 n 1  Once        05/09/24 0811   05/09/24 0811  For home use only DME Bedside commode  Once       Question:  Patient needs a bedside commode to treat with the following condition  Answer:  Physical deconditioning   05/09/24 0811   05/09/24 0810  For home use only DME Walker rolling  Once       Question  Answer Comment  Walker: With 5 Inch Wheels   Patient needs a walker to treat with the following condition Physical deconditioning      05/09/24 0809            Follow-up Information     [facility] [employer] [facility] [employer]. Go on 06/14/2024.   Specialty: Cardiology Why: Appointment time is at 10:30 am Contact information: [address] [place]  [address] [phone]        Rutha Manuelita HERO, [place]. Go on 05/18/2024.   Specialties: Physician Assistant, Thoracic Surgery Why: Please arrive by 3:00 pm in order to have a PA/LAT CXR taken PRIOR to office appointment with L. Fenyak PA-C. Appointment time is at 4:00 pm Contact information: [address] [place] [address] [phone]         Sherial Bail, MD. Call.   Specialty: Internal Medicine Why: for a follow up 2-3 weeks after discharge Contact information: [address] [place] [address] [phone]         Franchester, Cadence H, PA-C. Go on 05/24/2024.   Specialty: Cardiology Why: Appointment time is at 2:20 pm Contact information: [address] [place] [address] [phone]                 Signed:  Kyla HERO Dwan DEVONNA 05/10/2024, 7:37 AM

## 2024-05-03 NOTE — Consult Note (Addendum)
 NAME:  [name], MRN:  [id number], DOB:  1970/09/23, LOS: 0 ADMISSION DATE:  05/03/2024, CONSULTATION DATE:  05/03/2024 REFERRING MD:  Dr. Lucas, CHIEF COMPLAINT:  s/p AVR   History of Present Illness:  [name] is a 53 year old female with history of mitral valve prolapse, mild MR, and severe aortic stenosis who presents for aortic valve replacement. She is s/p bioprosthetic AVR (21 mm Inspiris) with Dr. Lucas on 05/03/2024.  Intra-op she received: 1450cc LR, 250cc albumin, 455 cell saver, no blood products EBL: 750cc UOP: 975cc Cross clamp time: 70' CPB time: 42'  Pertinent  Medical History  Mitral valve prolapse Mild MR Aortic stenosis  Significant Hospital Events: Including procedures, antibiotic start and stop dates in addition to other pertinent events   05/03/2024 s/p AVR  Interim History / Subjective:  Arrives to ICU post-op from AVR. Arrived intubated and sedated on dex and phenylephrine .   Objective    Blood pressure 126/74, pulse 76, temperature 98.5 F (36.9 C), temperature source Oral, resp. rate 16, height 5' 2 (1.575 m), weight 64.6 kg, last menstrual period 10/21/2016, SpO2 100%. PAP: (17-323)/(9-306) 17/9      Intake/Output Summary (Last 24 hours) at 05/03/2024 1050 Last data filed at 05/03/2024 1038 Gross per 24 hour  Intake 1750 ml  Output 625 ml  Net 1125 ml   Filed Weights   05/03/24 0612  Weight: 64.6 kg    Examination: General: intubated and sedated HENT: [place]/AT, PERRL, sclera anicteric Lungs: on mechanical ventilation, clear to auscultation bilaterally Cardiovascular: regular rate and rhythm, normal S1 and S2, no m/r/g, MSI in place dressed with mediport tape c/d/I, 2A2V wires to pacing box which is turned off, 2 MCT with thin, red output Abdomen: soft, non-distended Extremities: warm, dry, trace pedal edema Neuro: intubated and sedated GU: foley in place draining clear yellow urine   Resolved problem list   Assessment  and Plan   Severe aortic stenosis s/p bAVR  Post-CPB vasoplegia  -Post-op management per TCTS -Continue phenylephrine  as needed to maintain MAP 70-90  -Fluid resuscitation with albumin as indicated by hemodynamic markers of perfusion -Monitor chest tube output -Avoid hypothermia, acidemia, coagulopathy and hypocalcemia -ASA to resume 12/3 -BB to start tonight if able to wean vasopressors  -Multimodal pain control per protocol with scheduled APAP, and PRN oxycodone , tramadol and morphine  with bowel regimen  Endotracheally intubated, post-op -ABG/CXR now -Rapid vent wean per TCTS protocol -VAP prophylaxis/PPI  Expected acute blood loss anemia Expected consumptive thrombocytopenia -Continue to monitor CBC -Transfuse for Hgb goal>8 or hemodynamically significant bleeding -Correction of coagulopathy as indicated for bleeding  At risk for hyperglycemia HbA1c 5.6. -Continue insulin drip for now, CBG goal 100-180  History of shoulder pain History of migraine -Holding home Flexeril  for now  Labs   CBC: Recent Labs  Lab 04/27/24 1115 05/03/24 0807 05/03/24 0847 05/03/24 0909 05/03/24 0914 05/03/24 0953 05/03/24 1000  WBC 7.2  --   --   --   --   --   --   HGB 13.8   < > 11.2* 8.5* 8.8* 8.8* 8.6*  HCT 41.5   < > 33.0* 25.0* 26.0* 26.0* 25.6*  MCV 88.3  --   --   --   --   --   --   PLT 366  --   --   --   --   --  261   < > = values in this interval not displayed.    Basic  Metabolic Panel: Recent Labs  Lab 04/27/24 1115 05/03/24 0807 05/03/24 0847 05/03/24 0909 05/03/24 0914 05/03/24 0953  NA 140 140 140 140 141 139  K 4.1 3.6 3.6 3.9 3.7 3.6  CL 102 104 104  --   --  103  CO2 25  --   --   --   --   --   GLUCOSE 98 104* 133*  --   --  149*  BUN 13 11 11   --   --  11  CREATININE 0.57 0.60 0.50  --   --  0.60  CALCIUM 9.8  --   --   --   --   --    GFR: Estimated Creatinine Clearance: 70.9 mL/min (by C-G formula based on SCr of 0.6 mg/dL). Recent Labs  Lab  04/27/24 1115  WBC 7.2    Liver Function Tests: Recent Labs  Lab 04/27/24 1115  AST 23  ALT 22  ALKPHOS 66  BILITOT 0.7  PROT 7.2  ALBUMIN 4.1   No results for input(s): LIPASE, AMYLASE in the last 168 hours. No results for input(s): AMMONIA in the last 168 hours.  ABG    Component Value Date/Time   PHART 7.402 05/03/2024 0909   PCO2ART 42.9 05/03/2024 0909   PO2ART 339 (H) 05/03/2024 0909   HCO3 26.9 05/03/2024 0914   TCO2 27 05/03/2024 0953   O2SAT 84 05/03/2024 0914     Coagulation Profile: Recent Labs  Lab 04/27/24 1115  INR 0.9    Cardiac Enzymes: No results for input(s): CKTOTAL, CKMB, CKMBINDEX, TROPONINI in the last 168 hours.  HbA1C: Hgb A1c MFr Bld  Date/Time Value Ref Range Status  04/27/2024 11:16 AM 5.6 4.8 - 5.6 % Final    Comment:    (NOTE)         Prediabetes: 5.7 - 6.4         Diabetes: >6.4         Glycemic control for adults with diabetes: <7.0   04/06/2016 09:52 PM 5.3 4.8 - 5.6 % Final    Comment:    (NOTE)         Pre-diabetes: 5.7 - 6.4         Diabetes: >6.4         Glycemic control for adults with diabetes: <7.0     CBG: No results for input(s): GLUCAP in the last 168 hours.  Review of Systems:   Unable to obtain, patient intubated and sedated  Past Medical History:  She,  has a past medical history of Difficult intubation, History of palpitations, Migraine, Mitral valve prolapse, Severe aortic stenosis, and Wears contact lenses.   Surgical History:   Past Surgical History:  Procedure Laterality Date   APPENDECTOMY     BICEPT TENODESIS Right 01/29/2024   Procedure: TENODESIS, BICEPS;  Surgeon: Tobie Priest, MD;  Location: [facility];  Service: Orthopedics;  Laterality: Right;  Right shoulder arthroscopic rotator cuff repair, biceps tenodesis, distal clavicle excision, subacromial decompression   CESAREAN SECTION     x2   ESOPHAGOGASTRODUODENOSCOPY (EGD) WITH PROPOFOL  N/A 04/07/2016    Procedure: ESOPHAGOGASTRODUODENOSCOPY (EGD) WITH PROPOFOL ;  Surgeon: Rogelia Copping, MD;  Location: [facility];  Service: Endoscopy;  Laterality: N/A;   RIGHT HEART CATH AND CORONARY ANGIOGRAPHY Bilateral 04/04/2024   Procedure: RIGHT HEART CATH AND CORONARY ANGIOGRAPHY;  Surgeon: Darron Deatrice LABOR, MD;  Location: [facility];  Service: Cardiovascular;  Laterality: Bilateral;   SHOULDER ARTHROSCOPY WITH OPEN ROTATOR CUFF  REPAIR Right 01/29/2024   Procedure: ARTHROSCOPY, SHOULDER WITH REPAIR, ROTATOR CUFF;  Surgeon: Tobie Priest, MD;  Location: [facility];  Service: Orthopedics;  Laterality: Right;  Right shoulder arthroscopic rotator cuff repair, biceps tenodesis, distal clavicle excision, subacromial decompression   SUBACROMIAL DECOMPRESSION Right 01/29/2024   Procedure: DECOMPRESSION, SUBACROMIAL SPACE;  Surgeon: Tobie Priest, MD;  Location: [facility];  Service: Orthopedics;  Laterality: Right;  Right shoulder arthroscopic rotator cuff repair, biceps tenodesis, distal clavicle excision, subacromial decompression   TONSILLECTOMY       Social History:   reports that she has never smoked. She has never used smokeless tobacco. She reports that she does not drink alcohol and does not use drugs.   Family History:  Her family history includes Breast cancer in her maternal aunt and paternal aunt; Heart failure in her father.   Allergies No Known Allergies   Home Medications  Prior to Admission medications   Medication Sig Start Date End Date Taking? Authorizing Provider  acetaminophen  (TYLENOL ) 500 MG tablet Take 2 tablets (1,000 mg total) by mouth every 8 (eight) hours. 01/29/24 01/28/25 Yes Tobie Priest, MD  B Complex Vitamins (B COMPLEX PO) Take 1 tablet by mouth daily.   Yes [provider]  Biotin w/ Vitamins C & E (HAIR SKIN & NAILS GUMMIES PO) Take 2 each by mouth daily.   Yes [provider]  Cholecalciferol (D3 5000 PO) Take 2,000 Units by  mouth daily.   Yes [provider]  ciprofloxacin  (CIPRO ) 500 MG tablet Take 1 tablet (500 mg total) by mouth 2 (two) times daily for 6 days. 04/27/24 05/03/24 Yes Lucas Dorise POUR, MD  cyclobenzaprine  (FLEXERIL ) 5 MG tablet Take 5 mg by mouth 3 (three) times daily as needed for muscle spasms.   Yes [provider]  ibuprofen (ADVIL) 200 MG tablet Take 200-600 mg by mouth every 6 (six) hours as needed for mild pain (pain score 1-3).   Yes [provider]  Multiple Vitamins tablet Take 1 tablet by mouth daily.    Yes [provider]  ondansetron  (ZOFRAN -ODT) 8 MG disintegrating tablet Take 8 mg by mouth every 8 (eight) hours as needed for nausea or vomiting.   Yes [provider]  rizatriptan  (MAXALT ) 10 MG tablet Take 10 mg by mouth as needed for migraine. May repeat in 2 hours if needed   Yes [provider]  rizatriptan  (MAXALT ) 10 MG tablet Take 1 tablet (10 mg total) by mouth every 2 (two) hours as needed. Max 2 tabs per day 04/25/24  Yes      Critical care time: 30 minutes    The patient is critically ill with multiple organ system failure and requires high complexity decision making for assessment and support, frequent evaluation and titration of therapies, advanced monitoring, review of radiographic studies and interpretation of complex data.   Critical Care Time devoted to patient care services, exclusive of separately billable procedures, described in this note is 30 minutes.  Rexene LOISE Blush, PA-C [facility] 05/03/24 11:05 AM  Please see [email or web address] for pager details.  From 7A-7P if no response, please call [phone] After hours, please call ELink [phone]

## 2024-05-03 NOTE — Procedures (Signed)
 Extubation Procedure Note  Patient Details:   Name: [name] DOB: [birth year] MRN: [id number]   Airway Documentation:    Vent end date: 05/03/24 Vent end time: 1545   Evaluation  O2 sats: stable throughout Complications: No apparent complications Patient did tolerate procedure well. Bilateral Breath Sounds: Clear   Yes  Pt extubated per rapid weaning protocol. Positive cuff leak noted, no stridor heard post extubation. NIF -25 VC .   Duwaine Charles 05/03/2024, 3:51 PM

## 2024-05-03 NOTE — Interval H&P Note (Signed)
 History and Physical Interval Note:  05/03/2024 6:37 AM  [name]  has presented today for surgery, with the diagnosis of AS.  The various methods of treatment have been discussed with the patient and family. After consideration of risks, benefits and other options for treatment, the patient has consented to  Procedure(s): REPLACEMENT, AORTIC VALVE, OPEN (N/A) ECHOCARDIOGRAM, TRANSESOPHAGEAL, INTRAOPERATIVE (N/A) as a surgical intervention.  The patient's history has been reviewed, patient examined, no change in status, stable for surgery.  I have reviewed the patient's chart and labs.  Questions were answered to the patient's satisfaction.     Rayla Pember K Aidenjames Heckmann

## 2024-05-03 NOTE — Interval H&P Note (Signed)
 History and Physical Interval Note:  05/03/2024 6:39 AM  [name]  has presented today for surgery, with the diagnosis of AS.  The various methods of treatment have been discussed with the patient and family. After consideration of risks, benefits and other options for treatment, the patient has consented to  Procedure(s): REPLACEMENT, AORTIC VALVE, OPEN (N/A) ECHOCARDIOGRAM, TRANSESOPHAGEAL, INTRAOPERATIVE (N/A) as a surgical intervention.  The patient's history has been reviewed, patient examined, no change in status, stable for surgery.  I have reviewed the patient's chart and labs.  Questions were answered to the patient's satisfaction.     Delmi Fulfer K Candas Deemer

## 2024-05-04 ENCOUNTER — Encounter (HOSPITAL_COMMUNITY): Payer: Self-pay | Admitting: Surgery

## 2024-05-04 ENCOUNTER — Inpatient Hospital Stay (HOSPITAL_COMMUNITY)

## 2024-05-04 ENCOUNTER — Other Ambulatory Visit: Payer: Self-pay | Admitting: Student

## 2024-05-04 DIAGNOSIS — Z953 Presence of xenogenic heart valve: Secondary | ICD-10-CM

## 2024-05-04 DIAGNOSIS — Z452 Encounter for adjustment and management of vascular access device: Secondary | ICD-10-CM | POA: Diagnosis not present

## 2024-05-04 DIAGNOSIS — J939 Pneumothorax, unspecified: Secondary | ICD-10-CM | POA: Diagnosis not present

## 2024-05-04 DIAGNOSIS — R918 Other nonspecific abnormal finding of lung field: Secondary | ICD-10-CM | POA: Diagnosis not present

## 2024-05-04 DIAGNOSIS — J90 Pleural effusion, not elsewhere classified: Secondary | ICD-10-CM | POA: Diagnosis not present

## 2024-05-04 LAB — GLUCOSE, CAPILLARY
Glucose-Capillary: 105 mg/dL — ABNORMAL HIGH (ref 70–99)
Glucose-Capillary: 108 mg/dL — ABNORMAL HIGH (ref 70–99)
Glucose-Capillary: 108 mg/dL — ABNORMAL HIGH (ref 70–99)
Glucose-Capillary: 116 mg/dL — ABNORMAL HIGH (ref 70–99)
Glucose-Capillary: 118 mg/dL — ABNORMAL HIGH (ref 70–99)
Glucose-Capillary: 127 mg/dL — ABNORMAL HIGH (ref 70–99)
Glucose-Capillary: 131 mg/dL — ABNORMAL HIGH (ref 70–99)
Glucose-Capillary: 89 mg/dL (ref 70–99)
Glucose-Capillary: 99 mg/dL (ref 70–99)

## 2024-05-04 LAB — CBC
HCT: 30.2 % — ABNORMAL LOW (ref 36.0–46.0)
HCT: 32 % — ABNORMAL LOW (ref 36.0–46.0)
Hemoglobin: 10 g/dL — ABNORMAL LOW (ref 12.0–15.0)
Hemoglobin: 10.3 g/dL — ABNORMAL LOW (ref 12.0–15.0)
MCH: 29.9 pg (ref 26.0–34.0)
MCH: 29.4 pg (ref 26.0–34.0)
MCHC: 33.1 g/dL (ref 30.0–36.0)
MCHC: 32.2 g/dL (ref 30.0–36.0)
MCV: 90.1 fL (ref 80.0–100.0)
MCV: 91.4 fL (ref 80.0–100.0)
Platelets: 206 K/uL (ref 150–400)
Platelets: 217 K/uL (ref 150–400)
RBC: 3.35 MIL/uL — ABNORMAL LOW (ref 3.87–5.11)
RBC: 3.5 MIL/uL — ABNORMAL LOW (ref 3.87–5.11)
RDW: 13.1 % (ref 11.5–15.5)
RDW: 13.2 % (ref 11.5–15.5)
WBC: 14.7 K/uL — ABNORMAL HIGH (ref 4.0–10.5)
WBC: 16.1 K/uL — ABNORMAL HIGH (ref 4.0–10.5)
nRBC: 0 % (ref 0.0–0.2)
nRBC: 0 % (ref 0.0–0.2)

## 2024-05-04 LAB — BASIC METABOLIC PANEL WITH GFR
Anion gap: 7 (ref 5–15)
Anion gap: 4 — ABNORMAL LOW (ref 5–15)
BUN: 9 mg/dL (ref 6–20)
BUN: 13 mg/dL (ref 6–20)
CO2: 24 mmol/L (ref 22–32)
CO2: 28 mmol/L (ref 22–32)
Calcium: 7.8 mg/dL — ABNORMAL LOW (ref 8.9–10.3)
Calcium: 8.2 mg/dL — ABNORMAL LOW (ref 8.9–10.3)
Chloride: 108 mmol/L (ref 98–111)
Chloride: 101 mmol/L (ref 98–111)
Creatinine, Ser: 0.62 mg/dL (ref 0.44–1.00)
Creatinine, Ser: 0.69 mg/dL (ref 0.44–1.00)
GFR, Estimated: >60 mL/min (ref >60)
GFR, Estimated: >60 mL/min (ref >60)
Glucose, Bld: 109 mg/dL — ABNORMAL HIGH (ref 70–99)
Glucose, Bld: 126 mg/dL — ABNORMAL HIGH (ref 70–99)
Potassium: 3.9 mmol/L (ref 3.5–5.1)
Potassium: 3.8 mmol/L (ref 3.5–5.1)
Sodium: 139 mmol/L (ref 135–145)
Sodium: 133 mmol/L — ABNORMAL LOW (ref 135–145)

## 2024-05-04 LAB — ECHO INTRAOPERATIVE TEE
AR max vel: 0.65 cm2
AV Area VTI: 0.5 cm2
AV Area mean vel: 0.6 cm2
AV Mean grad: 27 mmHg
AV Peak grad: 42.5 mmHg
Ao pk vel: 3.26 m/s
Height: 62 in
Weight: 2280.01 [oz_av]

## 2024-05-04 LAB — SURGICAL PATHOLOGY

## 2024-05-04 LAB — MAGNESIUM
Magnesium: 2.4 mg/dL (ref 1.7–2.4)
Magnesium: 2.1 mg/dL (ref 1.7–2.4)

## 2024-05-04 MED ORDER — INSULIN ASPART 100 UNIT/ML IJ SOLN: 0.0000 [IU] | INTRAMUSCULAR | Status: DC

## 2024-05-04 MED ORDER — ENOXAPARIN SODIUM 40 MG/0.4ML IJ SOSY
40.0000 mg | PREFILLED_SYRINGE | Freq: Every day | INTRAMUSCULAR | Status: DC
  Administered 2024-05-04 – 2024-05-09 (×6): 40 mg via SUBCUTANEOUS
  Filled 2024-05-04 (×6): qty 0.4

## 2024-05-04 MED ORDER — FENTANYL CITRATE (PF) 50 MCG/ML IJ SOSY
25.0000 ug | PREFILLED_SYRINGE | Freq: Once | INTRAMUSCULAR | Status: AC
  Administered 2024-05-04: 25 ug via INTRAVENOUS
  Filled 2024-05-04: qty 1

## 2024-05-04 MED ORDER — KETOROLAC TROMETHAMINE 15 MG/ML IJ SOLN
15.0000 mg | Freq: Four times a day (QID) | INTRAMUSCULAR | Status: AC | PRN
  Administered 2024-05-04 – 2024-05-07 (×4): 15 mg via INTRAVENOUS
  Filled 2024-05-04 (×4): qty 1

## 2024-05-04 MED FILL — Lidocaine HCl Local Preservative Free (PF) Inj 2%: INTRAMUSCULAR | Qty: 14 | Status: AC

## 2024-05-04 MED FILL — Thrombin (Recombinant) For Soln 20000 Unit: CUTANEOUS | Qty: 1 | Status: AC

## 2024-05-04 MED FILL — Sodium Chloride IV Soln 0.9%: INTRAVENOUS | Qty: 2000 | Status: AC

## 2024-05-04 MED FILL — Mannitol IV Soln 20%: INTRAVENOUS | Qty: 500 | Status: AC

## 2024-05-04 MED FILL — Heparin Sodium (Porcine) Inj 1000 Unit/ML: INTRAMUSCULAR | Qty: 10 | Status: AC

## 2024-05-04 MED FILL — Sodium Bicarbonate IV Soln 8.4%: INTRAVENOUS | Qty: 50 | Status: AC

## 2024-05-04 MED FILL — Heparin Sodium (Porcine) Inj 1000 Unit/ML: Qty: 1000 | Status: AC

## 2024-05-04 MED FILL — Potassium Chloride Inj 2 mEq/ML: INTRAVENOUS | Qty: 40 | Status: AC

## 2024-05-04 MED FILL — Electrolyte-R (PH 7.4) Solution: INTRAVENOUS | Qty: 3000 | Status: AC

## 2024-05-04 NOTE — Progress Notes (Signed)
 EVENING ROUNDS NOTE :     [address] [employer] [address]             [phone]               1 Day Post-Op Procedure(s) (LRB): REPLACEMENT, AORTIC VALVE, OPEN USING INSPIRIS RESILIA AORTIC VALVE (N/A) ECHOCARDIOGRAM, TRANSESOPHAGEAL, INTRAOPERATIVE (N/A)   Total Length of Stay:  LOS: 1 day  Events:   No events Stable day    BP 99/64   Pulse 97   Temp 99.3 F (37.4 C)   Resp 12   Ht 5' 2 (1.575 m)   Wt 68.7 kg   LMP 10/21/2016   SpO2 95%   BMI 27.70 kg/m   PAP: (13-31)/(1-17) 17/9 CVP:  [1 mmHg-34 mmHg] 6 mmHg PCWP:  [6 mmHg] 6 mmHg CO:  [4.2 L/min-5.3 L/min] 5.3 L/min CI:  [2.5 L/min/m2-3.2 L/min/m2] 3.2 L/min/m2      sodium chloride       ceFAZolin  (ANCEF ) IV 2 g (05/04/24 1448)   promethazine  (PHENERGAN ) injection (IM or IVPB) Stopped (05/03/24 1652)    I/O last 3 completed shifts: In: 5831.5 [P.O.:240; I.V.:2619.5; Blood:455; IV Piggyback:2517] Out: 3001 [Urine:2725; Chest Tube:276]      Latest Ref Rng & Units 05/04/2024    4:08 PM 05/04/2024    4:06 AM 05/03/2024    5:55 PM  CBC  WBC 4.0 - 10.5 K/uL 16.1  14.7  13.1   Hemoglobin 12.0 - 15.0 g/dL 89.6  89.9  89.4   Hematocrit 36.0 - 46.0 % 32.0  30.2  31.8   Platelets 150 - 400 K/uL 217  206  192        Latest Ref Rng & Units 05/04/2024    4:06 AM 05/03/2024    5:55 PM 05/03/2024    5:36 PM  BMP  Glucose 70 - 99 mg/dL 890  856    BUN 6 - 20 mg/dL 9  10    Creatinine 9.55 - 1.00 mg/dL 9.37  9.37    Sodium 864 - 145 mmol/L 139  139  142   Potassium 3.5 - 5.1 mmol/L 3.9  4.0  4.0   Chloride 98 - 111 mmol/L 108  109    CO2 22 - 32 mmol/L 24  24    Calcium 8.9 - 10.3 mg/dL 7.8  7.2      ABG    Component Value Date/Time   PHART 7.286 (L) 05/03/2024 1736   PCO2ART 48.8 (H) 05/03/2024 1736   PO2ART 146 (H) 05/03/2024 1736   HCO3 23.1 05/03/2024 1736   TCO2 25 05/03/2024 1736   ACIDBASEDEF 3.0 (H) 05/03/2024 1736   O2SAT 99 05/03/2024 1736       Linnie Rayas, MD 05/04/2024 4:46 PM

## 2024-05-04 NOTE — Plan of Care (Signed)

## 2024-05-04 NOTE — Progress Notes (Signed)
 Orders for echo in about 6 weeks as recently had an aortic valve replacement.

## 2024-05-04 NOTE — TOC Initial Note (Signed)
 Transition of Care [facility]) - Initial/Assessment Note    Patient Details  Name: [name] MRN: [id number] Date of Birth: 1970/08/15  [facility] CM/SW Contact:    Sudie Erminio Deems, RN Phone Number: 05/04/2024, 4:40 PM  Clinical Narrative: Patient presented with shortness of breath and swelling in her ankles. Patient POD 1 AVR. PTA patient states she is independent from home with spouse and has support of daughter. Patient has insurance and PCP-no issues with transportation to appointments. ICM will continue to follow for disposition needs as the patient progresses.              Expected Discharge Plan: Home/Self Care Barriers to Discharge: Continued Medical Work up  Expected Discharge Plan and Services In-house Referral: NA Discharge Planning Services: CM Consult Post Acute Care Choice: NA Living arrangements for the past 2 months: Single Family Home                   DME Agency: NA  Prior Living Arrangements/Services Living arrangements for the past 2 months: Single Family Home Lives with:: Spouse, Adult Children Patient language and need for interpreter reviewed:: Yes Do you feel safe going back to the place where you live?: Yes      Need for Family Participation in Patient Care: Yes (Comment) Care giver support system in place?: Yes (comment)   Criminal Activity/Legal Involvement Pertinent to Current Situation/Hospitalization: No - Comment as needed  Activities of Daily Living   ADL Screening (condition at time of admission) Independently performs ADLs?: Yes (appropriate for developmental age) Is the patient deaf or have difficulty hearing?: No Does the patient have difficulty seeing, even when wearing glasses/contacts?: No Does the patient have difficulty concentrating, remembering, or making decisions?: No  Permission Sought/Granted Permission sought to share information with : Family Supports, Case Manager   Emotional  Assessment Appearance:: Appears stated age Attitude/Demeanor/Rapport: Engaged Affect (typically observed): Appropriate Orientation: : Oriented to Self, Oriented to Place, Oriented to  Time Alcohol / Substance Use: Not Applicable Psych Involvement: No (comment)  Admission diagnosis:  Aortic valve stenosis, etiology of cardiac valve disease unspecified [I35.0] Aortic stenosis, severe [I35.0] Patient Active Problem List   Diagnosis Date Noted   S/P aortic valve replacement with bioprosthetic valve 05/03/2024   Aortic stenosis, severe 05/03/2024   Endotracheally intubated 05/03/2024   Severe aortic valve stenosis 04/04/2024   Edema 03/22/2024   Leg swelling 03/22/2024   Spondylosis, cervical, with myelopathy 02/03/2024   Mitral valve prolapse 01/29/2024   Nausea    Murmur 08/29/2014   Other migraine without status migrainosus, not intractable 08/29/2014   PCP:  Sherial Bail, MD Pharmacy:   [facility] [address] [place] [address] Phone: [phone] Fax: [phone]  Social Drivers of Health (SDOH) Social History: SDOH Screenings   Food Insecurity: No Food Insecurity (02/02/2024)   Received from [facility]  Housing: Low Risk  (02/02/2024)   Received from [facility]  Transportation Needs: No Transportation Needs (02/02/2024)   Received from [facility]  Utilities: Not At Risk (02/02/2024)   Received [facility]  Financial Resource Strain: Low Risk  (02/02/2024)   Received from [facility]  Tobacco Use: Low Risk  (05/03/2024)   Readmission Risk Interventions     No data to display

## 2024-05-04 NOTE — Consult Note (Signed)
 NAME:  [name], MRN:  [id number], DOB:  November 01, 1970, LOS: 1 ADMISSION DATE:  05/03/2024, CONSULTATION DATE:  05/03/2024 REFERRING MD:  Dr. Lucas, CHIEF COMPLAINT:  s/p AVR   History of Present Illness:  [name] is a 53 year old female with history of mitral valve prolapse, mild MR, and severe aortic stenosis who presents for aortic valve replacement. She is s/p bioprosthetic AVR (21 mm Inspiris) with Dr. Lucas on 05/03/2024.  Intra-op she received: 1450cc LR, 250cc albumin, 455 cell saver, no blood products EBL: 750cc UOP: 975cc Cross clamp time: 67' CPB time: 64'  Pertinent  Medical History  Mitral valve prolapse Mild MR Aortic stenosis  Significant Hospital Events: Including procedures, antibiotic start and stop dates in addition to other pertinent events   05/03/2024 s/p AVR, extubated 05/04/24 progressing well, off pressors, pain controlled  Interim History / Subjective:  No acute overnight events, good urine output, CVP 6, CO 4.4, CI 2.69 Not passing gas yet 276 cc chest tube output  Objective    Blood pressure 106/70, pulse 98, temperature 99.5 F (37.5 C), resp. rate 13, height 5' 2 (1.575 m), weight 68.7 kg, last menstrual period 10/21/2016, SpO2 98%. PAP: (13-31)/(1-21) 16/10 CVP:  [1 mmHg-34 mmHg] 7 mmHg PCWP:  [6 mmHg-14 mmHg] 6 mmHg CO:  [2.4 L/min-5.3 L/min] 5.3 L/min CI:  [1.5 L/min/m2-3.2 L/min/m2] 3.2 L/min/m2  Vent Mode: SIMV;PSV;PRVC FiO2 (%):  [50 %] 50 % Set Rate:  [16 bmp] 16 bmp Vt Set:  [400 mL] 400 mL PEEP:  [5 cmH20] 5 cmH20 Pressure Support:  [10 cmH20] 10 cmH20 Plateau Pressure:  [16 cmH20] 16 cmH20   Intake/Output Summary (Last 24 hours) at 05/04/2024 0907 Last data filed at 05/04/2024 0800 Gross per 24 hour  Intake 4489.16 ml  Output 2901 ml  Net 1588.16 ml   Filed Weights   05/03/24 0612 05/04/24 0500  Weight: 64.6 kg 68.7 kg   General: Well-nourished female, resting in bed in no acute distress HEENT: MM  pink/moist, sclera anicteric Neuro: Awake and alert, moving all extremities CV: s1s2 RRR, no m/r/g, sternotomy dressing clean and dry, chest tubes in place PULM: Clear bilaterally on nasal cannula GI: soft, bsx4 active  Extremities: warm/dry, no edema     Resolved problem list  Post-CPB vasoplegia  Endotracheally intubated,  Assessment and Plan   Severe aortic stenosis s/p bAVR  -Post-op management per TCTS, plan to remove Swan, art line and chest tubes today -Off phenylephrine  -Fluid resuscitation with albumin as indicated by hemodynamic markers of perfusion -Monitor chest tube output -Avoid hypothermia, acidemia, coagulopathy and hypocalcemia -ASA to resume 12/3 -BB to start tonight if able to wean vasopressors  -Multimodal pain control per protocol with scheduled APAP, and PRN oxycodone , tramadol and morphine  with bowel regimen -Encourage IS and mobilization     Expected acute blood loss anemia Expected consumptive thrombocytopenia -Continue to monitor CBC -Transfuse for Hgb goal>8 or hemodynamically significant bleeding -Correction of coagulopathy as indicated for bleeding  At risk for hyperglycemia HbA1c 5.6. - Glucose controlled on sliding scale insulin  History of shoulder pain History of migraine -Holding home Flexeril  for now  Labs   CBC: Recent Labs  Lab 04/27/24 1115 05/03/24 0807 05/03/24 1000 05/03/24 1044 05/03/24 1156 05/03/24 1201 05/03/24 1542 05/03/24 1736 05/03/24 1755 05/04/24 0406  WBC 7.2  --   --   --  16.5*  --   --   --  13.1* 14.7*  HGB 13.8   < > 8.6*   < >  11.6* 10.5* 10.2* 9.9* 10.5* 10.0*  HCT 41.5   < > 25.6*   < > 35.3* 31.0* 30.0* 29.0* 31.8* 30.2*  MCV 88.3  --   --   --  88.3  --   --   --  89.8 90.1  PLT 366  --  261  --  240  --   --   --  192 206   < > = values in this interval not displayed.    Basic Metabolic Panel: Recent Labs  Lab 04/27/24 1115 05/03/24 0807 05/03/24 0847 05/03/24 0909 05/03/24 0953  05/03/24 1044 05/03/24 1048 05/03/24 1201 05/03/24 1542 05/03/24 1736 05/03/24 1755 05/04/24 0406  NA 140   < > 140   < > 139   < > 137 140 142 142 139 139  K 4.1   < > 3.6   < > 3.6   < > 3.7 3.6 4.4 4.0 4.0 3.9  CL 102   < > 104  --  103  --  101  --   --   --  109 108  CO2 25  --   --   --   --   --   --   --   --   --  24 24  GLUCOSE 98   < > 133*  --  149*  --  159*  --   --   --  143* 109*  BUN 13   < > 11  --  11  --  11  --   --   --  10 9  CREATININE 0.57   < > 0.50  --  0.60  --  0.60  --   --   --  0.62 0.62  CALCIUM 9.8  --   --   --   --   --   --   --   --   --  7.2* 7.8*  MG  --   --   --   --   --   --   --   --   --   --  3.1* 2.4   < > = values in this interval not displayed.   GFR: Estimated Creatinine Clearance: 73 mL/min (by C-G formula based on SCr of 0.62 mg/dL). Recent Labs  Lab 04/27/24 1115 05/03/24 1156 05/03/24 1755 05/04/24 0406  WBC 7.2 16.5* 13.1* 14.7*    Liver Function Tests: Recent Labs  Lab 04/27/24 1115  AST 23  ALT 22  ALKPHOS 66  BILITOT 0.7  PROT 7.2  ALBUMIN 4.1   No results for input(s): LIPASE, AMYLASE in the last 168 hours. No results for input(s): AMMONIA in the last 168 hours.  ABG    Component Value Date/Time   PHART 7.286 (L) 05/03/2024 1736   PCO2ART 48.8 (H) 05/03/2024 1736   PO2ART 146 (H) 05/03/2024 1736   HCO3 23.1 05/03/2024 1736   TCO2 25 05/03/2024 1736   ACIDBASEDEF 3.0 (H) 05/03/2024 1736   O2SAT 99 05/03/2024 1736     Coagulation Profile: Recent Labs  Lab 04/27/24 1115 05/03/24 1156  INR 0.9 1.4*    Cardiac Enzymes: No results for input(s): CKTOTAL, CKMB, CKMBINDEX, TROPONINI in the last 168 hours.  HbA1C: Hgb A1c MFr Bld  Date/Time Value Ref Range Status  04/27/2024 11:16 AM 5.6 4.8 - 5.6 % Final    Comment:    (NOTE)         Prediabetes: 5.7 - 6.4  Diabetes: >6.4         Glycemic control for adults with diabetes: <7.0   04/06/2016 09:52 PM 5.3 4.8 - 5.6 % Final     Comment:    (NOTE)         Pre-diabetes: 5.7 - 6.4         Diabetes: >6.4         Glycemic control for adults with diabetes: <7.0     CBG: Recent Labs  Lab 05/04/24 0012 05/04/24 0204 05/04/24 0307 05/04/24 0405 05/04/24 0814  GLUCAP 118* 108* 105* 108* 116*    Review of Systems:   Unable to obtain, patient intubated and sedated  Past Medical History:  She,  has a past medical history of Difficult intubation, History of palpitations, Migraine, Mitral valve prolapse, Severe aortic stenosis, and Wears contact lenses.   Surgical History:   Past Surgical History:  Procedure Laterality Date   AORTIC VALVE REPLACEMENT N/A 05/03/2024   Procedure: REPLACEMENT, AORTIC VALVE, OPEN USING INSPIRIS RESILIA AORTIC VALVE;  Surgeon: Lucas Dorise POUR, MD;  Location: [facility];  Service: Open Heart Surgery;  Laterality: N/A;   APPENDECTOMY     BICEPT TENODESIS Right 01/29/2024   Procedure: TENODESIS, BICEPS;  Surgeon: Tobie Priest, MD;  Location: [facility];  Service: Orthopedics;  Laterality: Right;  Right shoulder arthroscopic rotator cuff repair, biceps tenodesis, distal clavicle excision, subacromial decompression   CESAREAN SECTION     x2   ESOPHAGOGASTRODUODENOSCOPY (EGD) WITH PROPOFOL  N/A 04/07/2016   Procedure: ESOPHAGOGASTRODUODENOSCOPY (EGD) WITH PROPOFOL ;  Surgeon: Rogelia Copping, MD;  Location: [facility] ENDOSCOPY;  Service: Endoscopy;  Laterality: N/A;   INTRAOPERATIVE TRANSESOPHAGEAL ECHOCARDIOGRAM N/A 05/03/2024   Procedure: ECHOCARDIOGRAM, TRANSESOPHAGEAL, INTRAOPERATIVE;  Surgeon: Lucas Dorise POUR, MD;  Location: [facility] OR;  Service: Open Heart Surgery;  Laterality: N/A;   RIGHT HEART CATH AND CORONARY ANGIOGRAPHY Bilateral 04/04/2024   Procedure: RIGHT HEART CATH AND CORONARY ANGIOGRAPHY;  Surgeon: Darron Deatrice LABOR, MD;  Location: [facility] INVASIVE CV LAB;  Service: Cardiovascular;  Laterality: Bilateral;   SHOULDER ARTHROSCOPY WITH OPEN ROTATOR CUFF REPAIR Right 01/29/2024    Procedure: ARTHROSCOPY, SHOULDER WITH REPAIR, ROTATOR CUFF;  Surgeon: Tobie Priest, MD;  Location: [facility];  Service: Orthopedics;  Laterality: Right;  Right shoulder arthroscopic rotator cuff repair, biceps tenodesis, distal clavicle excision, subacromial decompression   SUBACROMIAL DECOMPRESSION Right 01/29/2024   Procedure: DECOMPRESSION, SUBACROMIAL SPACE;  Surgeon: Tobie Priest, MD;  Location: [facility];  Service: Orthopedics;  Laterality: Right;  Right shoulder arthroscopic rotator cuff repair, biceps tenodesis, distal clavicle excision, subacromial decompression   TONSILLECTOMY       Social History:   reports that she has never smoked. She has never used smokeless tobacco. She reports that she does not drink alcohol and does not use drugs.   Family History:  Her family history includes Breast cancer in her maternal aunt and paternal aunt; Heart failure in her father.   Allergies No Known Allergies   Home Medications  Prior to Admission medications   Medication Sig Start Date End Date Taking? Authorizing Provider  acetaminophen  (TYLENOL ) 500 MG tablet Take 2 tablets (1,000 mg total) by mouth every 8 (eight) hours. 01/29/24 01/28/25 Yes Tobie Priest, MD  B Complex Vitamins (B COMPLEX PO) Take 1 tablet by mouth daily.   Yes [provider]  Biotin w/ Vitamins C & E (HAIR SKIN & NAILS GUMMIES PO) Take 2 each by mouth daily.   Yes [provider]  Cholecalciferol (D3 5000  PO) Take 2,000 Units by mouth daily.   Yes [provider]  ciprofloxacin  (CIPRO ) 500 MG tablet Take 1 tablet (500 mg total) by mouth 2 (two) times daily for 6 days. 04/27/24 05/03/24 Yes Bartle, Dorise POUR, MD  cyclobenzaprine  (FLEXERIL ) 5 MG tablet Take 5 mg by mouth 3 (three) times daily as needed for muscle spasms.   Yes [provider]  ibuprofen (ADVIL) 200 MG tablet Take 200-600 mg by mouth every 6 (six) hours as needed for mild pain (pain score 1-3).   Yes  [provider]  Multiple Vitamins tablet Take 1 tablet by mouth daily.    Yes [provider]  ondansetron  (ZOFRAN -ODT) 8 MG disintegrating tablet Take 8 mg by mouth every 8 (eight) hours as needed for nausea or vomiting.   Yes [provider]  rizatriptan  (MAXALT ) 10 MG tablet Take 10 mg by mouth as needed for migraine. May repeat in 2 hours if needed   Yes [provider]  rizatriptan  (MAXALT ) 10 MG tablet Take 1 tablet (10 mg total) by mouth every 2 (two) hours as needed. Max 2 tabs per day 04/25/24  Yes      Critical care time:      Leita SAUNDERS Batul Diego, PA-C [facility] 05/04/24 9:07 AM  Please see [email or web address] for pager details.  From 7A-7P if no response, please call [phone] After hours, please call ELink [phone]

## 2024-05-04 NOTE — Anesthesia Postprocedure Evaluation (Signed)
 Anesthesia Post Note  Patient: [name]  Procedure(s) Performed: REPLACEMENT, AORTIC VALVE, OPEN USING INSPIRIS RESILIA AORTIC VALVE (Chest) ECHOCARDIOGRAM, TRANSESOPHAGEAL, INTRAOPERATIVE     Patient location during evaluation: SICU Anesthesia Type: General Level of consciousness: sedated Pain management: pain level controlled Vital Signs Assessment: post-procedure vital signs reviewed and stable Respiratory status: patient remains intubated per anesthesia plan Cardiovascular status: stable Postop Assessment: no apparent nausea or vomiting Anesthetic complications: no   No notable events documented.  Last Vitals:    Last Pain:                 [name]

## 2024-05-04 NOTE — Plan of Care (Signed)

## 2024-05-04 NOTE — Progress Notes (Signed)
 1 Day Post-Op Procedure(s) (LRB): REPLACEMENT, AORTIC VALVE, OPEN USING INSPIRIS RESILIA AORTIC VALVE (N/A) ECHOCARDIOGRAM, TRANSESOPHAGEAL, INTRAOPERATIVE (N/A) Subjective: Complains of back pain. She takes Flexeril  occasionally for that.  Objective: Vital signs in last 24 hours: Temp:  [97.5 F (36.4 C)-100.4 F (38 C)] 99.5 F (37.5 C) (12/03 0700) Pulse Rate:  [73-112] 98 (12/03 0700) Cardiac Rhythm: Normal sinus rhythm (12/02 2000) Resp:  [0-30] 13 (12/03 0700) BP: (86-132)/(44-108) 106/70 (12/03 0700) SpO2:  [91 %-100 %] 98 % (12/03 0700) Arterial Line BP: (67-273)/(15-97) 88/54 (12/03 0700) FiO2 (%):  [50 %] 50 % (12/02 1200) Weight:  [68.7 kg] 68.7 kg (12/03 0500)  Hemodynamic parameters for last 24 hours: PAP: (13-323)/(1-306) 16/10 CVP:  [1 mmHg-34 mmHg] 7 mmHg PCWP:  [6 mmHg-14 mmHg] 6 mmHg CO:  [2.4 L/min-5.3 L/min] 5.3 L/min CI:  [1.5 L/min/m2-3.2 L/min/m2] 3.2 L/min/m2  Intake/Output from previous day: 12/02 0701 - 12/03 0700 In: 5831.5 [P.O.:240; I.V.:2619.5; Blood:455; IV Piggyback:2517] Out: 3001 [Urine:2725; Chest Tube:276] Intake/Output this shift: No intake/output data recorded.  General appearance: alert and cooperative Neurologic: intact Heart: regular rate and rhythm, S1, S2 normal, no murmur Lungs: clear to auscultation bilaterally Extremities: edema mild Wound: dressing dry  Lab Results: Recent Labs    05/03/24 1755 05/04/24 0406  WBC 13.1* 14.7*  HGB 10.5* 10.0*  HCT 31.8* 30.2*  PLT 192 206   BMET:  Recent Labs    05/03/24 1755 05/04/24 0406  NA 139 139  K 4.0 3.9  CL 109 108  CO2 24 24  GLUCOSE 143* 109*  BUN 10 9  CREATININE 0.62 0.62  CALCIUM 7.2* 7.8*    PT/INR:  Recent Labs    05/03/24 1156  LABPROT 17.8*  INR 1.4*   ABG    Component Value Date/Time   PHART 7.286 (L) 05/03/2024 1736   HCO3 23.1 05/03/2024 1736   TCO2 25 05/03/2024 1736   ACIDBASEDEF 3.0 (H) 05/03/2024 1736   O2SAT 99 05/03/2024 1736    CBG (last 3)  Recent Labs    05/04/24 0204 05/04/24 0307 05/04/24 0405  GLUCAP 108* 105* 108*   CXR: ok  ECG: pending.  Assessment/Plan: S/P Procedure(s) (LRB): REPLACEMENT, AORTIC VALVE, OPEN USING INSPIRIS RESILIA AORTIC VALVE (N/A) ECHOCARDIOGRAM, TRANSESOPHAGEAL, INTRAOPERATIVE (N/A)  POD 1 Hemodynamically stable in NSR. BP is low normal so will hold off on Lopressor today. Keep pacing wires in today.  DC swan, arterial line.  Dangle and DC chest tubes.  Glucose under good control on SSI. Preop Hgb A1c was 5.6. Will stop later today if stays under control.  IS, OOB, mobilize.   LOS: 1 day    [name] 05/04/2024

## 2024-05-05 ENCOUNTER — Inpatient Hospital Stay (HOSPITAL_COMMUNITY)

## 2024-05-05 LAB — CBC
HCT: 27.9 % — ABNORMAL LOW (ref 36.0–46.0)
Hemoglobin: 9 g/dL — ABNORMAL LOW (ref 12.0–15.0)
MCH: 29.5 pg (ref 26.0–34.0)
MCHC: 32.3 g/dL (ref 30.0–36.0)
MCV: 91.5 fL (ref 80.0–100.0)
Platelets: 165 K/uL (ref 150–400)
RBC: 3.05 MIL/uL — ABNORMAL LOW (ref 3.87–5.11)
RDW: 13.2 % (ref 11.5–15.5)
WBC: 13.1 K/uL — ABNORMAL HIGH (ref 4.0–10.5)
nRBC: 0.2 % (ref 0.0–0.2)

## 2024-05-05 LAB — GLUCOSE, CAPILLARY
Glucose-Capillary: 96 mg/dL (ref 70–99)
Glucose-Capillary: 108 mg/dL — ABNORMAL HIGH (ref 70–99)
Glucose-Capillary: 103 mg/dL — ABNORMAL HIGH (ref 70–99)
Glucose-Capillary: 125 mg/dL — ABNORMAL HIGH (ref 70–99)
Glucose-Capillary: 87 mg/dL (ref 70–99)

## 2024-05-05 LAB — BASIC METABOLIC PANEL WITH GFR
Anion gap: 8 (ref 5–15)
BUN: 14 mg/dL (ref 6–20)
CO2: 26 mmol/L (ref 22–32)
Calcium: 7.8 mg/dL — ABNORMAL LOW (ref 8.9–10.3)
Chloride: 103 mmol/L (ref 98–111)
Creatinine, Ser: 0.58 mg/dL (ref 0.44–1.00)
GFR, Estimated: >60 mL/min (ref >60)
Glucose, Bld: 109 mg/dL — ABNORMAL HIGH (ref 70–99)
Potassium: 4 mmol/L (ref 3.5–5.1)
Sodium: 137 mmol/L (ref 135–145)

## 2024-05-05 MED ORDER — MECLIZINE HCL 12.5 MG PO TABS
12.5000 mg | ORAL_TABLET | Freq: Two times a day (BID) | ORAL | Status: DC
  Administered 2024-05-05 – 2024-05-10 (×7): 12.5 mg via ORAL
  Filled 2024-05-05 (×10): qty 1

## 2024-05-05 MED ORDER — TRAMADOL HCL 50 MG PO TABS
50.0000 mg | ORAL_TABLET | ORAL | Status: DC | PRN
  Administered 2024-05-06 – 2024-05-09 (×7): 50 mg via ORAL
  Filled 2024-05-05 (×7): qty 1

## 2024-05-05 MED ORDER — METOCLOPRAMIDE HCL 5 MG/ML IJ SOLN
10.0000 mg | Freq: Four times a day (QID) | INTRAMUSCULAR | Status: AC
  Administered 2024-05-05 – 2024-05-06 (×6): 10 mg via INTRAVENOUS
  Filled 2024-05-05 (×6): qty 2

## 2024-05-05 MED ORDER — ENSURE PLUS HIGH PROTEIN PO LIQD: 237.0000 mL | Freq: Two times a day (BID) | ORAL | Status: DC

## 2024-05-05 NOTE — Progress Notes (Signed)
 NAME:  [name], MRN:  [id number], DOB:  25-Jun-1970, LOS: 2 ADMISSION DATE:  05/03/2024, CONSULTATION DATE:  05/03/2024 REFERRING MD:  Dr. Lucas, CHIEF COMPLAINT:  s/p AVR   History of Present Illness:  [name] is a 53 year old female with history of mitral valve prolapse, mild MR, and severe aortic stenosis who presents for aortic valve replacement. She is s/p bioprosthetic AVR (21 mm Inspiris) with Dr. Lucas on 05/03/2024.  Intra-op she received: 1450cc LR, 250cc albumin, 455 cell saver, no blood products EBL: 750cc UOP: 975cc Cross clamp time: 41' CPB time: 27'  Pertinent  Medical History  Mitral valve prolapse Mild MR Aortic stenosis  Significant Hospital Events: Including procedures, antibiotic start and stop dates in addition to other pertinent events   05/03/2024 s/p AVR, extubated 05/04/24 progressing well, off pressors, pain controlled  Interim History / Subjective:  Patient had the onset of nausea overnight with zofran  being ineffective Feels fatigued this morning No abdominal pain, is passing gas  Borderline low BP 688cc UOP yesterday  Objective    Blood pressure 93/61, pulse 85, temperature 98.5 F (36.9 C), temperature source Oral, resp. rate 10, height 5' 2 (1.575 m), weight 68.8 kg, last menstrual period 10/21/2016, SpO2 99%.        Intake/Output Summary (Last 24 hours) at 05/05/2024 0949 Last data filed at 05/05/2024 0700 Gross per 24 hour  Intake 246.47 ml  Output 613 ml  Net -366.53 ml   Filed Weights   05/03/24 0612 05/04/24 0500 05/05/24 0500  Weight: 64.6 kg 68.7 kg 68.8 kg   General: Well-nourished female, resting in bed in no acute distress HEENT: MM pink/moist, sclera anicteric Neuro: fatigued and sleeping, but arousable and moving all extremities CV: s1s2 RRR, no m/r/g, sternotomy dressing clean and dry, chest tubes in place PULM: Clear bilaterally on nasal cannula GI: soft, bsx4 active  Extremities: warm/dry, no edema      Resolved problem list  Post-CPB vasoplegia  Endotracheally intubated,  Assessment and Plan   Severe aortic stenosis s/p bAVR  -Post-op management per TCTS, possibly remove pacing wires and foley if nausea improves, trial phenergan  -Fluid resuscitation with albumin as indicated by hemodynamic markers of perfusion -Avoid hypothermia, acidemia, coagulopathy and hypocalcemia -continue ASA, hold initiating BB and diuresis 2/2 borderline low BP  -Multimodal pain control per protocol with scheduled APAP, and PRN oxycodone , tramadol and morphine  with bowel regimen -Encourage IS and mobilization as able     Expected acute blood loss anemia Expected consumptive thrombocytopenia -Continue to monitor CBC -Transfuse for Hgb goal>8 or hemodynamically significant bleeding -Correction of coagulopathy as indicated for bleeding  At risk for hyperglycemia HbA1c 5.6. -well controlled, SSI held   History of shoulder pain History of migraine -Holding home Flexeril  for now  Labs   CBC: Recent Labs  Lab 05/03/24 1156 05/03/24 1201 05/03/24 1736 05/03/24 1755 05/04/24 0406 05/04/24 1608 05/05/24 0415  WBC 16.5*  --   --  13.1* 14.7* 16.1* 13.1*  HGB 11.6*   < > 9.9* 10.5* 10.0* 10.3* 9.0*  HCT 35.3*   < > 29.0* 31.8* 30.2* 32.0* 27.9*  MCV 88.3  --   --  89.8 90.1 91.4 91.5  PLT 240  --   --  192 206 217 165   < > = values in this interval not displayed.    Basic Metabolic Panel: Recent Labs  Lab 05/03/24 1048 05/03/24 1201 05/03/24 1736 05/03/24 1755 05/04/24 0406 05/04/24 1608 05/05/24 0415  NA  137   < > 142 139 139 133* 137  K 3.7   < > 4.0 4.0 3.9 3.8 4.0  CL 101  --   --  109 108 101 103  CO2  --   --   --  24 24 28 26   GLUCOSE 159*  --   --  143* 109* 126* 109*  BUN 11  --   --  10 9 13 14   CREATININE 0.60  --   --  0.62 0.62 0.69 0.58  CALCIUM  --   --   --  7.2* 7.8* 8.2* 7.8*  MG  --   --   --  3.1* 2.4 2.1  --    < > = values in this interval not  displayed.   GFR: Estimated Creatinine Clearance: 73.1 mL/min (by C-G formula based on SCr of 0.58 mg/dL). Recent Labs  Lab 05/03/24 1755 05/04/24 0406 05/04/24 1608 05/05/24 0415  WBC 13.1* 14.7* 16.1* 13.1*    Liver Function Tests: No results for input(s): AST, ALT, ALKPHOS, BILITOT, PROT, ALBUMIN in the last 168 hours.  No results for input(s): LIPASE, AMYLASE in the last 168 hours. No results for input(s): AMMONIA in the last 168 hours.  ABG    Component Value Date/Time   PHART 7.286 (L) 05/03/2024 1736   PCO2ART 48.8 (H) 05/03/2024 1736   PO2ART 146 (H) 05/03/2024 1736   HCO3 23.1 05/03/2024 1736   TCO2 25 05/03/2024 1736   ACIDBASEDEF 3.0 (H) 05/03/2024 1736   O2SAT 99 05/03/2024 1736     Coagulation Profile: Recent Labs  Lab 05/03/24 1156  INR 1.4*    Cardiac Enzymes: No results for input(s): CKTOTAL, CKMB, CKMBINDEX, TROPONINI in the last 168 hours.  HbA1C: Hgb A1c MFr Bld  Date/Time Value Ref Range Status  04/27/2024 11:16 AM 5.6 4.8 - 5.6 % Final    Comment:    (NOTE)         Prediabetes: 5.7 - 6.4         Diabetes: >6.4         Glycemic control for adults with diabetes: <7.0   04/06/2016 09:52 PM 5.3 4.8 - 5.6 % Final    Comment:    (NOTE)         Pre-diabetes: 5.7 - 6.4         Diabetes: >6.4         Glycemic control for adults with diabetes: <7.0     CBG: Recent Labs  Lab 05/04/24 1613 05/04/24 2044 05/04/24 2348 05/05/24 0413 05/05/24 0811  GLUCAP 131* 89 127* 96 108*    Review of Systems:   Unable to obtain, patient intubated and sedated  Past Medical History:  She,  has a past medical history of Difficult intubation, History of palpitations, Migraine, Mitral valve prolapse, Severe aortic stenosis, and Wears contact lenses.   Surgical History:   Past Surgical History:  Procedure Laterality Date   AORTIC VALVE REPLACEMENT N/A 05/03/2024   Procedure: REPLACEMENT, AORTIC VALVE, OPEN USING  INSPIRIS RESILIA AORTIC VALVE;  Surgeon: Lucas Dorise POUR, MD;  Location: [facility] OR;  Service: Open Heart Surgery;  Laterality: N/A;   APPENDECTOMY     BICEPT TENODESIS Right 01/29/2024   Procedure: TENODESIS, BICEPS;  Surgeon: Tobie Priest, MD;  Location: [facility];  Service: Orthopedics;  Laterality: Right;  Right shoulder arthroscopic rotator cuff repair, biceps tenodesis, distal clavicle excision, subacromial decompression   CESAREAN SECTION     x2  ESOPHAGOGASTRODUODENOSCOPY (EGD) WITH PROPOFOL  N/A 04/07/2016   Procedure: ESOPHAGOGASTRODUODENOSCOPY (EGD) WITH PROPOFOL ;  Surgeon: Rogelia Copping, MD;  Location: [facility] ENDOSCOPY;  Service: Endoscopy;  Laterality: N/A;   INTRAOPERATIVE TRANSESOPHAGEAL ECHOCARDIOGRAM N/A 05/03/2024   Procedure: ECHOCARDIOGRAM, TRANSESOPHAGEAL, INTRAOPERATIVE;  Surgeon: Lucas Dorise POUR, MD;  Location: [facility];  Service: Open Heart Surgery;  Laterality: N/A;   RIGHT HEART CATH AND CORONARY ANGIOGRAPHY Bilateral 04/04/2024   Procedure: RIGHT HEART CATH AND CORONARY ANGIOGRAPHY;  Surgeon: Darron Deatrice LABOR, MD;  Location: [facility] INVASIVE CV LAB;  Service: Cardiovascular;  Laterality: Bilateral;   SHOULDER ARTHROSCOPY WITH OPEN ROTATOR CUFF REPAIR Right 01/29/2024   Procedure: ARTHROSCOPY, SHOULDER WITH REPAIR, ROTATOR CUFF;  Surgeon: Tobie Priest, MD;  Location: [facility];  Service: Orthopedics;  Laterality: Right;  Right shoulder arthroscopic rotator cuff repair, biceps tenodesis, distal clavicle excision, subacromial decompression   SUBACROMIAL DECOMPRESSION Right 01/29/2024   Procedure: DECOMPRESSION, SUBACROMIAL SPACE;  Surgeon: Tobie Priest, MD;  Location: [facility];  Service: Orthopedics;  Laterality: Right;  Right shoulder arthroscopic rotator cuff repair, biceps tenodesis, distal clavicle excision, subacromial decompression   TONSILLECTOMY       Social History:   reports that she has never smoked. She has never used smokeless tobacco. She  reports that she does not drink alcohol and does not use drugs.   Family History:  Her family history includes Breast cancer in her maternal aunt and paternal aunt; Heart failure in her father.   Allergies No Known Allergies   Home Medications  Prior to Admission medications   Medication Sig Start Date End Date Taking? Authorizing Provider  acetaminophen  (TYLENOL ) 500 MG tablet Take 2 tablets (1,000 mg total) by mouth every 8 (eight) hours. 01/29/24 01/28/25 Yes Tobie Priest, MD  B Complex Vitamins (B COMPLEX PO) Take 1 tablet by mouth daily.   Yes [provider]  Biotin w/ Vitamins C & E (HAIR SKIN & NAILS GUMMIES PO) Take 2 each by mouth daily.   Yes [provider]  Cholecalciferol (D3 5000 PO) Take 2,000 Units by mouth daily.   Yes [provider]  ciprofloxacin  (CIPRO ) 500 MG tablet Take 1 tablet (500 mg total) by mouth 2 (two) times daily for 6 days. 04/27/24 05/03/24 Yes Bartle, Dorise POUR, MD  cyclobenzaprine  (FLEXERIL ) 5 MG tablet Take 5 mg by mouth 3 (three) times daily as needed for muscle spasms.   Yes [provider]  ibuprofen (ADVIL) 200 MG tablet Take 200-600 mg by mouth every 6 (six) hours as needed for mild pain (pain score 1-3).   Yes [provider]  Multiple Vitamins tablet Take 1 tablet by mouth daily.    Yes [provider]  ondansetron  (ZOFRAN -ODT) 8 MG disintegrating tablet Take 8 mg by mouth every 8 (eight) hours as needed for nausea or vomiting.   Yes [provider]  rizatriptan  (MAXALT ) 10 MG tablet Take 10 mg by mouth as needed for migraine. May repeat in 2 hours if needed   Yes [provider]  rizatriptan  (MAXALT ) 10 MG tablet Take 1 tablet (10 mg total) by mouth every 2 (two) hours as needed. Max 2 tabs per day 04/25/24  Yes      Critical care time:      Leita SAUNDERS Andras Grunewald, PA-C [facility] 05/05/24 9:49 AM  Please see [email or web address] for pager details.  From 7A-7P if no  response, please call [phone] After hours, please call ELink [phone]

## 2024-05-05 NOTE — Progress Notes (Signed)
 TCTS PM Rounding Progress Note  Worked with PT today Still with persistent nausea that feels related to movement and with looking around  Worked with PT Very little PO intake - tried to eat saltine at dinner time but couldn't  Vitals:   05/05/24 1630 05/05/24 1700  BP:  109/73  Pulse:  92  Resp:  20  Temp: 98.6 F (37 C)   SpO2:  99%    Plan: - Symptoms sound possibly due to vertigo, will try a small dose of meclizine  Con Clunes, MD Cardiothoracic Surgery Pager: [phone]

## 2024-05-05 NOTE — Evaluation (Signed)
 Physical Therapy Evaluation Patient Details Name: [name] MRN: [id number] DOB: [birth year] Today's Date: 05/05/2024  History of Present Illness  Pt is a 53 y.o female admitted 12/02 for scheduled AVR and TEE. PMH: aortic stenosis, mitral valve prolapse  Clinical Impression  Pt is currently presenting at supervision for bed mobility and sit to stand. CGA was provided during gait with use of RW due to fatigue, headache, nausea; RN was notified. Pt spouse present half way through session and supportive. Due to pt current functional status, home set up and available assistance at home no recommended skilled physical therapy services at this time on discharge from acute care hospital setting. Will continue to follow in acute setting in order to ensure that pt returns home with decreased risk for falls, injury, re-hospitalization and improved activity tolerance.          If plan is discharge home, recommend the following: Assist for transportation;Help with stairs or ramp for entrance;Assistance with cooking/housework     Equipment Recommendations Rolling walker (2 wheels)     Functional Status Assessment Patient has had a recent decline in their functional status and demonstrates the ability to make significant improvements in function in a reasonable and predictable amount of time.     Precautions / Restrictions Precautions Precautions: Sternal;Fall Precaution Booklet Issued: No Recall of Precautions/Restrictions: Intact Restrictions Weight Bearing Restrictions Per Provider Order: No RUE Weight Bearing Per Provider Order: Non weight bearing LUE Weight Bearing Per Provider Order: Non weight bearing Other Position/Activity Restrictions: Cardiac sternal precautions      Mobility  Bed Mobility Overal bed mobility: Needs Assistance Bed Mobility: Supine to Sit     Supine to sit: Supervision     General bed mobility comments: supervision for safety and adherence to  precautions    Transfers Overall transfer level: Needs assistance Equipment used: Rolling walker (2 wheels) Transfers: Sit to/from Stand Sit to Stand: Supervision           General transfer comment: supervision for safety. pt with good techniques maintaining precautions    Ambulation/Gait Ambulation/Gait assistance: Contact guard assist Gait Distance (Feet): 150 Feet Assistive device: Rolling walker (2 wheels) Gait Pattern/deviations: Step-through pattern, Decreased stride length, Trunk flexed Gait velocity: decreased Gait velocity interpretation: <1.31 ft/sec, indicative of household ambulator   General Gait Details: slight flexion of trunk, CGA for safety in hall due to nausea, fatigue and headache; limited gait distance.     Balance Overall balance assessment: Mild deficits observed, not formally tested         Pertinent Vitals/Pain Pain Assessment Pain Assessment: No/denies pain    Home Living Family/patient expects to be discharged to:: Private residence Living Arrangements: Spouse/significant other;Children Available Help at Discharge: Family;Available 24 hours/day Type of Home: House Home Access: Stairs to enter Entrance Stairs-Rails: Left Entrance Stairs-Number of Steps: 3   Home Layout: One level Home Equipment: None      Prior Function Prior Level of Function : Independent/Modified Independent   Mobility Comments: Ind, no AD ADLs Comments: Ind, works as a Engineer, Manufacturing Upper Extremity Assessment: Defer to OT evaluation    Lower Extremity Assessment Lower Extremity Assessment: Overall WFL for tasks assessed    Cervical / Trunk Assessment Cervical / Trunk Assessment: Normal  Communication   Communication Communication: No apparent difficulties    Cognition Arousal: Alert Behavior During Therapy: WFL for tasks assessed/performed     Following commands: Intact  Cueing Cueing  Techniques: Verbal cues     General Comments General comments (skin integrity, edema, etc.): Pt on 1L O2 performed gait on 2L O2 at 97% or greater. HR WNL. BP 98/63 supine, 115/72 sitting and 121/74 in standing. HR 101 bpm        Assessment/Plan    PT Assessment Patient needs continued PT services  PT Problem List Decreased activity tolerance;Decreased balance;Decreased mobility       PT Treatment Interventions DME instruction;Balance training;Gait training;Stair training;Functional mobility training;Patient/family education;Therapeutic activities;Therapeutic exercise    PT Goals (Current goals can be found in the Care Plan section)  Acute Rehab PT Goals Patient Stated Goal: to get back to work and PLOF PT Goal Formulation: With patient Time For Goal Achievement: 05/19/24 Potential to Achieve Goals: Good    Frequency Min 2X/week     Co-evaluation   Reason for Co-Treatment: Complexity of the patient's impairments (multi-system involvement);To address functional/ADL transfers   OT goals addressed during session: ADL's and self-care       AM-PAC PT 6 Clicks Mobility  Outcome Measure Help needed turning from your back to your side while in a flat bed without using bedrails?: A Little Help needed moving from lying on your back to sitting on the side of a flat bed without using bedrails?: A Little Help needed moving to and from a bed to a chair (including a wheelchair)?: A Little Help needed standing up from a chair using your arms (e.g., wheelchair or bedside chair)?: A Little Help needed to walk in hospital room?: A Little Help needed climbing 3-5 steps with a railing? : A Little 6 Click Score: 18    End of Session Equipment Utilized During Treatment: Gait belt;Oxygen Activity Tolerance: Patient tolerated treatment well Patient left: in chair;with call bell/phone within reach;with family/visitor present Nurse Communication: Mobility status;Other (comment) (headache, up  in chair) PT Visit Diagnosis: Unsteadiness on feet (R26.81);Other abnormalities of gait and mobility (R26.89)    Time: 8946-8885 PT Time Calculation (min) (ACUTE ONLY): 21 min   Charges:   PT Evaluation $PT Eval Low Complexity: 1 Low   PT General Charges $$ ACUTE PT VISIT: 1 Visit       Dorothyann Maier, DPT, CLT  Acute Rehabilitation Services Office: [phone] (Secure chat preferred)   Dorothyann VEAR Maier 05/05/2024, 2:18 PM

## 2024-05-05 NOTE — Progress Notes (Signed)
 2 Days Post-Op Procedure(s) (LRB): REPLACEMENT, AORTIC VALVE, OPEN USING INSPIRIS RESILIA AORTIC VALVE (N/A) ECHOCARDIOGRAM, TRANSESOPHAGEAL, INTRAOPERATIVE (N/A) Subjective: Reports persistent nausea overnight. Zofran  did not seem to help. Gagging this am. Has never had problems with nausea in the past.  Objective: Vital signs in last 24 hours: Temp:  [98.6 F (37 C)-99.3 F (37.4 C)] 98.7 F (37.1 C) (12/03 2349) Pulse Rate:  [85-110] 90 (12/04 0600) Cardiac Rhythm: Normal sinus rhythm (12/04 0400) Resp:  [7-28] 11 (12/04 0600) BP: (82-111)/(55-72) 94/62 (12/04 0600) SpO2:  [90 %-100 %] 97 % (12/04 0600) Arterial Line BP: (80-119)/(47-63) 119/63 (12/03 1030) Weight:  [68.8 kg] 68.8 kg (12/04 0500)  Hemodynamic parameters for last 24 hours: PAP: (17-25)/(9-13) 17/9 CVP:  [6 mmHg-11 mmHg] 6 mmHg  Intake/Output from previous day: 12/03 0701 - 12/04 0700 In: 274.7 [I.V.:126; IV Piggyback:148.7] Out: 688 [Urine:688] Intake/Output this shift: No intake/output data recorded.  General appearance: alert, cooperative, and looks uncomfortable Neurologic: intact Heart: regular rate and rhythm Lungs: clear to auscultation bilaterally Abdomen: soft, non-tender; bowel sounds present Extremities: edema mild Wound: dressing dry  Lab Results: Recent Labs    05/04/24 1608 05/05/24 0415  WBC 16.1* 13.1*  HGB 10.3* 9.0*  HCT 32.0* 27.9*  PLT 217 165   BMET:  Recent Labs    05/04/24 1608 05/05/24 0415  NA 133* 137  K 3.8 4.0  CL 101 103  CO2 28 26  GLUCOSE 126* 109*  BUN 13 14  CREATININE 0.69 0.58  CALCIUM 8.2* 7.8*    PT/INR:  Recent Labs    05/03/24 1156  LABPROT 17.8*  INR 1.4*   ABG    Component Value Date/Time   PHART 7.286 (L) 05/03/2024 1736   HCO3 23.1 05/03/2024 1736   TCO2 25 05/03/2024 1736   ACIDBASEDEF 3.0 (H) 05/03/2024 1736   O2SAT 99 05/03/2024 1736   CBG (last 3)  Recent Labs    05/04/24 2044 05/04/24 2348 05/05/24 0413  GLUCAP  89 127* 96   CXR: mild interstitial edema, left base atelectasis  Assessment/Plan: S/P Procedure(s) (LRB): REPLACEMENT, AORTIC VALVE, OPEN USING INSPIRIS RESILIA AORTIC VALVE (N/A) ECHOCARDIOGRAM, TRANSESOPHAGEAL, INTRAOPERATIVE (N/A)  POD 2 Hemodynamically stable with low normal BP. Hold off on beta blocker.  Nausea: Zofran  not working so will stop that and try phenergan . Continue Reglan.   Wt is still about 9 lbs over preop. Will hold off on diuresis with nausea, poor po intake and low normal BP.  CBG's are ok and no hx of DM. DC SSI.   May be able to remove pacing wires and foley later today if nausea resolves.  IS, OOB, ambulate as tolerated if nausea improves.   LOS: 2 days    [name] 05/05/2024

## 2024-05-05 NOTE — Evaluation (Signed)
 Occupational Therapy Evaluation Patient Details Name: [name] MRN: [id number] DOB: Jul 27, 1970 Today's Date: 05/05/2024   History of Present Illness   Pt is a 53 y.o female admitted 12/02 for scheduled AVR and TEE. PMH: aortic stenosis, mitral valve prolapse     Clinical Impressions Pt admitted based on above, and was seen based on problem list below. PTA pt was independent with ADLs and IADLs. Today pt is requiring set up  to min assist for ADLs. Bed mobility and functional transfers are  s for safety with RW. Pt currently limited by decreased activity tolerance and nausea. Anticipate pt will progress well, recommending to follow up with cardiac rehab. Recommending shower chair at d/c to promote energy conservation strategies. OT will continue to follow acutely to increase activity tolerance and educate on compensatory strategies for ADLs.     If plan is discharge home, recommend the following:   A little help with walking and/or transfers;A little help with bathing/dressing/bathroom;Assistance with cooking/housework;Supervision due to cognitive status     Functional Status Assessment   Patient has had a recent decline in their functional status and demonstrates the ability to make significant improvements in function in a reasonable and predictable amount of time.     Equipment Recommendations   BSC/3in1;Tub/shower seat      Precautions/Restrictions   Precautions Precautions: Sternal;Fall Precaution Booklet Issued: No Recall of Precautions/Restrictions: Intact Restrictions Weight Bearing Restrictions Per Provider Order: No Other Position/Activity Restrictions: Cardiac sternal precautions     Mobility Bed Mobility Overal bed mobility: Needs Assistance Bed Mobility: Supine to Sit     Supine to sit: Supervision     General bed mobility comments: S for safety, good adherance to precautions    Transfers Overall transfer level: Needs  assistance Equipment used: Rolling walker (2 wheels) Transfers: Sit to/from Stand Sit to Stand: Supervision           General transfer comment: S for safety, good use of pillow to rise. CGA for mobility in  hallway d/t fatigue and dizziness      Balance Overall balance assessment: Mild deficits observed, not formally tested         ADL either performed or assessed with clinical judgement   ADL Overall ADL's : Needs assistance/impaired Eating/Feeding: Set up;Sitting   Grooming: Set up;Sitting     Upper Body Dressing : Set up;Sitting   Lower Body Dressing: Minimal assistance;Sit to/from stand   Toilet Transfer: Supervision/safety;Ambulation;Rolling walker (2 wheels)     Functional mobility during ADLs: Supervision/safety;Rolling walker (2 wheels) General ADL Comments: Pt limited by decreased activity tolerance and nausea     Vision Baseline Vision/History: 0 No visual deficits Vision Assessment?: No apparent visual deficits            Pertinent Vitals/Pain Pain Assessment Pain Assessment: No/denies pain     Extremity/Trunk Assessment Upper Extremity Assessment Upper Extremity Assessment: Overall WFL for tasks assessed   Lower Extremity Assessment Lower Extremity Assessment: Defer to PT evaluation   Cervical / Trunk Assessment Cervical / Trunk Assessment: Normal   Communication Communication Communication: No apparent difficulties   Cognition Arousal: Alert Behavior During Therapy: WFL for tasks assessed/performed Cognition: No apparent impairments       Following commands: Intact       Cueing  General Comments   Cueing Techniques: Verbal cues  Pt received on 1L, completed mobility on 2L. O2 97% or greater. Returned to 1L at rest           Home  Living Family/patient expects to be discharged to:: Private residence Living Arrangements: Spouse/significant other;Children Available Help at Discharge: Family;Available 24 hours/day Type of  Home: House Home Access: Stairs to enter [employer] of Steps: 1   Home Layout: One level     Bathroom Shower/Tub: Producer, Television/film/video: Standard     Home Equipment: None          Prior Functioning/Environment Prior Level of Function : Independent/Modified Independent   Mobility Comments: Ind, no AD ADLs Comments: Ind, works as a SOIL SCIENTIST Problem List: Cardiopulmonary status limiting activity;Decreased activity tolerance   OT Treatment/Interventions: Self-care/ADL training;Therapeutic exercise;Energy conservation;DME and/or AE instruction;Therapeutic activities;Patient/family education;Balance training      OT Goals(Current goals can be found in the care plan section)   Acute Rehab OT Goals Patient Stated Goal: To not be nauseous OT Goal Formulation: With patient Time For Goal Achievement: 05/19/24 Potential to Achieve Goals: Good   OT Frequency:  Min 2X/week    Co-evaluation PT/OT/SLP Co-Evaluation/Treatment: Yes Reason for Co-Treatment: Complexity of the patient's impairments (multi-system involvement);To address functional/ADL transfers   OT goals addressed during session: ADL's and self-care      AM-PAC OT 6 Clicks Daily Activity     Outcome Measure Help from another person eating meals?: None Help from another person taking care of personal grooming?: A Little Help from another person toileting, which includes using toliet, bedpan, or urinal?: A Little Help from another person bathing (including washing, rinsing, drying)?: A Little Help from another person to put on and taking off regular upper body clothing?: A Little Help from another person to put on and taking off regular lower body clothing?: A Little 6 Click Score: 19   End of Session Equipment Utilized During Treatment: Gait belt;Rolling walker (2 wheels);Oxygen Nurse Communication: Mobility status  Activity Tolerance: Patient tolerated treatment well Patient left: in  chair;with call bell/phone within reach;with family/visitor present  OT Visit Diagnosis: Other (comment) (Cardiopulmonary)                Time: 8884-8860 OT Time Calculation (min): 24 min Charges:  OT General Charges $OT Visit: 1 Visit OT Evaluation $OT Eval Moderate Complexity: 1 Mod  Tayshun Gappa C, OT  Acute Rehabilitation Services Office [phone] Secure chat preferred   Adrianne GORMAN Savers 05/05/2024, 12:46 PM

## 2024-05-05 NOTE — Plan of Care (Signed)
  Problem: Education: Goal: Knowledge of General Education information will improve Description: Including pain rating scale, medication(s)/side effects and non-pharmacologic comfort measures Outcome: Progressing   Problem: Health Behavior/Discharge Planning: Goal: Ability to manage health-related needs will improve Outcome: Progressing   Problem: Clinical Measurements: Goal: Ability to maintain clinical measurements within normal limits will improve Outcome: Progressing Goal: Diagnostic test results will improve Outcome: Progressing Goal: Respiratory complications will improve Outcome: Progressing Goal: Cardiovascular complication will be avoided Outcome: Progressing   Problem: Activity: Goal: Risk for activity intolerance will decrease Outcome: Progressing   Problem: Coping: Goal: Level of anxiety will decrease Outcome: Progressing   Problem: Elimination: Goal: Will not experience complications related to bowel motility Outcome: Progressing Goal: Will not experience complications related to urinary retention Outcome: Progressing   Problem: Pain Managment: Goal: General experience of comfort will improve and/or be controlled Outcome: Progressing   Problem: Safety: Goal: Ability to remain free from injury will improve Outcome: Progressing   Problem: Skin Integrity: Goal: Risk for impaired skin integrity will decrease Outcome: Progressing   Problem: Education: Goal: Will demonstrate proper wound care and an understanding of methods to prevent future damage Outcome: Progressing Goal: Knowledge of disease or condition will improve Outcome: Progressing Goal: Knowledge of the prescribed therapeutic regimen will improve Outcome: Progressing Goal: Individualized Educational Video(s) Outcome: Progressing   Problem: Activity: Goal: Risk for activity intolerance will decrease Outcome: Progressing   Problem: Cardiac: Goal: Will achieve and/or maintain hemodynamic  stability Outcome: Progressing   Problem: Clinical Measurements: Goal: Postoperative complications will be avoided or minimized Outcome: Progressing   Problem: Respiratory: Goal: Respiratory status will improve Outcome: Progressing   Problem: Skin Integrity: Goal: Wound healing without signs and symptoms of infection Outcome: Progressing Goal: Risk for impaired skin integrity will decrease Outcome: Progressing   Problem: Urinary Elimination: Goal: Ability to achieve and maintain adequate renal perfusion and functioning will improve Outcome: Progressing   Problem: Clinical Measurements: Goal: Will remain free from infection Outcome: Not Progressing   Problem: Nutrition: Goal: Adequate nutrition will be maintained Outcome: Not Progressing

## 2024-05-06 LAB — GLUCOSE, CAPILLARY
Glucose-Capillary: 116 mg/dL — ABNORMAL HIGH (ref 70–99)
Glucose-Capillary: 117 mg/dL — ABNORMAL HIGH (ref 70–99)
Glucose-Capillary: 125 mg/dL — ABNORMAL HIGH (ref 70–99)
Glucose-Capillary: 125 mg/dL — ABNORMAL HIGH (ref 70–99)
Glucose-Capillary: 131 mg/dL — ABNORMAL HIGH (ref 70–99)
Glucose-Capillary: 228 mg/dL — ABNORMAL HIGH (ref 70–99)
Glucose-Capillary: 88 mg/dL (ref 70–99)

## 2024-05-06 LAB — CBC WITH DIFFERENTIAL/PLATELET
Abs Immature Granulocytes: 0.05 K/uL (ref 0.00–0.07)
Basophils Absolute: 0 K/uL (ref 0.0–0.1)
Basophils Relative: 0 %
Eosinophils Absolute: 0 K/uL (ref 0.0–0.5)
Eosinophils Relative: 0 %
HCT: 26.3 % — ABNORMAL LOW (ref 36.0–46.0)
Hemoglobin: 8.7 g/dL — ABNORMAL LOW (ref 12.0–15.0)
Immature Granulocytes: 1 %
Lymphocytes Relative: 18 %
Lymphs Abs: 1.8 K/uL (ref 0.7–4.0)
MCH: 29.7 pg (ref 26.0–34.0)
MCHC: 33.1 g/dL (ref 30.0–36.0)
MCV: 89.8 fL (ref 80.0–100.0)
Monocytes Absolute: 0.8 K/uL (ref 0.1–1.0)
Monocytes Relative: 8 %
Neutro Abs: 7.4 K/uL (ref 1.7–7.7)
Neutrophils Relative %: 73 %
Platelets: 171 K/uL (ref 150–400)
RBC: 2.93 MIL/uL — ABNORMAL LOW (ref 3.87–5.11)
RDW: 12.8 % (ref 11.5–15.5)
WBC: 10.1 K/uL (ref 4.0–10.5)
nRBC: 0 % (ref 0.0–0.2)

## 2024-05-06 LAB — MAGNESIUM: Magnesium: 1.8 mg/dL (ref 1.7–2.4)

## 2024-05-06 LAB — BASIC METABOLIC PANEL WITH GFR
Anion gap: 8 (ref 5–15)
BUN: 11 mg/dL (ref 6–20)
CO2: 27 mmol/L (ref 22–32)
Calcium: 7.6 mg/dL — ABNORMAL LOW (ref 8.9–10.3)
Chloride: 103 mmol/L (ref 98–111)
Creatinine, Ser: 0.75 mg/dL (ref 0.44–1.00)
GFR, Estimated: >60 mL/min (ref >60)
Glucose, Bld: 113 mg/dL — ABNORMAL HIGH (ref 70–99)
Potassium: 3.7 mmol/L (ref 3.5–5.1)
Sodium: 138 mmol/L (ref 135–145)

## 2024-05-06 MED ORDER — AMIODARONE LOAD VIA INFUSION
150.0000 mg | Freq: Once | INTRAVENOUS | Status: AC
  Administered 2024-05-06: 150 mg via INTRAVENOUS
  Filled 2024-05-06: qty 83.34

## 2024-05-06 MED ORDER — POTASSIUM CHLORIDE 10 MEQ/50ML IV SOLN
10.0000 meq | INTRAVENOUS | Status: AC
  Administered 2024-05-06 (×3): 10 meq via INTRAVENOUS
  Filled 2024-05-06 (×3): qty 50

## 2024-05-06 MED ORDER — AMIODARONE HCL IN DEXTROSE 360-4.14 MG/200ML-% IV SOLN
60.0000 mg/h | INTRAVENOUS | Status: AC
  Administered 2024-05-06 (×2): 60 mg/h via INTRAVENOUS
  Filled 2024-05-06 (×2): qty 200

## 2024-05-06 MED ORDER — AMIODARONE HCL IN DEXTROSE 360-4.14 MG/200ML-% IV SOLN
30.0000 mg/h | INTRAVENOUS | Status: DC
  Administered 2024-05-06 – 2024-05-07 (×2): 30 mg/h via INTRAVENOUS
  Filled 2024-05-06 (×2): qty 200

## 2024-05-06 MED ORDER — MAGNESIUM SULFATE 2 GM/50ML IV SOLN
2.0000 g | Freq: Once | INTRAVENOUS | Status: AC
  Administered 2024-05-06: 2 g via INTRAVENOUS
  Filled 2024-05-06: qty 50

## 2024-05-06 NOTE — Progress Notes (Signed)
 NAME:  [name], MRN:  [id number], DOB:  26-Jun-1970, LOS: 3 ADMISSION DATE:  05/03/2024, CONSULTATION DATE:  05/03/2024 REFERRING MD:  Dr. Lucas, CHIEF COMPLAINT:  s/p AVR   History of Present Illness:  [name] is a 53 year old female with history of mitral valve prolapse, mild MR, and severe aortic stenosis who presents for aortic valve replacement. She is s/p bioprosthetic AVR (21 mm Inspiris) with Dr. Lucas on 05/03/2024.  Intra-op she received: 1450cc LR, 250cc albumin , 455 cell saver, no blood products EBL: 750cc UOP: 975cc Cross clamp time: 93' CPB time: 71'  Pertinent  Medical History  Mitral valve prolapse Mild MR Aortic stenosis  Significant Hospital Events: Including procedures, antibiotic start and stop dates in addition to other pertinent events   05/03/2024 s/p AVR, extubated 05/04/24 progressing well, off pressors, pain controlled 12/4 continue to feel nauseous and fatigued, afebrile, no bowel movement but passing gas  Interim History / Subjective:  Patient went into A-fib with RVR overnight, was started on amiodarone  with conversion to sinus rhythm, currently on amiodarone  infusion Still feels nauseous but he stated much better than yesterday, walked in the hallway today Stated not much appetite, still no bowel movement  Objective    Blood pressure 120/70, pulse 86, temperature 98.5 F (36.9 C), temperature source Axillary, resp. rate 17, height 5' 2 (1.575 m), weight 68.7 kg, last menstrual period 10/21/2016, SpO2 94%.        Intake/Output Summary (Last 24 hours) at 05/06/2024 0853 Last data filed at 05/06/2024 0800 Gross per 24 hour  Intake 335.55 ml  Output 1495 ml  Net -1159.45 ml   Filed Weights   05/04/24 0500 05/05/24 0500 05/06/24 0500  Weight: 68.7 kg 68.8 kg 68.7 kg   Physical exam: General: Middle-aged female, lying on the bed HEENT: [place]/AT, eyes anicteric.  moist mucus membranes.  Introducer Central line is in place on  right IJ Neuro: Alert, awake following commands Chest: Central sternotomy incision looks clean and dry, coarse breath sounds, no wheezes or rhonchi Heart: Regular rate and rhythm, no murmurs or gallops Abdomen: Soft, nontender, nondistended, bowel sounds present  Labs and images reviewed  Patient Lines/Drains/Airways Status     Active Line/Drains/Airways     Name Placement date Placement time Site Days   Peripheral IV 05/03/24 18 G Right Wrist 05/03/24  0640  Wrist  3   Urethral Catheter Theotis Bathe, RN Straight-tip 14 Fr. 05/03/24  0759  Straight-tip  3   Wound 05/03/24 1058 Surgical Closed Surgical Incision Chest Other (Comment) 05/03/24  1058  Chest  3            Resolved problem list  Post-CPB vasoplegia  Endotracheally intubated,  Assessment and Plan  Severe aortic stenosis s/p bioprosthetic AVR  Continue aspirin  Continue multimodal pain management with ketorolac , tramadol , oxycodone  Continue aggressive bowel regimen Encourage ambulation and incentive spirometry  Paroxysmal A-fib with RVR Overnight patient went into A-fib with RVR Started on amiodarone  infusion with that patient converted to sinus rhythm Goal is to continue amiodarone  for today and switch to p.o. tomorrow  Expected acute blood loss anemia, not clinically significant Monitor H&H No signs of active bleeding at this time  Labs   CBC: Recent Labs  Lab 05/03/24 1755 05/04/24 0406 05/04/24 1608 05/05/24 0415 05/06/24 0334  WBC 13.1* 14.7* 16.1* 13.1* 10.1  NEUTROABS  --   --   --   --  7.4  HGB 10.5* 10.0* 10.3* 9.0* 8.7*  HCT  31.8* 30.2* 32.0* 27.9* 26.3*  MCV 89.8 90.1 91.4 91.5 89.8  PLT 192 206 217 165 171    Basic Metabolic Panel: Recent Labs  Lab 05/03/24 1755 05/04/24 0406 05/04/24 1608 05/05/24 0415 05/06/24 0334  NA 139 139 133* 137 138  K 4.0 3.9 3.8 4.0 3.7  CL 109 108 101 103 103  CO2 24 24 28 26 27   GLUCOSE 143* 109* 126* 109* 113*  BUN 10 9 13 14 11   CREATININE  0.62 0.62 0.69 0.58 0.75  CALCIUM 7.2* 7.8* 8.2* 7.8* 7.6*  MG 3.1* 2.4 2.1  --  1.8   GFR: Estimated Creatinine Clearance: 73 mL/min (by C-G formula based on SCr of 0.75 mg/dL). Recent Labs  Lab 05/04/24 0406 05/04/24 1608 05/05/24 0415 05/06/24 0334  WBC 14.7* 16.1* 13.1* 10.1    Liver Function Tests: No results for input(s): AST, ALT, ALKPHOS, BILITOT, PROT, ALBUMIN  in the last 168 hours.  No results for input(s): LIPASE, AMYLASE in the last 168 hours. No results for input(s): AMMONIA in the last 168 hours.  ABG    Component Value Date/Time   PHART 7.286 (L) 05/03/2024 1736   PCO2ART 48.8 (H) 05/03/2024 1736   PO2ART 146 (H) 05/03/2024 1736   HCO3 23.1 05/03/2024 1736   TCO2 25 05/03/2024 1736   ACIDBASEDEF 3.0 (H) 05/03/2024 1736   O2SAT 99 05/03/2024 1736     Coagulation Profile: Recent Labs  Lab 05/03/24 1156  INR 1.4*    Cardiac Enzymes: No results for input(s): CKTOTAL, CKMB, CKMBINDEX, TROPONINI in the last 168 hours.  HbA1C: Hgb A1c MFr Bld  Date/Time Value Ref Range Status  04/27/2024 11:16 AM 5.6 4.8 - 5.6 % Final    Comment:    (NOTE)         Prediabetes: 5.7 - 6.4         Diabetes: >6.4         Glycemic control for adults with diabetes: <7.0   04/06/2016 09:52 PM 5.3 4.8 - 5.6 % Final    Comment:    (NOTE)         Pre-diabetes: 5.7 - 6.4         Diabetes: >6.4         Glycemic control for adults with diabetes: <7.0     CBG: Recent Labs  Lab 05/05/24 1627 05/05/24 1954 05/06/24 0009 05/06/24 0339 05/06/24 0732  GLUCAP 125* 87 88 116* 125*      Valinda Novas, MD [facility] See Amion for pager If no response to pager, please call [phone] until 7pm After 7pm, Please call E-link [phone]

## 2024-05-06 NOTE — Plan of Care (Signed)
  Problem: Education: Goal: Knowledge of General Education information will improve Description: Including pain rating scale, medication(s)/side effects and non-pharmacologic comfort measures Outcome: Progressing   Problem: Health Behavior/Discharge Planning: Goal: Ability to manage health-related needs will improve Outcome: Progressing   Problem: Clinical Measurements: Goal: Ability to maintain clinical measurements within normal limits will improve Outcome: Progressing Goal: Will remain free from infection Outcome: Progressing Goal: Diagnostic test results will improve Outcome: Progressing Goal: Respiratory complications will improve Outcome: Progressing Goal: Cardiovascular complication will be avoided Outcome: Progressing   Problem: Activity: Goal: Risk for activity intolerance will decrease Outcome: Progressing   Problem: Coping: Goal: Level of anxiety will decrease Outcome: Progressing   Problem: Elimination: Goal: Will not experience complications related to bowel motility Outcome: Progressing Goal: Will not experience complications related to urinary retention Outcome: Progressing   Problem: Pain Managment: Goal: General experience of comfort will improve and/or be controlled Outcome: Progressing   Problem: Safety: Goal: Ability to remain free from injury will improve Outcome: Progressing   Problem: Skin Integrity: Goal: Risk for impaired skin integrity will decrease Outcome: Progressing   Problem: Education: Goal: Will demonstrate proper wound care and an understanding of methods to prevent future damage Outcome: Progressing Goal: Knowledge of disease or condition will improve Outcome: Progressing Goal: Knowledge of the prescribed therapeutic regimen will improve Outcome: Progressing Goal: Individualized Educational Video(s) Outcome: Progressing   Problem: Activity: Goal: Risk for activity intolerance will decrease Outcome: Progressing   Problem:  Cardiac: Goal: Will achieve and/or maintain hemodynamic stability Outcome: Progressing   Problem: Respiratory: Goal: Respiratory status will improve Outcome: Progressing   Problem: Skin Integrity: Goal: Wound healing without signs and symptoms of infection Outcome: Progressing Goal: Risk for impaired skin integrity will decrease Outcome: Progressing   Problem: Urinary Elimination: Goal: Ability to achieve and maintain adequate renal perfusion and functioning will improve Outcome: Progressing   Problem: Nutrition: Goal: Adequate nutrition will be maintained Outcome: Not Progressing   Problem: Clinical Measurements: Goal: Postoperative complications will be avoided or minimized Outcome: Not Progressing

## 2024-05-06 NOTE — Progress Notes (Signed)
 3 Days Post-Op Procedure(s) (LRB): REPLACEMENT, AORTIC VALVE, OPEN USING INSPIRIS RESILIA AORTIC VALVE (N/A) ECHOCARDIOGRAM, TRANSESOPHAGEAL, INTRAOPERATIVE (N/A) Subjective:  Feels better today. Ambulated this am and had slight nausea. Able to take some po's.  Went into atrial fib with RVR overnight and started on IV amio with prompt return to NSR but had some brief recurrence.  No SOB or chest pain.  Objective: Vital signs in last 24 hours: Temp:  [98.2 F (36.8 C)-98.6 F (37 C)] 98.5 F (36.9 C) (12/05 0700) Pulse Rate:  [85-101] 94 (12/05 0700) Cardiac Rhythm: Normal sinus rhythm (12/04 2000) Resp:  [10-23] 23 (12/05 0700) BP: (92-124)/(59-105) 113/83 (12/05 0700) SpO2:  [89 %-99 %] 89 % (12/05 0700) Weight:  [68.7 kg] 68.7 kg (12/05 0500)  Hemodynamic parameters for last 24 hours:    Intake/Output from previous day: 12/04 0701 - 12/05 0700 In: 335.6 [I.V.:158.4; IV Piggyback:177.2] Out: 1230 [Urine:1230] Intake/Output this shift: No intake/output data recorded.  General appearance: alert and cooperative Neurologic: intact Heart: regular rate and rhythm Lungs: clear to auscultation bilaterally Extremities: no edema Wound: dressing dry  Lab Results: Recent Labs    05/05/24 0415 05/06/24 0334  WBC 13.1* 10.1  HGB 9.0* 8.7*  HCT 27.9* 26.3*  PLT 165 171   BMET:  Recent Labs    05/05/24 0415 05/06/24 0334  NA 137 138  K 4.0 3.7  CL 103 103  CO2 26 27  GLUCOSE 109* 113*  BUN 14 11  CREATININE 0.58 0.75  CALCIUM 7.8* 7.6*    PT/INR:  Recent Labs    05/03/24 1156  LABPROT 17.8*  INR 1.4*   ABG    Component Value Date/Time   PHART 7.286 (L) 05/03/2024 1736   HCO3 23.1 05/03/2024 1736   TCO2 25 05/03/2024 1736   ACIDBASEDEF 3.0 (H) 05/03/2024 1736   O2SAT 99 05/03/2024 1736   CBG (last 3)  Recent Labs    05/06/24 0009 05/06/24 0339 05/06/24 0732  GLUCAP 88 116* 125*    Assessment/Plan: S/P Procedure(s) (LRB): REPLACEMENT,  AORTIC VALVE, OPEN USING INSPIRIS RESILIA AORTIC VALVE (N/A) ECHOCARDIOGRAM, TRANSESOPHAGEAL, INTRAOPERATIVE (N/A)  POD 3  Hemodynamically stable in sinus rhythm on IV amio. Lopressor  not started postop due to low normal BP. Will continue IV amio via sleeve today and plan to switch to po tomorrow and remove sleeve.  -900 cc yesterday. Wt still about 9 lbs over preop. Holding off on diuretic since not taking much po and she is spontaneously diuresing. K+ 3.7 this am and replacing IV.  DC foley.  Continue IS, ambulation.   LOS: 3 days    Dorise MARLA Fellers 05/06/2024

## 2024-05-06 NOTE — Progress Notes (Signed)
 Physical Therapy Treatment Patient Details Name: [name] MRN: [id number] DOB: 1970/10/15 Today's Date: 05/06/2024   History of Present Illness Pt is a 53 y.o female admitted 12/02 for scheduled AVR and TEE. PMH: aortic stenosis, mitral valve prolapse    PT Comments  Pt is progressing well towards goals. Currently pt is supervision for bed mobility, sit to stand and gait with RW. Pt vital signs stable throughout. Pt has good family support at home. Due to pt current functional status, home set up and available assistance at home no recommended skilled physical therapy services at this time on discharge from acute care hospital setting. Will continue to follow in acute setting in order to ensure that pt returns home with decreased risk for falls, injury, re-hospitalization and improved activity tolerance.      If plan is discharge home, recommend the following: Assist for transportation;Help with stairs or ramp for entrance;Assistance with cooking/housework     Equipment Recommendations  Rolling walker (2 wheels)       Precautions / Restrictions Precautions Precautions: Sternal;Fall Precaution Booklet Issued: No Recall of Precautions/Restrictions: Intact Restrictions Weight Bearing Restrictions Per Provider Order: No RUE Weight Bearing Per Provider Order: Non weight bearing LUE Weight Bearing Per Provider Order: Non weight bearing Other Position/Activity Restrictions: Cardiac sternal precautions     Mobility  Bed Mobility Overal bed mobility: Needs Assistance Bed Mobility: Sit to Supine       Sit to supine: Supervision   General bed mobility comments: supervision for safety and adherence to precautions    Transfers Overall transfer level: Needs assistance Equipment used: Rolling walker (2 wheels), None Transfers: Sit to/from Stand, Bed to chair/wheelchair/BSC Sit to Stand: Supervision   Step pivot transfers: Supervision       General transfer comment:  supervision for sit to stand from recliner and toilet with and without a RW. Pt requires occaisonal cues for sternal precautions.    Ambulation/Gait Ambulation/Gait assistance: Supervision Gait Distance (Feet): 750 Feet Assistive device: Rolling walker (2 wheels) Gait Pattern/deviations: Step-through pattern, Decreased stride length, Trunk flexed Gait velocity: mildly decreased Gait velocity interpretation: 1.31 - 2.62 ft/sec, indicative of limited community ambulator   General Gait Details: slight flexion of trunk; improves with cues for upright posture     Balance Overall balance assessment: Mild deficits observed, not formally tested      Communication Communication Communication: No apparent difficulties  Cognition Arousal: Alert Behavior During Therapy: WFL for tasks assessed/performed   PT - Cognitive impairments: No apparent impairments     Following commands: Intact      Cueing Cueing Techniques: Verbal cues     General Comments General comments (skin integrity, edema, etc.): Vital signs stable on room air throughout session      Pertinent Vitals/Pain Pain Assessment Pain Assessment: No/denies pain     PT Goals (current goals can now be found in the care plan section) Acute Rehab PT Goals Patient Stated Goal: to get back to work and PLOF PT Goal Formulation: With patient Time For Goal Achievement: 05/19/24 Potential to Achieve Goals: Good Progress towards PT goals: Progressing toward goals    Frequency    Min 2X/week      PT Plan  Continue with current POC        AM-PAC PT 6 Clicks Mobility   Outcome Measure  Help needed turning from your back to your side while in a flat bed without using bedrails?: A Little Help needed moving from lying on your back to sitting  on the side of a flat bed without using bedrails?: A Little Help needed moving to and from a bed to a chair (including a wheelchair)?: A Little Help needed standing up from a chair  using your arms (e.g., wheelchair or bedside chair)?: A Little Help needed to walk in hospital room?: A Little Help needed climbing 3-5 steps with a railing? : A Little 6 Click Score: 18    End of Session Equipment Utilized During Treatment: Gait belt Activity Tolerance: Patient tolerated treatment well Patient left: in bed;with call bell/phone within reach Nurse Communication: Mobility status PT Visit Diagnosis: Unsteadiness on feet (R26.81);Other abnormalities of gait and mobility (R26.89)     Time: 8581-8560 PT Time Calculation (min) (ACUTE ONLY): 21 min  Charges:    $Therapeutic Activity: 8-22 mins PT General Charges $$ ACUTE PT VISIT: 1 Visit                    Dorothyann Maier, DPT, CLT  Acute Rehabilitation Services Office: [phone] (Secure chat preferred)    Dorothyann VEAR Maier 05/06/2024, 4:31 PM

## 2024-05-07 LAB — CBC
HCT: 28 % — ABNORMAL LOW (ref 36.0–46.0)
Hemoglobin: 9 g/dL — ABNORMAL LOW (ref 12.0–15.0)
MCH: 29.2 pg (ref 26.0–34.0)
MCHC: 32.1 g/dL (ref 30.0–36.0)
MCV: 90.9 fL (ref 80.0–100.0)
Platelets: 249 K/uL (ref 150–400)
RBC: 3.08 MIL/uL — ABNORMAL LOW (ref 3.87–5.11)
RDW: 13.1 % (ref 11.5–15.5)
WBC: 9.5 K/uL (ref 4.0–10.5)
nRBC: 0 % (ref 0.0–0.2)

## 2024-05-07 LAB — GLUCOSE, CAPILLARY
Glucose-Capillary: 88 mg/dL (ref 70–99)
Glucose-Capillary: 121 mg/dL — ABNORMAL HIGH (ref 70–99)
Glucose-Capillary: 96 mg/dL (ref 70–99)
Glucose-Capillary: 103 mg/dL — ABNORMAL HIGH (ref 70–99)

## 2024-05-07 LAB — BASIC METABOLIC PANEL WITH GFR
Anion gap: 6 (ref 5–15)
BUN: 9 mg/dL (ref 6–20)
CO2: 29 mmol/L (ref 22–32)
Calcium: 8.1 mg/dL — ABNORMAL LOW (ref 8.9–10.3)
Chloride: 103 mmol/L (ref 98–111)
Creatinine, Ser: 0.57 mg/dL (ref 0.44–1.00)
GFR, Estimated: >60 mL/min (ref >60)
Glucose, Bld: 102 mg/dL — ABNORMAL HIGH (ref 70–99)
Potassium: 3.7 mmol/L (ref 3.5–5.1)
Sodium: 138 mmol/L (ref 135–145)

## 2024-05-07 MED ORDER — AMIODARONE HCL 200 MG PO TABS: 200.0000 mg | ORAL_TABLET | Freq: Every day | ORAL | Status: DC

## 2024-05-07 MED ORDER — METOPROLOL TARTRATE 12.5 MG HALF TABLET
12.5000 mg | ORAL_TABLET | Freq: Two times a day (BID) | ORAL | Status: DC
  Administered 2024-05-07 – 2024-05-08 (×4): 12.5 mg via ORAL
  Filled 2024-05-07 (×4): qty 1

## 2024-05-07 MED ORDER — SODIUM CHLORIDE 0.9% FLUSH
3.0000 mL | Freq: Two times a day (BID) | INTRAVENOUS | Status: DC
  Administered 2024-05-07 – 2024-05-10 (×7): 3 mL via INTRAVENOUS

## 2024-05-07 MED ORDER — SODIUM CHLORIDE 0.9 % IV SOLN: 250.0000 mL | INTRAVENOUS | Status: AC | PRN

## 2024-05-07 MED ORDER — SENNOSIDES-DOCUSATE SODIUM 8.6-50 MG PO TABS: 2.0000 | ORAL_TABLET | Freq: Every day | ORAL | Status: DC

## 2024-05-07 MED ORDER — FENTANYL CITRATE (PF) 50 MCG/ML IJ SOSY: 25.0000 ug | PREFILLED_SYRINGE | Freq: Once | INTRAMUSCULAR | Status: DC

## 2024-05-07 MED ORDER — INSULIN ASPART 100 UNIT/ML IJ SOLN
0.0000 [IU] | Freq: Three times a day (TID) | INTRAMUSCULAR | Status: DC
  Administered 2024-05-07: 2 [IU] via SUBCUTANEOUS
  Filled 2024-05-07: qty 2

## 2024-05-07 MED ORDER — POTASSIUM CHLORIDE 10 MEQ/50ML IV SOLN
10.0000 meq | INTRAVENOUS | Status: AC
  Administered 2024-05-07 (×3): 10 meq via INTRAVENOUS
  Filled 2024-05-07 (×3): qty 50

## 2024-05-07 MED ORDER — ~~LOC~~ CARDIAC SURGERY, PATIENT & FAMILY EDUCATION: Freq: Once | Status: AC

## 2024-05-07 MED ORDER — SODIUM CHLORIDE 0.9% FLUSH: 3.0000 mL | INTRAVENOUS | Status: DC | PRN

## 2024-05-07 MED ORDER — POLYETHYLENE GLYCOL 3350 17 G PO PACK
17.0000 g | PACK | Freq: Two times a day (BID) | ORAL | Status: DC
  Administered 2024-05-07 – 2024-05-08 (×2): 17 g via ORAL
  Filled 2024-05-07 (×3): qty 1

## 2024-05-07 MED ORDER — AMIODARONE HCL 200 MG PO TABS
400.0000 mg | ORAL_TABLET | Freq: Two times a day (BID) | ORAL | Status: DC
  Administered 2024-05-07 – 2024-05-10 (×7): 400 mg via ORAL
  Filled 2024-05-07 (×8): qty 2

## 2024-05-07 NOTE — Progress Notes (Signed)
      [address] [employer] [address]             [phone]                4 Days Post-Op Procedure(s) (LRB): REPLACEMENT, AORTIC VALVE, OPEN USING INSPIRIS RESILIA AORTIC VALVE (N/A) ECHOCARDIOGRAM, TRANSESOPHAGEAL, INTRAOPERATIVE (N/A)   Events: No events _______________________________________________________________ Vitals: BP 112/64 (BP Location: Left Arm)   Pulse 85   Temp 98 F (36.7 C) (Oral)   Resp 20   Ht 5' 2 (1.575 m)   Wt 67.8 kg   LMP 10/21/2016   SpO2 96%   BMI 27.34 kg/m  Filed Weights   05/05/24 0500 05/06/24 0500 05/07/24 0500  Weight: 68.8 kg 68.7 kg 67.8 kg     - Neuro: alert NAD  - Cardiovascular: sinus  Drips: amio 30.      - Pulm: EWOB    ABG    Component Value Date/Time   PHART 7.286 (L) 05/03/2024 1736   PCO2ART 48.8 (H) 05/03/2024 1736   PO2ART 146 (H) 05/03/2024 1736   HCO3 23.1 05/03/2024 1736   TCO2 25 05/03/2024 1736   ACIDBASEDEF 3.0 (H) 05/03/2024 1736   O2SAT 99 05/03/2024 1736    - Abd: ND - Extremity: trace edema  .Intake/Output      12/05 0701 12/06 0700 12/06 0701 12/07 0700   I.V. (mL/kg) 472.1 (7) 19.6 (0.3)   IV Piggyback 137 5   Total Intake(mL/kg) 609.1 (9) 24.6 (0.4)   Urine (mL/kg/hr) 2165 (1.3)    Total Output 2165    Net -1555.9 +24.6           _______________________________________________________________ Labs:    Latest Ref Rng & Units 05/07/2024    5:41 AM 05/06/2024    3:34 AM 05/05/2024    4:15 AM  CBC  WBC 4.0 - 10.5 K/uL 9.5  10.1  13.1   Hemoglobin 12.0 - 15.0 g/dL 9.0  8.7  9.0   Hematocrit 36.0 - 46.0 % 28.0  26.3  27.9   Platelets 150 - 400 K/uL 249  171  165       Latest Ref Rng & Units 05/07/2024    5:41 AM 05/06/2024    3:34 AM 05/05/2024    4:15 AM  CMP  Glucose 70 - 99 mg/dL 897  886  890   BUN 6 - 20 mg/dL 9  11  14    Creatinine 0.44 - 1.00 mg/dL 9.42  9.24  9.41   Sodium 135 - 145 mmol/L 138  138  137   Potassium 3.5 - 5.1 mmol/L 3.7   3.7  4.0   Chloride 98 - 111 mmol/L 103  103  103   CO2 22 - 32 mmol/L 29  27  26    Calcium 8.9 - 10.3 mg/dL 8.1  7.6  7.8     CXR: -  _______________________________________________________________  Assessment and Plan: POD 4 s/p AVR  Neuro: pain controlled CV: starting low dose BB.  Converting to PO amio Pulm: IS, ambulation Renal: creat stable GI: on diet Heme: stable ID: afebrile Endo: SSI Dispo: floor   [name] Zaara Sprowl 05/07/2024 9:47 AM

## 2024-05-07 NOTE — Progress Notes (Signed)
 NAME:  CASI WESTERFELD, MRN:  [id number], DOB:  [birth year], LOS: 4 ADMISSION DATE:  05/03/2024, CONSULTATION DATE:  05/03/2024 REFERRING MD:  Dr. Lucas, CHIEF COMPLAINT:  s/p AVR   History of Present Illness:  Ms. Guerin is a 53 year old female with history of mitral valve prolapse, mild MR, and severe aortic stenosis who presents for aortic valve replacement. She is s/p bioprosthetic AVR (21 mm Inspiris) with Dr. Lucas on 05/03/2024.  Intra-op she received: 1450cc LR, 250cc albumin , 455 cell saver, no blood products EBL: 750cc UOP: 975cc Cross clamp time: 56' CPB time: 42'  Pertinent  Medical History  Mitral valve prolapse Mild MR Aortic stenosis  Significant Hospital Events: Including procedures, antibiotic start and stop dates in addition to other pertinent events   05/03/2024 s/p AVR, extubated 05/04/24 progressing well, off pressors, pain controlled 12/4 continue to feel nauseous and fatigued, afebrile, no bowel movement but passing gas 12/5 patient went into A-fib with RVR overnight, started on amiodarone  infusion, converted back to sinus rhythm.  Still feels nauseous but stated is getting better.  No bowel movement  Interim History / Subjective:  Patient remained in sinus rhythm Stated felt little palpitation this morning but telemetry suggest sinus rhythm Remained afebrile  Objective    Blood pressure 120/72, pulse 83, temperature 98 F (36.7 C), temperature source Oral, resp. rate 16, height 5' 2 (1.575 m), weight 67.8 kg, last menstrual period 10/21/2016, SpO2 95%.        Intake/Output Summary (Last 24 hours) at 05/07/2024 0810 Last data filed at 05/07/2024 0700 Gross per 24 hour  Intake 609.11 ml  Output 1900 ml  Net -1290.89 ml   Filed Weights   05/05/24 0500 05/06/24 0500 05/07/24 0500  Weight: 68.8 kg 68.7 kg 67.8 kg    Physical exam: General: Middle-age female, lying on the bed HEENT: [place]/AT, eyes anicteric.  moist mucus membranes.  Right IJ  introducer in place Neuro: Alert, awake following commands, antigravity in all 4 extremities Chest: Central sternotomy incision looks clean and dry, coarse breath sounds, no wheezes or rhonchi Heart: Regular rate and rhythm, no murmurs or gallops Abdomen: Soft, nontender, nondistended, bowel sounds present  Labs reviewed  Patient Lines/Drains/Airways Status     Active Line/Drains/Airways     Name Placement date Placement time Site Days   Peripheral IV 05/03/24 18 G Right Wrist 05/03/24  0640  Wrist  4   Wound 05/03/24 1058 Surgical Closed Surgical Incision Chest Other (Comment) 05/03/24  1058  Chest  4            Resolved problem list  Post-CPB vasoplegia  Endotracheally intubated,  Assessment and Plan  Severe aortic stenosis s/p bioprosthetic AVR  Continue aspirin  Stated pain is well-controlled, barely using pain medication No bowel movement yet Continue docusate, started on MiraLAX  Encourage ambulation and incentive spirometry  Paroxysmal A-fib with RVR Remain in sinus rhythm Still on amiodarone  infusion, will start on metoprolol  Plan to stop amiodarone  after metoprolol   Expected acute blood loss anemia, not clinically significant H&H is stable around 9 No signs of active bleeding at this time  Labs   CBC: Recent Labs  Lab 05/04/24 0406 05/04/24 1608 05/05/24 0415 05/06/24 0334 05/07/24 0541  WBC 14.7* 16.1* 13.1* 10.1 9.5  NEUTROABS  --   --   --  7.4  --   HGB 10.0* 10.3* 9.0* 8.7* 9.0*  HCT 30.2* 32.0* 27.9* 26.3* 28.0*  MCV 90.1 91.4 91.5 89.8 90.9  PLT 206 217  165 171 249    Basic Metabolic Panel: Recent Labs  Lab 05/03/24 1755 05/04/24 0406 05/04/24 1608 05/05/24 0415 05/06/24 0334 05/07/24 0541  NA 139 139 133* 137 138 138  K 4.0 3.9 3.8 4.0 3.7 3.7  CL 109 108 101 103 103 103  CO2 24 24 28 26 27 29   GLUCOSE 143* 109* 126* 109* 113* 102*  BUN 10 9 13 14 11 9   CREATININE 0.62 0.62 0.69 0.58 0.75 0.57  CALCIUM 7.2* 7.8* 8.2* 7.8* 7.6*  8.1*  MG 3.1* 2.4 2.1  --  1.8  --    GFR: Estimated Creatinine Clearance: 72.6 mL/min (by C-G formula based on SCr of 0.57 mg/dL). Recent Labs  Lab 05/04/24 1608 05/05/24 0415 05/06/24 0334 05/07/24 0541  WBC 16.1* 13.1* 10.1 9.5    Liver Function Tests: No results for input(s): AST, ALT, ALKPHOS, BILITOT, PROT, ALBUMIN  in the last 168 hours.  No results for input(s): LIPASE, AMYLASE in the last 168 hours. No results for input(s): AMMONIA in the last 168 hours.  ABG    Component Value Date/Time   PHART 7.286 (L) 05/03/2024 1736   PCO2ART 48.8 (H) 05/03/2024 1736   PO2ART 146 (H) 05/03/2024 1736   HCO3 23.1 05/03/2024 1736   TCO2 25 05/03/2024 1736   ACIDBASEDEF 3.0 (H) 05/03/2024 1736   O2SAT 99 05/03/2024 1736     Coagulation Profile: Recent Labs  Lab 05/03/24 1156  INR 1.4*    Cardiac Enzymes: No results for input(s): CKTOTAL, CKMB, CKMBINDEX, TROPONINI in the last 168 hours.  HbA1C: Hgb A1c MFr Bld  Date/Time Value Ref Range Status  04/27/2024 11:16 AM 5.6 4.8 - 5.6 % Final    Comment:    (NOTE)         Prediabetes: 5.7 - 6.4         Diabetes: >6.4         Glycemic control for adults with diabetes: <7.0   04/06/2016 09:52 PM 5.3 4.8 - 5.6 % Final    Comment:    (NOTE)         Pre-diabetes: 5.7 - 6.4         Diabetes: >6.4         Glycemic control for adults with diabetes: <7.0     CBG: Recent Labs  Lab 05/06/24 1119 05/06/24 1556 05/06/24 1559 05/06/24 2246 05/07/24 0627  GLUCAP 125* 228* 131* 117* 88      Valinda Novas, MD [facility] See Amion for pager If no response to pager, please call [phone] until 7pm After 7pm, Please call E-link [phone]

## 2024-05-07 NOTE — Progress Notes (Signed)
 EVENING ROUNDS NOTE :     [address] [employer] [address]             [phone]               4 Days Post-Op Procedure(s) (LRB): REPLACEMENT, AORTIC VALVE, OPEN USING INSPIRIS RESILIA AORTIC VALVE (N/A) ECHOCARDIOGRAM, TRANSESOPHAGEAL, INTRAOPERATIVE (N/A)   Total Length of Stay:  LOS: 4 days  Events:   Awaiting floor bed    BP (!) 123/55   Pulse 97   Temp 99 F (37.2 C) (Oral)   Resp (!) 24   Ht 5' 2 (1.575 m)   Wt 67.8 kg   LMP 10/21/2016   SpO2 99%   BMI 27.34 kg/m          sodium chloride      promethazine  (PHENERGAN ) injection (IM or IVPB) Stopped (05/05/24 1853)    I/O last 3 completed shifts: In: 844.7 [I.V.:630.5; IV Piggyback:214.2] Out: 3180 [Urine:3180]      Latest Ref Rng & Units 05/07/2024    5:41 AM 05/06/2024    3:34 AM 05/05/2024    4:15 AM  CBC  WBC 4.0 - 10.5 K/uL 9.5  10.1  13.1   Hemoglobin 12.0 - 15.0 g/dL 9.0  8.7  9.0   Hematocrit 36.0 - 46.0 % 28.0  26.3  27.9   Platelets 150 - 400 K/uL 249  171  165        Latest Ref Rng & Units 05/07/2024    5:41 AM 05/06/2024    3:34 AM 05/05/2024    4:15 AM  BMP  Glucose 70 - 99 mg/dL 897  886  890   BUN 6 - 20 mg/dL 9  11  14    Creatinine 0.44 - 1.00 mg/dL 9.42  9.24  9.41   Sodium 135 - 145 mmol/L 138  138  137   Potassium 3.5 - 5.1 mmol/L 3.7  3.7  4.0   Chloride 98 - 111 mmol/L 103  103  103   CO2 22 - 32 mmol/L 29  27  26    Calcium 8.9 - 10.3 mg/dL 8.1  7.6  7.8     ABG    Component Value Date/Time   PHART 7.286 (L) 05/03/2024 1736   PCO2ART 48.8 (H) 05/03/2024 1736   PO2ART 146 (H) 05/03/2024 1736   HCO3 23.1 05/03/2024 1736   TCO2 25 05/03/2024 1736   ACIDBASEDEF 3.0 (H) 05/03/2024 1736   O2SAT 99 05/03/2024 1736       Linnie Rayas, MD 05/07/2024 6:23 PM

## 2024-05-08 ENCOUNTER — Inpatient Hospital Stay (HOSPITAL_COMMUNITY)

## 2024-05-08 DIAGNOSIS — J939 Pneumothorax, unspecified: Secondary | ICD-10-CM | POA: Diagnosis not present

## 2024-05-08 DIAGNOSIS — R918 Other nonspecific abnormal finding of lung field: Secondary | ICD-10-CM | POA: Diagnosis not present

## 2024-05-08 LAB — BASIC METABOLIC PANEL WITH GFR
Anion gap: 9 (ref 5–15)
BUN: 9 mg/dL (ref 6–20)
CO2: 24 mmol/L (ref 22–32)
Calcium: 8.4 mg/dL — ABNORMAL LOW (ref 8.9–10.3)
Chloride: 107 mmol/L (ref 98–111)
Creatinine, Ser: 0.58 mg/dL (ref 0.44–1.00)
GFR, Estimated: >60 mL/min (ref >60)
Glucose, Bld: 97 mg/dL (ref 70–99)
Potassium: 4 mmol/L (ref 3.5–5.1)
Sodium: 140 mmol/L (ref 135–145)

## 2024-05-08 LAB — CBC
HCT: 28 % — ABNORMAL LOW (ref 36.0–46.0)
Hemoglobin: 9.2 g/dL — ABNORMAL LOW (ref 12.0–15.0)
MCH: 29.7 pg (ref 26.0–34.0)
MCHC: 32.9 g/dL (ref 30.0–36.0)
MCV: 90.3 fL (ref 80.0–100.0)
Platelets: 299 K/uL (ref 150–400)
RBC: 3.1 MIL/uL — ABNORMAL LOW (ref 3.87–5.11)
RDW: 13.2 % (ref 11.5–15.5)
WBC: 10.3 K/uL (ref 4.0–10.5)
nRBC: 0.2 % (ref 0.0–0.2)

## 2024-05-08 LAB — GLUCOSE, CAPILLARY
Glucose-Capillary: 96 mg/dL (ref 70–99)
Glucose-Capillary: 81 mg/dL (ref 70–99)
Glucose-Capillary: 98 mg/dL (ref 70–99)

## 2024-05-08 LAB — MAGNESIUM: Magnesium: 1.8 mg/dL (ref 1.7–2.4)

## 2024-05-08 MED ORDER — FUROSEMIDE 10 MG/ML IJ SOLN: 40.0000 mg | Freq: Once | INTRAMUSCULAR | Status: DC

## 2024-05-08 MED ORDER — FUROSEMIDE 10 MG/ML IJ SOLN
40.0000 mg | Freq: Two times a day (BID) | INTRAMUSCULAR | Status: AC
  Administered 2024-05-08 (×2): 40 mg via INTRAVENOUS
  Filled 2024-05-08 (×2): qty 4

## 2024-05-08 MED ORDER — POTASSIUM CHLORIDE CRYS ER 20 MEQ PO TBCR
20.0000 meq | EXTENDED_RELEASE_TABLET | Freq: Two times a day (BID) | ORAL | Status: DC
  Administered 2024-05-08 (×2): 20 meq via ORAL
  Filled 2024-05-08 (×2): qty 1

## 2024-05-08 MED ORDER — MAGNESIUM SULFATE 2 GM/50ML IV SOLN
2.0000 g | Freq: Once | INTRAVENOUS | Status: AC
  Administered 2024-05-08: 2 g via INTRAVENOUS
  Filled 2024-05-08: qty 50

## 2024-05-08 MED ORDER — FUROSEMIDE 10 MG/ML IJ SOLN: 40.0000 mg | Freq: Two times a day (BID) | INTRAMUSCULAR | Status: DC

## 2024-05-08 NOTE — Progress Notes (Signed)
      [address] [employer] [address]             [phone]                5 Days Post-Op Procedure(s) (LRB): REPLACEMENT, AORTIC VALVE, OPEN USING INSPIRIS RESILIA AORTIC VALVE (N/A) ECHOCARDIOGRAM, TRANSESOPHAGEAL, INTRAOPERATIVE (N/A)   Events: No events Still has some SOB _______________________________________________________________ Vitals: BP 116/67   Pulse 89   Temp 98.9 F (37.2 C) (Oral)   Resp 15   Ht 5' 2 (1.575 m)   Wt 68.6 kg   LMP 10/21/2016   SpO2 95%   BMI 27.66 kg/m  Filed Weights   05/06/24 0500 05/07/24 0500 05/08/24 0500  Weight: 68.7 kg 67.8 kg 68.6 kg     - Neuro: alert NAD  - Cardiovascular: sinus  Drips: none    - Pulm: EWOB    ABG    Component Value Date/Time   PHART 7.286 (L) 05/03/2024 1736   PCO2ART 48.8 (H) 05/03/2024 1736   PO2ART 146 (H) 05/03/2024 1736   HCO3 23.1 05/03/2024 1736   TCO2 25 05/03/2024 1736   ACIDBASEDEF 3.0 (H) 05/03/2024 1736   O2SAT 99 05/03/2024 1736    - Abd: ND - Extremity: trace edema  .Intake/Output      12/06 0701 12/07 0700 12/07 0701 12/08 0700   P.O. 490    I.V. (mL/kg) 81.5 (1.2) 10 (0.1)   IV Piggyback 5    Total Intake(mL/kg) 576.5 (8.4) 10 (0.1)   Urine (mL/kg/hr)     Total Output     Net +576.5 +10        Urine Occurrence 2 x       _______________________________________________________________ Labs:    Latest Ref Rng & Units 05/08/2024    2:54 AM 05/07/2024    5:41 AM 05/06/2024    3:34 AM  CBC  WBC 4.0 - 10.5 K/uL 10.3  9.5  10.1   Hemoglobin 12.0 - 15.0 g/dL 9.2  9.0  8.7   Hematocrit 36.0 - 46.0 % 28.0  28.0  26.3   Platelets 150 - 400 K/uL 299  249  171       Latest Ref Rng & Units 05/08/2024    2:54 AM 05/07/2024    5:41 AM 05/06/2024    3:34 AM  CMP  Glucose 70 - 99 mg/dL 97  897  886   BUN 6 - 20 mg/dL 9  9  11    Creatinine 0.44 - 1.00 mg/dL 9.41  9.42  9.24   Sodium 135 - 145 mmol/L 140  138  138   Potassium 3.5 - 5.1  mmol/L 4.0  3.7  3.7   Chloride 98 - 111 mmol/L 107  103  103   CO2 22 - 32 mmol/L 24  29  27    Calcium 8.9 - 10.3 mg/dL 8.4  8.1  7.6     CXR: -  _______________________________________________________________  Assessment and Plan: POD 5 s/p AVR  Neuro: pain controlled CV: starting low dose BB.  PO amio Pulm: IS, ambulation Renal: creat stable.  8lbs up.  Will diurese GI: on diet Heme: stable ID: afebrile Endo: SSI Dispo: awaiting floor bed   [name] O [name] 05/08/2024 9:27 AM

## 2024-05-08 NOTE — Plan of Care (Signed)
  Problem: Elimination: Goal: Will not experience complications related to bowel motility Outcome: Progressing   Problem: Pain Managment: Goal: General experience of comfort will improve and/or be controlled Outcome: Progressing   Problem: Activity: Goal: Risk for activity intolerance will decrease Outcome: Progressing   Problem: Cardiac: Goal: Will achieve and/or maintain hemodynamic stability Outcome: Progressing   Problem: Respiratory: Goal: Respiratory status will improve Outcome: Progressing   Problem: Urinary Elimination: Goal: Ability to achieve and maintain adequate renal perfusion and functioning will improve Outcome: Progressing   Problem: Skin Integrity: Goal: Risk for impaired skin integrity will decrease Outcome: Progressing

## 2024-05-08 NOTE — Plan of Care (Signed)

## 2024-05-09 ENCOUNTER — Inpatient Hospital Stay (HOSPITAL_COMMUNITY)

## 2024-05-09 ENCOUNTER — Ambulatory Visit

## 2024-05-09 LAB — CBC
HCT: 31 % — ABNORMAL LOW (ref 36.0–46.0)
Hemoglobin: 10.1 g/dL — ABNORMAL LOW (ref 12.0–15.0)
MCH: 29.3 pg (ref 26.0–34.0)
MCHC: 32.6 g/dL (ref 30.0–36.0)
MCV: 89.9 fL (ref 80.0–100.0)
Platelets: 376 K/uL (ref 150–400)
RBC: 3.45 MIL/uL — ABNORMAL LOW (ref 3.87–5.11)
RDW: 13.3 % (ref 11.5–15.5)
WBC: 10.6 K/uL — ABNORMAL HIGH (ref 4.0–10.5)
nRBC: 0.3 % — ABNORMAL HIGH (ref 0.0–0.2)

## 2024-05-09 LAB — BASIC METABOLIC PANEL WITH GFR
Anion gap: 10 (ref 5–15)
BUN: 10 mg/dL (ref 6–20)
CO2: 30 mmol/L (ref 22–32)
Calcium: 8.8 mg/dL — ABNORMAL LOW (ref 8.9–10.3)
Chloride: 100 mmol/L (ref 98–111)
Creatinine, Ser: 0.75 mg/dL (ref 0.44–1.00)
GFR, Estimated: >60 mL/min (ref >60)
Glucose, Bld: 112 mg/dL — ABNORMAL HIGH (ref 70–99)
Potassium: 4.3 mmol/L (ref 3.5–5.1)
Sodium: 140 mmol/L (ref 135–145)

## 2024-05-09 LAB — GLUCOSE, CAPILLARY: Glucose-Capillary: 93 mg/dL (ref 70–99)

## 2024-05-09 MED ORDER — METOPROLOL TARTRATE 25 MG PO TABS
25.0000 mg | ORAL_TABLET | Freq: Two times a day (BID) | ORAL | Status: DC
  Administered 2024-05-09 – 2024-05-10 (×3): 25 mg via ORAL
  Filled 2024-05-09 (×3): qty 1

## 2024-05-09 MED ORDER — NYSTATIN 100000 UNIT/ML MT SUSP
5.0000 mL | Freq: Four times a day (QID) | OROMUCOSAL | Status: DC
  Administered 2024-05-09 – 2024-05-10 (×3): 500000 [IU] via ORAL
  Filled 2024-05-09 (×3): qty 5

## 2024-05-09 MED ORDER — ACETAMINOPHEN 325 MG PO TABS
650.0000 mg | ORAL_TABLET | Freq: Four times a day (QID) | ORAL | Status: DC | PRN
  Administered 2024-05-09 – 2024-05-10 (×2): 650 mg via ORAL
  Filled 2024-05-09 (×2): qty 2

## 2024-05-09 MED ORDER — FUROSEMIDE 40 MG PO TABS
40.0000 mg | ORAL_TABLET | Freq: Every day | ORAL | Status: DC
  Administered 2024-05-09 – 2024-05-10 (×2): 40 mg via ORAL
  Filled 2024-05-09 (×2): qty 1

## 2024-05-09 MED ORDER — POTASSIUM CHLORIDE CRYS ER 20 MEQ PO TBCR
20.0000 meq | EXTENDED_RELEASE_TABLET | Freq: Every day | ORAL | Status: DC
  Administered 2024-05-09 – 2024-05-10 (×2): 20 meq via ORAL
  Filled 2024-05-09 (×2): qty 1

## 2024-05-09 NOTE — Progress Notes (Addendum)
                  [address]           Thurmon BROCKS [place], [place] [address]                     [phone]        6 Days Post-Op Procedure(s) (LRB): REPLACEMENT, AORTIC VALVE, OPEN USING INSPIRIS RESILIA AORTIC VALVE (N/A) ECHOCARDIOGRAM, TRANSESOPHAGEAL, INTRAOPERATIVE (N/A)  Subjective: Patient sitting in chair. She just returned from a walk. She has complaint of shortness of breath with exertion.  Objective: Vital signs in last 24 hours: Temp:  [98.1 F (36.7 C)-98.5 F (36.9 C)] 98.5 F (36.9 C) (12/08 0234) Pulse Rate:  [76-127] 81 (12/08 0234) Cardiac Rhythm: Normal sinus rhythm (12/07 2100) Resp:  [15-32] 20 (12/08 0234) BP: (101-131)/(51-77) 131/68 (12/08 0234) SpO2:  [93 %-99 %] 96 % (12/08 0234) Weight:  [66 kg] 66 kg (12/08 0234)  Pre op weight 64.6 kg Current Weight  05/09/24 66 kg      Intake/Output from previous day: 12/07 0701 - 12/08 0700 In: 780 [P.O.:720; I.V.:10; IV Piggyback:50] Out: 3750 [Urine:3750]   Physical Exam:  Cardiovascular: Slightly tachycardic Pulmonary: Diminished bibasilar breath sounds Abdomen: Soft, non tender, bowel sounds present. Extremities: Mild bilateral lower extremity edema. Wound: Clean and dry.  No erythema or signs of infection.  Lab Results: CBC: Recent Labs    05/08/24 0254 05/09/24 0344  WBC 10.3 10.6*  HGB 9.2* 10.1*  HCT 28.0* 31.0*  PLT 299 376   BMET:  Recent Labs    05/08/24 0254 05/09/24 0344  NA 140 140  K 4.0 4.3  CL 107 100  CO2 24 30  GLUCOSE 97 112*  BUN 9 10  CREATININE 0.58 0.75  CALCIUM 8.4* 8.8*    PT/INR:  Lab Results  Component Value Date   INR 1.4 (H) 05/03/2024   INR 0.9 04/27/2024   ABG:  INR: Will add last result for INR, ABG once components are confirmed Will add last 4 CBG results once components are confirmed  Assessment/Plan:  1. CV - Previous a fib with RVR. ST with HR low 100's. On Amiodarone  400 mg bid and Lopressor  12.5 mg bid. Will  increase Lopressor  to 25 mg bid. Monitor QTc (discussed with Dr. Lucas). 2.  Pulmonary - On room air. Check PA/LAT CXR. Encourage incentive spirometer and flutter valve. 3. Above pre op weight, requires further diuresis-Lasix  40 mg daily 4.  Expected post op acute blood loss anemia - H and H this am stable at 10.1 and 31. 5. On Lovenox  for DVT prophylaxis 6. On Meclizine  for vertigo 7. CBGs 81/98/93. Pre op HGA1C 5.6. Stop accu checks and SS PRN 8. Thrush present on tongue-Nystatin  9. Disposition-hopefully, home in am  Donielle M ZimmermanPA-C 7:16 AM   Chart reviewed, patient examined, agree with above.  She feels much better this afternoon. Ambulating well.  CXR shows small bilateral pleural effusions. She should probably stay on lasix  for a few more days at home.

## 2024-05-09 NOTE — Progress Notes (Signed)
 Occupational Therapy Treatment Patient Details Name: [name] MRN: [id number] DOB: April 26, 1971 Today's Date: 05/09/2024   History of present illness Pt is a 53 y.o female admitted 12/02 for scheduled AVR and TEE. PMH: aortic stenosis, mitral valve prolapse   OT comments  Pt reported they wanted to work with ADLs for today's session. Pt completed room level ambulation with supervision and ADLS post set up with supervision. She was educated about positioning to decrease in fall risk and sternal precautions and was able to demonstrate in session. At this time Acute Occupational Therapy to follow and recommendation for cardiac OP.       If plan is discharge home, recommend the following:  Assistance with cooking/housework   Equipment Recommendations  BSC/3in1    Recommendations for Other Services      Precautions / Restrictions Precautions Precautions: Sternal;Fall Precaution Booklet Issued: No Recall of Precautions/Restrictions: Intact Restrictions Weight Bearing Restrictions Per Provider Order: Yes RUE Weight Bearing Per Provider Order: Non weight bearing LUE Weight Bearing Per Provider Order: Non weight bearing Other Position/Activity Restrictions: Cardiac sternal precautions       Mobility Bed Mobility Overal bed mobility: Modified Independent Bed Mobility: Sit to Supine     Supine to sit: Modified independent (Device/Increase time)          Transfers Overall transfer level: Modified independent Equipment used: None Transfers: Sit to/from Stand Sit to Stand: Supervision           General transfer comment: cue on pacing     Balance Overall balance assessment: Mild deficits observed, not formally tested                                         ADL either performed or assessed with clinical judgement   ADL Overall ADL's : Needs assistance/impaired Eating/Feeding: Sitting;Independent;Supervision/ safety;Set up   Grooming:  Wash/dry hands;Wash/dry face;Supervision/safety;Standing   Upper Body Bathing: Supervision/ safety;Sitting;Standing   Lower Body Bathing: Supervison/ safety;Sit to/from stand   Upper Body Dressing : Supervision/safety;Sitting;Standing   Lower Body Dressing: Supervision/safety;Sit to/from stand   Toilet Transfer: Supervision/safety   Toileting- Architect and Hygiene: Supervision/safety   Tub/ Engineer, Structural: Supervision/safety   Functional mobility during ADLs: Supervision/safety      Extremity/Trunk Assessment Upper Extremity Assessment Upper Extremity Assessment:  (Due to sternal precautions limited ROm but reported limited AROM at baseline due to recent shoulder sxs)   Lower Extremity Assessment Lower Extremity Assessment: Defer to PT evaluation        Vision   Vision Assessment?: No apparent visual deficits   Perception     Praxis     Communication Communication Communication: No apparent difficulties   Cognition Arousal: Alert Behavior During Therapy: WFL for tasks assessed/performed Cognition: No apparent impairments                               Following commands: Intact        Cueing   Cueing Techniques: Verbal cues  Exercises      Shoulder Instructions       General Comments on RA, Max HR 110 with ADLS in standing    Pertinent Vitals/ Pain       Pain Assessment Pain Assessment: No/denies pain Facial Expression: Relaxed, neutral Body Movements: Absence of movements Muscle Tension: Relaxed Compliance with ventilator (intubated pts.): N/A Vocalization (extubated  pts.): N/A CPOT Total: 0  Home Living                                          Prior Functioning/Environment              Frequency  Min 2X/week        Progress Toward Goals  OT Goals(current goals can now be found in the care plan section)  Progress towards OT goals: Progressing toward goals  Acute Rehab OT  Goals Patient Stated Goal: to go home OT Goal Formulation: With patient Time For Goal Achievement: 05/19/24 Potential to Achieve Goals: Good ADL Goals Pt Will Perform Grooming: with modified independence;standing Pt Will Perform Lower Body Dressing: with modified independence;sit to/from stand;sitting/lateral leans Pt Will Transfer to Toilet: with modified independence;ambulating;regular height toilet Pt Will Perform Toileting - Clothing Manipulation and hygiene: with modified independence;sitting/lateral leans;sit to/from stand Pt Will Perform Tub/Shower Transfer: Shower transfer;with modified independence;shower seat Additional ADL Goal #1: Pt will verbalize at least 3 energy conservation strategies for ADLs Additional ADL Goal #2: Pt will tolerate at least 10 minutes of standing activities at mod I to increase activity tolerance for ADLs  Plan      Co-evaluation                 AM-PAC OT 6 Clicks Daily Activity     Outcome Measure   Help from another person eating meals?: None Help from another person taking care of personal grooming?: None Help from another person toileting, which includes using toliet, bedpan, or urinal?: A Little Help from another person bathing (including washing, rinsing, drying)?: A Little Help from another person to put on and taking off regular upper body clothing?: None Help from another person to put on and taking off regular lower body clothing?: A Little 6 Click Score: 21    End of Session    OT Visit Diagnosis: Other (comment) (cardiopulmonary)   Activity Tolerance Patient tolerated treatment well   Patient Left in bed;with call bell/phone within reach   Nurse Communication Mobility status        Time: 9151-9083 OT Time Calculation (min): 28 min  Charges: OT General Charges $OT Visit: 1 Visit OT Treatments $Self Care/Home Management : 23-37 mins  Warrick POUR OTR/L  Acute Rehab Services  [phone] office number   Warrick Berber 05/09/2024, 9:23 AM

## 2024-05-09 NOTE — TOC Progression Note (Signed)
 Transition of Care (TOC) - Progression Note  Rayfield Gobble RN, BSN Inpatient Care Management Unit 4E- RN Case Manager See Treatment Team for direct phone #   Patient Details  Name: [name] MRN: [id number] Date of Birth: [birth year]  Transition of Care [facility]) CM/SW Contact  Gobble, Rayfield Hurst, RN Phone Number: 05/09/2024, 12:37 PM  Clinical Narrative:    S/p AVR, noted DME orders placed for RW, BSC, and tub bench.  CM spoke with pt at bedside- discussed orders for DME- per pt she has not been using RW for the last several days- states she no longer feels she needs one for discharge. Pt states she would like to have either BSC or tub bench if insurance covers- explained that insurance would not cover either of these- pt then voiced she would follow up and purchase on her own or see if she could find one to borrow if needed.   Declined DME for discharge.   Pt voiced she had no other concerns or needs for CM, family to transport home.    Expected Discharge Plan: Home/Self Care Barriers to Discharge: Barriers Resolved               Expected Discharge Plan and Services In-house Referral: NA Discharge Planning Services: CM Consult Post Acute Care Choice: Durable Medical Equipment Living arrangements for the past 2 months: Single Family Home                 DME Arranged: Bedside commode, Tub bench, Walker rolling, Patient refused services DME Agency: NA       HH Arranged: NA HH Agency: NA         Social Drivers of Health (SDOH) Interventions SDOH Screenings   Food Insecurity: No Food Insecurity (05/08/2024)  Housing: Low Risk  (05/08/2024)  Transportation Needs: No Transportation Needs (05/08/2024)  Utilities: Not At Risk (05/08/2024)  Financial Resource Strain: Low Risk  (02/02/2024)   Received from [facility]  Tobacco Use: Low Risk  (05/03/2024)    Readmission Risk Interventions    05/09/2024   12:37 PM  Readmission Risk  Prevention Plan  Post Dischage Appt Complete  Medication Screening Complete  Transportation Screening Complete

## 2024-05-10 ENCOUNTER — Other Ambulatory Visit (HOSPITAL_COMMUNITY): Payer: Self-pay

## 2024-05-10 MED ORDER — METOPROLOL TARTRATE 25 MG PO TABS
25.0000 mg | ORAL_TABLET | Freq: Two times a day (BID) | ORAL | 1 refills | Status: DC
  Filled 2024-05-10: qty 60, 30d supply, fill #0

## 2024-05-10 MED ORDER — TRAMADOL HCL 50 MG PO TABS
50.0000 mg | ORAL_TABLET | Freq: Four times a day (QID) | ORAL | 0 refills | Status: AC | PRN
  Filled 2024-05-10: qty 28, 7d supply, fill #0

## 2024-05-10 MED ORDER — FUROSEMIDE 40 MG PO TABS
40.0000 mg | ORAL_TABLET | Freq: Every day | ORAL | 0 refills | Status: DC
  Filled 2024-05-10: qty 5, 5d supply, fill #0

## 2024-05-10 MED ORDER — NYSTATIN 100000 UNIT/ML MT SUSP
5.0000 mL | Freq: Four times a day (QID) | OROMUCOSAL | 0 refills | Status: AC
  Filled 2024-05-10: qty 60, 3d supply, fill #0

## 2024-05-10 MED ORDER — POTASSIUM CHLORIDE CRYS ER 20 MEQ PO TBCR
20.0000 meq | EXTENDED_RELEASE_TABLET | Freq: Every day | ORAL | 0 refills | Status: AC
  Filled 2024-05-10: qty 5, 5d supply, fill #0

## 2024-05-10 MED ORDER — ASPIRIN 325 MG PO TBEC: 325.0000 mg | DELAYED_RELEASE_TABLET | Freq: Every day | ORAL | Status: AC

## 2024-05-10 MED ORDER — AMIODARONE HCL 200 MG PO TABS: ORAL_TABLET | ORAL | Status: DC

## 2024-05-10 MED ORDER — AMIODARONE HCL 200 MG PO TABS
ORAL_TABLET | ORAL | 1 refills | Status: DC
  Filled 2024-05-10: qty 60, 50d supply, fill #0

## 2024-05-10 NOTE — Progress Notes (Signed)
 CARDIAC REHAB PHASE I     Post OHS education including site care, restrictions, heart healthy diet, sternal precautions, IS use at home, home needs at discharge, exercise guidelines and CRP2 reviewed. All questions and concerns addressed. Will refer to [facility] for CRP2. Plan for discharge home later today.   [id number] Vaughn Asberry Hacking, RN BSN 05/10/2024 9:17 AM

## 2024-05-10 NOTE — TOC Transition Note (Signed)
 Transition of Care (TOC) - Discharge Note Rayfield Gobble RN, BSN Inpatient Care Management Unit 4E- RN Case Manager See Treatment Team for direct phone #   Patient Details  Name: [name] MRN: [id number] Date of Birth: 30-Sep-1970  Transition of Care [facility]) CM/SW Contact:  Gobble Rayfield Hurst, RN Phone Number: 05/10/2024, 9:46 AM   Clinical Narrative:    Pt stable for transition home today, no further IP CM needs noted. Pt declined DME for discharge.   Family to transport home, Pt will follow AVS instructions for post op follow up.   RNCM will sign off as interventions have been completed for discharge.   Final next level of care: Home/Self Care Barriers to Discharge: Barriers Resolved   Patient Goals and CMS Choice Patient states their goals for this hospitalization and ongoing recovery are:: return home and recover   Choice offered to / list presented to : NA      Discharge Placement               Home        Discharge Plan and Services Additional resources added to the After Visit Summary for   In-house Referral: NA Discharge Planning Services: CM Consult Post Acute Care Choice: Durable Medical Equipment          DME Arranged: Bedside commode, Tub bench, Walker rolling, Patient refused services DME Agency: NA       HH Arranged: NA HH Agency: NA        Social Drivers of Health (SDOH) Interventions SDOH Screenings   Food Insecurity: No Food Insecurity (05/08/2024)  Housing: Low Risk  (05/08/2024)  Transportation Needs: No Transportation Needs (05/08/2024)  Utilities: Not At Risk (05/08/2024)  Financial Resource Strain: Low Risk  (02/02/2024)   Received from [facility]  Tobacco Use: Low Risk  (05/03/2024)     Readmission Risk Interventions    05/09/2024   12:37 PM  Readmission Risk Prevention Plan  Post Dischage Appt Complete  Medication Screening Complete  Transportation Screening Complete

## 2024-05-10 NOTE — Progress Notes (Signed)
 PT Cancellation Note  Patient Details Name: [name] MRN: [id number] DOB: 1970-07-08   Cancelled Treatment:    Reason Eval/Treat Not Completed: (P) Patient declined, no reason specified, pt up ambulating independently in room on arrival, declining PT session. Pt without questions or concerns. Will check back as schedule allows to continue with PT POC.  Therisa SAUNDERS. PTA Acute Rehabilitation Services Office: [phone]    Therisa CHRISTELLA Boor 05/10/2024, 9:47 AM

## 2024-05-10 NOTE — Progress Notes (Signed)
 Amiodarone  dose verified with Donielle PA. Instructed patient to take 400 mg tonight and start taking 200 mg bid starting tomorrow morning. Patient voiced understanding, patient discharged home with family via private auto.

## 2024-05-10 NOTE — Progress Notes (Signed)
 DISCHARGE NOTE HOME [name] to be discharged Home per MD order. Discussed prescriptions and follow up appointments with the patient. Prescriptions given to patient; medication list explained in detail. Patient verbalized understanding.  Skin clean, dry and intact without evidence of skin break down, no evidence of skin tears noted. IV catheter discontinued intact. Site without signs and symptoms of complications. Dressing and pressure applied. Pt denies pain at the site currently. No complaints noted.  See Lda for sugical incision chest at dischargePatient free of lines, drains, and wounds.   An After Visit Summary (AVS) was printed and given to the patient. Patient escorted via wheelchair, and discharged home via private auto.  Peyton [name], RN

## 2024-05-10 NOTE — Progress Notes (Addendum)
                  [address]           Thurmon BROCKS [place], [place] [address]                     [phone]        7 Days Post-Op Procedure(s) (LRB): REPLACEMENT, AORTIC VALVE, OPEN USING INSPIRIS RESILIA AORTIC VALVE (N/A) ECHOCARDIOGRAM, TRANSESOPHAGEAL, INTRAOPERATIVE (N/A)  Subjective: Patient about to go walking. She is feeling well and hopes to go home.  Objective: Vital signs in last 24 hours: Temp:  [98 F (36.7 C)-98.8 F (37.1 C)] 98.4 F (36.9 C) (12/09 0436) Pulse Rate:  [82-91] 83 (12/09 0436) Cardiac Rhythm: Normal sinus rhythm (12/08 1900) Resp:  [12-18] 17 (12/09 0436) BP: (99-113)/(45-68) 107/60 (12/09 0436) SpO2:  [93 %-97 %] 96 % (12/09 0436) Weight:  [65 kg] 65 kg (12/09 0436)  Pre op weight 64.6 kg Current Weight  05/10/24 65 kg      Intake/Output from previous day: 12/08 0701 - 12/09 0700 In: 3 [I.V.:3] Out: -    Physical Exam:  Cardiovascular: RRR Pulmonary: Slightly diminished bibasilar breath sounds Abdomen: Soft, non tender, bowel sounds present. Extremities: Trace bilateral lower extremity edema. Wound: Clean and dry.  No erythema or signs of infection.  Lab Results: CBC: Recent Labs    05/08/24 0254 05/09/24 0344  WBC 10.3 10.6*  HGB 9.2* 10.1*  HCT 28.0* 31.0*  PLT 299 376   BMET:  Recent Labs    05/08/24 0254 05/09/24 0344  NA 140 140  K 4.0 4.3  CL 107 100  CO2 24 30  GLUCOSE 97 112*  BUN 9 10  CREATININE 0.58 0.75  CALCIUM 8.4* 8.8*    PT/INR:  Lab Results  Component Value Date   INR 1.4 (H) 05/03/2024   INR 0.9 04/27/2024   ABG:  INR: Will add last result for INR, ABG once components are confirmed Will add last 4 CBG results once components are confirmed  Assessment/Plan:  1. CV - Previous a fib with RVR. She had one episode of increased HR into 120's briefly around 5 am;SR since then. On Amiodarone  400 mg bid and Lopressor  25 mg bid. 2.  Pulmonary - On room air. Encourage incentive  spirometer and flutter valve. 3. Above pre op weight, requires further diuresis-Lasix  40 mg daily and will continue at discharge 4.  Expected post op acute blood loss anemia - H and H yesterday stable at 10.1 and 31. 5. On Lovenox  for DVT prophylaxis 6. Thrush present on tongue-Nystatin  7. Disposition-home later today  Donielle M ZimmermanPA-C 7:10 AM    Chart reviewed, patient examined, agree with above.  She had brief episode of SVT overnight but only 10 beats. She should be fine to go home on amio and Lopressor . Will continue lasix  and KCL for a week.

## 2024-05-11 ENCOUNTER — Other Ambulatory Visit: Payer: Self-pay

## 2024-05-11 ENCOUNTER — Other Ambulatory Visit: Payer: Self-pay | Admitting: Physician Assistant

## 2024-05-11 ENCOUNTER — Other Ambulatory Visit: Payer: Self-pay | Admitting: *Deleted

## 2024-05-11 MED ORDER — AMIODARONE HCL 200 MG PO TABS
ORAL_TABLET | ORAL | 0 refills | Status: DC
  Filled 2024-05-11: qty 40, 30d supply, fill #0

## 2024-05-11 NOTE — Progress Notes (Signed)
 Medication placed as No Print by CHARM Donald, PA at discharge. Medication re-entered and sent to patient's preferred pharmacy.

## 2024-05-17 ENCOUNTER — Other Ambulatory Visit: Payer: Self-pay

## 2024-05-17 ENCOUNTER — Telehealth: Payer: Self-pay

## 2024-05-17 ENCOUNTER — Other Ambulatory Visit: Payer: Self-pay | Admitting: Surgery

## 2024-05-17 DIAGNOSIS — I350 Nonrheumatic aortic (valve) stenosis: Secondary | ICD-10-CM

## 2024-05-17 MED ORDER — POTASSIUM CHLORIDE ER 10 MEQ PO CPCR
20.0000 meq | ORAL_CAPSULE | Freq: Every day | ORAL | 0 refills | Status: AC
  Filled 2024-05-17: qty 14, 7d supply, fill #0

## 2024-05-17 MED ORDER — FUROSEMIDE 40 MG PO TABS
40.0000 mg | ORAL_TABLET | Freq: Every day | ORAL | 0 refills | Status: DC
  Filled 2024-05-17: qty 7, 7d supply, fill #0

## 2024-05-17 NOTE — Telephone Encounter (Signed)
 Patient contacted the office to ask if she could get a chest xray before her appointment tomorrow, 12/17 with Dr. Lucas. She is s/p AVR. She does have shortness of breath with active, minor swelling in her ankles, and a dry cough. She does say she is back to her pre op weight. Spoke with Manuelita, [place] who states patient can restart Lasix  (previously stopped Sunday after 5 days) 40mg  x7 days with 20 Meq potassium until she sees Dr. Lucas in clinic tomorrow for reevaluation/ f/u. Patient aware and acknowledged receipt.

## 2024-05-18 ENCOUNTER — Ambulatory Visit (HOSPITAL_COMMUNITY): Admission: RE | Admit: 2024-05-18 | Discharge: 2024-05-18 | Attending: Cardiology | Admitting: Cardiology

## 2024-05-18 ENCOUNTER — Ambulatory Visit: Payer: Self-pay

## 2024-05-18 VITALS — BP 134/68 | HR 80 | Resp 20 | Ht 62.0 in | Wt 144.0 lb

## 2024-05-18 DIAGNOSIS — Z48812 Encounter for surgical aftercare following surgery on the circulatory system: Secondary | ICD-10-CM | POA: Diagnosis not present

## 2024-05-18 DIAGNOSIS — J90 Pleural effusion, not elsewhere classified: Secondary | ICD-10-CM | POA: Diagnosis not present

## 2024-05-18 DIAGNOSIS — I350 Nonrheumatic aortic (valve) stenosis: Secondary | ICD-10-CM | POA: Diagnosis not present

## 2024-05-18 DIAGNOSIS — J9811 Atelectasis: Secondary | ICD-10-CM | POA: Diagnosis not present

## 2024-05-18 DIAGNOSIS — Z953 Presence of xenogenic heart valve: Secondary | ICD-10-CM

## 2024-05-18 NOTE — Progress Notes (Unsigned)
 [address] [place] [address]             [phone]       HPI:  Patient returns for routine postoperative follow-up having undergone  Aortic valve replacement using a 21 mm Edwards INSPIRIS RESILIA pericardial valve on 05/03/2024 with Dr. Lucas.  The patient's early postoperative recovery while in the hospital was notable for having atrial fibrillation and was chemically converted back to normal sinus rhythm with amiodarone . She was routinely diuresed. She was stable for discharge home on 05/10/2024.   Since hospital discharge the patient reports Tramadol  Tylenol  Fell up the stairs no problems   Very active .  Allergies as of 05/18/2024   No Known Allergies      Medication List        Accurate as of May 18, 2024  4:06 PM. If you have any questions, ask your nurse or doctor.          Acetaminophen  Extra Strength 500 MG Tabs Commonly known as: TYLENOL  Take 2 tablets (1,000 mg total) by mouth every 8 (eight) hours.   amiodarone  200 MG tablet Commonly known as: PACERONE  Take 1 tablet (200 mg) by mouth twice a day for 10 days, then take 1 tablet (200 mg) by mouth daily.   aspirin  EC 325 MG tablet Take 1 tablet (325 mg total) by mouth daily.   B COMPLEX PO Take 1 tablet by mouth daily.   cyclobenzaprine  5 MG tablet Commonly known as: FLEXERIL  Take 5 mg by mouth 3 (three) times daily as needed for muscle spasms.   D3 5000 PO Take 2,000 Units by mouth daily.   furosemide  40 MG tablet Commonly known as: LASIX  Take 1 tablet (40 mg total) by mouth daily for 7 days then stop.   HAIR SKIN & NAILS GUMMIES PO Take 2 each by mouth daily.   ibuprofen 200 MG tablet Commonly known as: ADVIL Take 200-600 mg by mouth every 6 (six) hours as needed for mild pain (pain score 1-3).   metoprolol  tartrate 25 MG tablet Commonly known as: LOPRESSOR  Take 1 tablet (25 mg total) by mouth 2 (two) times daily.   Multiple Vitamins  tablet Take 1 tablet by mouth daily.   nystatin  100000 UNIT/ML suspension Commonly known as: MYCOSTATIN  Take 5 mLs (500,000 Units total) by mouth 4 (four) times daily.   ondansetron  8 MG disintegrating tablet Commonly known as: ZOFRAN -ODT Take 8 mg by mouth every 8 (eight) hours as needed for nausea or vomiting.   potassium chloride  10 MEQ CR capsule Commonly known as: MICRO-K  Take 2 capsules (20 mEq total) by mouth daily.   potassium chloride  SA 20 MEQ tablet Commonly known as: KLOR-CON  M Take 1 tablet (20 mEq total) by mouth daily. For 5 days then stop.   rizatriptan  10 MG tablet Commonly known as: MAXALT  Take 1 tablet (10 mg total) by mouth every 2 (two) hours as needed. Max 2 tabs per day   traMADol  50 MG tablet Commonly known as: ULTRAM  Take 1 tablet (50 mg total) by mouth every 6 (six) hours as needed for moderate pain (pain score 4-6).         ROS ROS    BP 134/68   Pulse 80   Resp 20   Ht 5' 2 (1.575 m)   Wt 144 lb (65.3 kg)   LMP 10/21/2016   SpO2 99% Comment: RA  BMI 26.34 kg/m  Physical Exam    Imaging: CLINICAL DATA:  Status post aortic valve replacement.   EXAM: CHEST - 2 VIEW   COMPARISON:  Chest radiograph dated 05/09/2024.   FINDINGS: Small bilateral pleural effusions and bibasilar atelectasis. Pneumonia is not excluded. No pneumothorax. Stable cardiac silhouette. Median sternotomy wires and mechanical aortic valve. No acute osseous pathology.   IMPRESSION: Small bilateral pleural effusions and bibasilar atelectasis.     Electronically Signed   By: Vanetta Chou M.D.   On: 05/18/2024 15:43   Assessment/Plan:    Manuelita CHRISTELLA Rough, PA-C 4:06 PM 05/18/2024

## 2024-05-19 NOTE — Patient Instructions (Signed)
Follow up in 3 weeks with chest xray

## 2024-05-24 ENCOUNTER — Ambulatory Visit

## 2024-05-24 ENCOUNTER — Other Ambulatory Visit: Payer: Self-pay

## 2024-05-24 ENCOUNTER — Ambulatory Visit: Attending: Medical | Admitting: Medical

## 2024-05-24 ENCOUNTER — Encounter: Payer: Self-pay | Admitting: Medical

## 2024-05-24 VITALS — BP 98/60 | HR 68 | Ht 62.0 in | Wt 142.4 lb

## 2024-05-24 DIAGNOSIS — I9589 Other hypotension: Secondary | ICD-10-CM

## 2024-05-24 DIAGNOSIS — I4891 Unspecified atrial fibrillation: Secondary | ICD-10-CM | POA: Diagnosis not present

## 2024-05-24 DIAGNOSIS — Z953 Presence of xenogenic heart valve: Secondary | ICD-10-CM | POA: Diagnosis not present

## 2024-05-24 DIAGNOSIS — M7989 Other specified soft tissue disorders: Secondary | ICD-10-CM

## 2024-05-24 MED ORDER — AMIODARONE HCL 100 MG PO TABS
100.0000 mg | ORAL_TABLET | Freq: Every day | ORAL | 1 refills | Status: AC
  Filled 2024-05-24: qty 90, 90d supply, fill #0

## 2024-05-24 MED ORDER — FUROSEMIDE 20 MG PO TABS
20.0000 mg | ORAL_TABLET | Freq: Every day | ORAL | 3 refills | Status: AC | PRN
  Filled 2024-05-24: qty 30, 30d supply, fill #0

## 2024-05-24 MED ORDER — METOPROLOL SUCCINATE ER 25 MG PO TB24
12.5000 mg | ORAL_TABLET | Freq: Every day | ORAL | 3 refills | Status: AC
  Filled 2024-05-24: qty 45, 90d supply, fill #0

## 2024-05-24 NOTE — Progress Notes (Signed)
 " Cardiology Office Note   Date:  05/24/2024  ID:  [name], [name] 1970-12-14, MRN [id number] PCP: Sherial Bail, MD  [facility] Providers Cardiologist:  None    History of Present Illness [name] is a 53 y.o. female with a h/o palpitations, mitral valve prolapse, severe AS status post aortic valve replacement on 05/03/2024, migraine who presents for 2 month follow-up.   Patient saw Dr. Gollan in 2016 for chest pain.  Murmur was appreciated.  Echo showed normal pump function, mild AS, mild calcification with a gradient of 14 mmHg, mild MR.  She was seen again 03/25/2024 for shortness of breath.  Echocardiogram showed normal pump function with calcified aortic valve, severe AS with a mean gradient of 39 mmHg.  Left heart cath showed normal coronaries.  She was referred to CT surgery for aortic valve replacement consideration.  The patient ultimately underwent aortic valve replacement with bioprosthetic valve.  Patient had postop A-fib and was started on amiodarone  and metoprolol .    Today, the patient reports she has low energy. She still gets SOB when she does too much. She feels drained all the time. She is active at home, but feels tired after any activity. EKG shows NSR with prolonged Qtc. She has minimal chest pain. She has chronic lower leg edema that is unchanged.   Studies Reviewed EKG Interpretation Date/Time:  Tuesday May 24 2024 14:38:14 EST Ventricular Rate:  68 PR Interval:  162 QRS Duration:  94 QT Interval:  518 QTC Calculation: 550 R Axis:   55  Text Interpretation:  Critical Test Result: Long QTc Normal sinus rhythm T wave abnormality, consider inferior ischemia When compared with ECG of 04-May-2024 07:10, Nonspecific T wave abnormality now evident in Anterolateral leads QT has lengthened Confirmed by Franchester, Trell Secrist ([id number]) on 05/24/2024 3:49:59 PM    Intraoperative TEE 05/2024 Complications: No known complications during  this procedure.  POST-OP IMPRESSIONS  _ Left Ventricle: has normal systolic function, with an ejection fraction  of  60%. The cavity size was normal. The wall motion is normal.  _ Right Ventricle: normal function.  _ Aorta: The aorta appears unchanged from pre-bypass.  _ Left Atrial Appendage: The left atrial appendage appears unchanged from  pre-bypass.  _ Aortic Valve: A pericardial bioprosthetic valve was placed, leaflets are  freely mobile and leaflets thin Manufactured by; [employer]  pericardial valve Size; 21mm. No regurgitation post repair. The gradient  recorded across the prosthetic valve is within the expected range.  _ Mitral Valve: The mitral valve appears unchanged from pre-bypass. There  is  trivial regurgitation.  _ Tricuspid Valve: The tricuspid valve appears unchanged from pre-bypass.  _ Pulmonic Valve: The pulmonic valve appears unchanged from pre-bypass.  _ Interatrial Septum: The interatrial septum appears unchanged from  pre-bypass.  _ Pericardium: The pericardium appears unchanged from pre-bypass.  _ Comments: Post-bypass images reviewed with surgeon.   PRE-OP FINDINGS   Left Ventricle: The left ventricle has normal systolic function, with an  ejection fraction of 60-65%. The cavity size was normal. There is no left  ventricular hypertrophy.   Heart cath 04/2024 1.  Normal coronary arteries. 2.  I did not attempt to cross the aortic valve.  Severe AS by echo 3.  Right heart catheterization showed normal right atrial pressure, high normal pulmonary pressure, mildly elevated wedge pressure at 13 mmHg and normal cardiac output.   Recommendations: Agree with surgical consultation for aortic valve replacement.  The aortic valve appears  to be bicuspid on echo.  Consider CT chest to evaluate the ascending aorta.    Echo 03/2024 1. Left ventricular ejection fraction, by estimation, is 60 to 65%. The  left ventricle has normal function. The left  ventricle has no regional  wall motion abnormalities. Left ventricular diastolic parameters are  indeterminate.   2. Right ventricular systolic function is normal. The right ventricular  size is normal.   3. The mitral valve is normal in structure. Mild mitral valve  regurgitation. No evidence of mitral stenosis.   4. The aortic valve has an indeterminant number of cusps. Aortic valve  regurgitation is not visualized. Severe aortic valve stenosis. Aortic  valve mean gradient measures 39.0 mmHg. Aortic valve Vmax measures 4.00  m/s.   5. The inferior vena cava is normal in size with greater than 50%  respiratory variability, suggesting right atrial pressure of 3 mmHg.   Physical Exam VS:  BP 98/60 (BP Location: Left Arm, Patient Position: Sitting, Cuff Size: Normal)   Pulse 68 Comment: 70 oximeter  Ht 5' 2 (1.575 m)   Wt 142 lb 6.4 oz (64.6 kg)   LMP 10/21/2016   SpO2 99%   BMI 26.05 kg/m        Wt Readings from Last 3 Encounters:  05/24/24 142 lb 6.4 oz (64.6 kg)  05/18/24 144 lb (65.3 kg)  05/10/24 143 lb 4.8 oz (65 kg)    GEN: Well nourished, well developed in no acute distress NECK: No JVD; No carotid bruits CARDIAC: RRR, + murmur, np rubs, gallops RESPIRATORY:  Clear to auscultation without rales, wheezing or rhonchi  ABDOMEN: Soft, non-tender, non-distended EXTREMITIES:  No edema; No deformity   ASSESSMENT AND PLAN  Severe AS s/p AV replacement Patient underwent AV replacement and reports she is still feeling fatigue and DOE. Scar site appears stable. Symptoms should hopefully improve after 3 months. Follow-up echo has been ordered. She is taking ASA 325mg  daily. CTCS to clear for cardiac rehab.  Post-op Afib EKG shows NSR with Qtc . I will decrease amiodarone  to 100mg  daily. I will order a 2 week heart monitor to assess for afib. Can likely stop amio at follow-up.   Hypotension Mild hypotension today. I will decrease lopressor  and convert to Toprol  12.5mg   daily.   Chronic lower leg edema I will stop lasix  40mg  and start lasix  20mg  to use as needed.      Cardiac Rehabilitation Eligibility Assessment         Dispo: Follow-up in 3 months  Signed, Jillyan Plitt VEAR Fishman, PA-C   "

## 2024-05-24 NOTE — Patient Instructions (Signed)
 Medication Instructions:  Your physician recommends the following medication changes.  STOP TAKING: Metoprolol  tartrate   START TAKING: Metoprolol  Succinate 12.5 mg by mouth daily   DECREASE: Amiodarone  100 mg by mouth daily   Lasix  to 20 mg by mouth daily as needed for   *If you need a refill on your cardiac medications before your next appointment, please call your pharmacy*  Lab Work: No labs ordered today    Testing/Procedures: ZIO XT- Long Term Monitor Instructions  Your physician has requested you wear a ZIO patch monitor for 14 days.  This is a single patch monitor. [employer] supplies one patch monitor per enrollment. Additional stickers are not available. Please do not apply patch if you will be having a Nuclear Stress Test, Echocardiogram, Cardiac CT, MRI, or Chest Xray during the period you would be wearing the monitor. The patch cannot be worn during these tests. You cannot remove and re-apply the ZIO XT patch monitor.  Your ZIO patch monitor will be mailed 3 day USPS to your address on file. It may take 3-5 days to receive your monitor after you have been enrolled. Once you have received your monitor, please review the enclosed instructions. Your monitor has already been registered assigning a specific monitor serial number to you.  Billing and Patient Assistance Program Information  We have supplied [employer] with any of your insurance information on file for billing purposes.  [employer] offers a sliding scale Patient Assistance Program for patients that do not have insurance, or whose insurance does not completely cover the cost of the ZIO monitor.  You must apply for the Patient Assistance Program to qualify for this discounted rate.  To apply, please call [employer] at [phone], select option 4, select option 2, ask to apply for Patient Assistance Program. Meredeth will ask your household income, and how many people are in your household. They will quote your out-of-pocket cost  based on that information. [employer] will also be able to set up a 32-month, interest-free payment plan if needed.  Applying the monitor   Shave hair from upper left chest.  Hold abrader disc by orange tab. Rub abrader in 40 strokes over the upper left chest as indicated in your monitor instructions.  Clean area with 4 enclosed alcohol pads. Let dry.  Apply patch as indicated in monitor instructions. Patch will be placed under collarbone on left side of chest with arrow pointing upward.  Rub patch adhesive wings for 2 minutes. Remove white label marked 1. Remove the white label marked 2. Rub patch adhesive wings for 2 additional minutes.  While looking in a mirror, press and release button in center of patch. A small green light will flash 3-4 times. This will be your only indicator that the monitor has been turned on.  Do not shower for the first 24 hours. You may shower after the first 24 hours.  Press the button if you feel a symptom. You will hear a small click. Record Date, Time and Symptom in the Patient Logbook.  When you are ready to remove the patch, follow instructions on the last 2 pages of Patient Logbook.  Stick patch monitor into the tabs at the bottom of the return box.  Place Patient Logbook in the blue and white box. Use locking tab on box and tape box closed securely. The blue and white box has prepaid postage on it. Please place it in the mailbox as soon as possible. Your physician should have your test results approximately 7-14  days after the monitor has been mailed back to [place].  Call [facility] Customer Care at [phone] if you have questions regarding your ZIO XT patch monitor.  Call them immediately if you see an orange light blinking on your monitor.  If your monitor falls off in less than 4 days, contact our Monitor department at [phone].  If your monitor becomes loose or falls off after 4 days call [employer] at [phone] for suggestions on  securing your monitor.   Follow-Up: At [facility], you and your health needs are our priority.  As part of our continuing mission to provide you with exceptional heart care, our providers are all part of one team.  This team includes your primary Cardiologist (physician) and Advanced Practice Providers or APPs (Physician Assistants and Nurse Practitioners) who all work together to provide you with the care you need, when you need it.  Your next appointment:   3 month(s)  Provider:   Mikey Fishman, PA-C

## 2024-06-01 ENCOUNTER — Other Ambulatory Visit: Payer: Self-pay

## 2024-06-07 ENCOUNTER — Other Ambulatory Visit: Payer: Self-pay | Admitting: Surgery

## 2024-06-07 DIAGNOSIS — Z953 Presence of xenogenic heart valve: Secondary | ICD-10-CM

## 2024-06-08 ENCOUNTER — Ambulatory Visit (HOSPITAL_COMMUNITY)
Admission: RE | Admit: 2024-06-08 | Discharge: 2024-06-08 | Disposition: A | Discharge status: Home / Self Care | Source: Ambulatory Visit | Attending: Internal Medicine | Admitting: Internal Medicine

## 2024-06-08 ENCOUNTER — Ambulatory Visit: Payer: Self-pay

## 2024-06-08 VITALS — BP 121/79 | HR 85 | Resp 20 | Ht 62.0 in | Wt 142.0 lb

## 2024-06-08 DIAGNOSIS — I350 Nonrheumatic aortic (valve) stenosis: Secondary | ICD-10-CM

## 2024-06-08 DIAGNOSIS — Z953 Presence of xenogenic heart valve: Secondary | ICD-10-CM | POA: Diagnosis present

## 2024-06-08 NOTE — Progress Notes (Signed)
 "     [address] [place] [address]             [phone]       HPI:  Patient returns for routine postoperative follow-up having undergone  Aortic valve replacement using a 21 mm [employer] RESILIA pericardial valve on 05/03/2024 with Dr. Lucas.  The patient's early postoperative recovery while in the hospital was notable for having atrial fibrillation and was chemically converted back to normal sinus rhythm with amiodarone . She was routinely diuresed. She was stable for discharge home on 05/10/2024.   She presents to the clinic and reports that she has been feeling much better.  She still experiences shortness of breath with specific tasks but this has improved from the previous visit.  She reports that her energy level has also started to increase.  Her pain is well-controlled without the use of any medications.  She is currently wearing a Zio patch to assess for atrial fibrillation with the hopes of being able to stop amiodarone  at her next cardiology visit.  She denies chest pain and lower leg edema.   Allergies as of 06/08/2024   No Known Allergies      Medication List        Accurate as of June 08, 2024  2:52 PM. If you have any questions, ask your nurse or doctor.          Acetaminophen  Extra Strength 500 MG Tabs Commonly known as: TYLENOL  Take 2 tablets (1,000 mg total) by mouth every 8 (eight) hours.   amiodarone  100 MG tablet Commonly known as: PACERONE  Take 1 tablet (100 mg total) by mouth daily.   aspirin  EC 325 MG tablet Take 1 tablet (325 mg total) by mouth daily.   B COMPLEX PO Take 1 tablet by mouth daily.   cyclobenzaprine  5 MG tablet Commonly known as: FLEXERIL  Take 5 mg by mouth 3 (three) times daily as needed for muscle spasms.   D3 5000 PO Take 2,000 Units by mouth daily.   furosemide  20 MG tablet Commonly known as: LASIX  Take 1 tablet (20 mg total) by mouth daily as needed.   HAIR SKIN & NAILS GUMMIES  PO Take 2 each by mouth daily.   ibuprofen 200 MG tablet Commonly known as: ADVIL Take 200-600 mg by mouth every 6 (six) hours as needed for mild pain (pain score 1-3).   metoprolol  succinate 25 MG 24 hr tablet Commonly known as: TOPROL -XL Take 0.5 tablets (12.5 mg total) by mouth daily. Take with or immediately following a meal.   Multiple Vitamins tablet Take 1 tablet by mouth daily.   nystatin  100000 UNIT/ML suspension Commonly known as: MYCOSTATIN  Take 5 mLs (500,000 Units total) by mouth 4 (four) times daily.   ondansetron  8 MG disintegrating tablet Commonly known as: ZOFRAN -ODT Take 8 mg by mouth every 8 (eight) hours as needed for nausea or vomiting.   potassium chloride  10 MEQ CR capsule Commonly known as: MICRO-K  Take 2 capsules (20 mEq total) by mouth daily.   potassium chloride  SA 20 MEQ tablet Commonly known as: KLOR-CON  M Take 1 tablet (20 mEq total) by mouth daily. For 5 days then stop.   rizatriptan  10 MG tablet Commonly known as: MAXALT  Take 1 tablet (10 mg total) by mouth every 2 (two) hours as needed. Max 2 tabs per day   traMADol  50 MG tablet Commonly known as: ULTRAM  Take 1 tablet (50 mg total) by mouth every  6 (six) hours as needed for moderate pain (pain score 4-6).         ROS Review of Systems  Constitutional:  Negative for fever and malaise/fatigue.  Respiratory:  Positive for shortness of breath. Negative for cough.        On exertion  Cardiovascular:  Negative for chest pain, palpitations and leg swelling.      BP 121/79   Pulse 85   Resp 20   Ht 5' 2 (1.575 m)   Wt 142 lb (64.4 kg)   LMP 10/21/2016   SpO2 98% Comment: RA  BMI 25.97 kg/m    Physical Exam Constitutional:      Appearance: Normal appearance.  HENT:     Head: Normocephalic and atraumatic.  Cardiovascular:     Rate and Rhythm: Normal rate and regular rhythm.     Heart sounds: Normal heart sounds, S1 normal and S2 normal.  Pulmonary:     Effort: Pulmonary  effort is normal.     Breath sounds: Normal breath sounds.  Musculoskeletal:     Cervical back: Normal range of motion.  Skin:    General: Skin is warm and dry.     Comments: Incision sites healed  Neurological:     General: No focal deficit present.     Mental Status: She is alert.       Imaging: EXAM: 2 VIEW(S) XRAY OF THE CHEST 06/08/2024 02:14:58 PM   COMPARISON: 05/18/2024   CLINICAL HISTORY: avr   FINDINGS:   LUNGS AND PLEURA: No focal pulmonary opacity. No pleural effusion. No pneumothorax.   HEART AND MEDIASTINUM: Prior median sternotomy and aortic valve repair.   BONES AND SOFT TISSUES: No acute osseous abnormality.   IMPRESSION: 1. No acute cardiopulmonary process.   Electronically signed by: Norman Gatlin MD 06/08/2024 02:26 PM EST RP Workstation: [id number]   Assessment/Plan:  S/P aortic valve replacement with bioprosthetic valve -We reviewed today's chest x ray, which shows resolution of bilateral pleural effusions. -We discussed driving and she is able to start at this time.  First time driving should be a short distance in the daytime and she can increase from there -She is cleared to participate in cardiac rehab at this time - Discussed continuance of sternal precautions until a full 6 weeks from surgery.  She should continue to not lift over 10 pounds until a full 3 months from surgery -She has been seen by cardiology and she is to continue taking aspirin , metoprolol  and Lasix  as prescribed.  Currently wearing a Zio patch with the hopes of being able to stop amiodarone  at her next follow-up visit. -Discussed need for prophylactic antibiotics for all dental procedures/cleanings and minor surgeries -She is to continue to stay active and increase as tolerated - Follow-up with [facility] as needed  Manuelita CHRISTELLA Rough, PA-C 2:52 PM 06/08/2024  "

## 2024-06-10 NOTE — Patient Instructions (Signed)
 You may continue to gradually increase your physical activity as tolerated.  Refrain from any heavy lifting or strenuous use of your arms and shoulders until at least 6 weeks from the time of your surgery, and avoid activities that cause increased pain in your chest on the side of your surgical incision.  Otherwise you may continue to increase activities without any particular limitations.  Increase the intensity and duration of physical activity gradually.   You may return to driving an automobile as long as you are no longer requiring oral narcotic pain relievers during the daytime.  It would be wise to start driving only short distances during the daylight and gradually increase from there as you feel comfortable.   Endocarditis is a potentially serious infection of heart valves or inside lining of the heart.  It occurs more commonly in patients with diseased heart valves (such as patient's with aortic or mitral valve disease) and in patients who have undergone heart valve repair or replacement.  Certain surgical and dental procedures may put you at risk, such as dental cleaning, other dental procedures, or any surgery involving the respiratory, urinary, gastrointestinal tract, gallbladder or prostate gland.   To minimize your chances for develooping endocarditis, maintain good oral health and seek prompt medical attention for any infections involving the mouth, teeth, gums, skin or urinary tract.    Always notify your doctor or dentist about your underlying heart valve condition before having any invasive procedures. You will need to take antibiotics before certain procedures, including all routine dental cleanings or other dental procedures.  Your cardiologist or dentist should prescribe these antibiotics for you to be taken ahead of time.

## 2024-06-14 ENCOUNTER — Ambulatory Visit: Attending: Cardiovascular Disease

## 2024-06-14 DIAGNOSIS — Z952 Presence of prosthetic heart valve: Secondary | ICD-10-CM | POA: Diagnosis not present

## 2024-06-14 DIAGNOSIS — I351 Nonrheumatic aortic (valve) insufficiency: Secondary | ICD-10-CM | POA: Diagnosis not present

## 2024-06-14 DIAGNOSIS — Z953 Presence of xenogenic heart valve: Secondary | ICD-10-CM | POA: Diagnosis not present

## 2024-06-14 LAB — ECHOCARDIOGRAM COMPLETE
AR max vel: 1.12 cm2
AV Area VTI: 1.09 cm2
AV Area mean vel: 1.08 cm2
AV Mean grad: 13 mmHg
AV Peak grad: 23 mmHg
Ao pk vel: 2.4 m/s
Area-P 1/2: 3.68 cm2
S' Lateral: 2.8 cm

## 2024-06-17 ENCOUNTER — Ambulatory Visit: Payer: Self-pay | Admitting: Cardiovascular Disease

## 2024-06-27 DIAGNOSIS — I4891 Unspecified atrial fibrillation: Secondary | ICD-10-CM

## 2024-06-28 ENCOUNTER — Ambulatory Visit: Payer: Self-pay | Admitting: Medical

## 2024-08-30 ENCOUNTER — Ambulatory Visit: Admitting: Medical
